# Patient Record
Sex: Female | Born: 1985 | Race: Black or African American | Hispanic: No | Marital: Single | Smoking: Current every day smoker
Health system: Southern US, Community
[De-identification: ages and names within clinical notes are randomized; demographics above are authoritative.]

## PROBLEM LIST (undated history)

## (undated) DIAGNOSIS — F909 Attention-deficit hyperactivity disorder, unspecified type: Secondary | ICD-10-CM

---
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
## 2007-02-26 ENCOUNTER — Emergency Department (HOSPITAL_COMMUNITY): Admission: EM | Admit: 2007-02-26 | Discharge: 2007-02-26 | Payer: Self-pay | Admitting: Emergency Medicine

## 2007-07-22 ENCOUNTER — Emergency Department (HOSPITAL_COMMUNITY): Admission: EM | Admit: 2007-07-22 | Discharge: 2007-07-23 | Payer: Self-pay | Admitting: Emergency Medicine

## 2007-11-04 ENCOUNTER — Emergency Department (HOSPITAL_COMMUNITY): Admission: EM | Admit: 2007-11-04 | Discharge: 2007-11-04 | Payer: Self-pay | Admitting: Emergency Medicine

## 2009-10-23 ENCOUNTER — Emergency Department (HOSPITAL_COMMUNITY): Admission: EM | Admit: 2009-10-23 | Discharge: 2009-10-23 | Payer: Self-pay | Admitting: Emergency Medicine

## 2010-05-23 ENCOUNTER — Emergency Department (HOSPITAL_COMMUNITY): Admission: EM | Admit: 2010-05-23 | Discharge: 2010-05-23 | Payer: Self-pay | Admitting: Emergency Medicine

## 2010-09-05 ENCOUNTER — Emergency Department (HOSPITAL_COMMUNITY): Admission: EM | Admit: 2010-09-05 | Discharge: 2010-09-05 | Payer: Self-pay | Admitting: Emergency Medicine

## 2011-01-07 LAB — COMPREHENSIVE METABOLIC PANEL
ALT: 15 U/L (ref 0–35)
AST: 18 U/L (ref 0–37)
Albumin: 3.7 g/dL (ref 3.5–5.2)
Calcium: 9.3 mg/dL (ref 8.4–10.5)
GFR calc Af Amer: >60 mL/min (ref >60)
Glucose, Bld: 79 mg/dL (ref 70–99)
Sodium: 140 mEq/L (ref 135–145)
Total Protein: 7.6 g/dL (ref 6.0–8.3)

## 2011-01-07 LAB — URINALYSIS, ROUTINE W REFLEX MICROSCOPIC
Glucose, UA: NEGATIVE mg/dL
Hgb urine dipstick: NEGATIVE
Ketones, ur: NEGATIVE mg/dL
pH: 5.5 (ref 5.0–8.0)

## 2011-05-28 ENCOUNTER — Emergency Department (HOSPITAL_COMMUNITY)
Admission: EM | Admit: 2011-05-28 | Discharge: 2011-05-28 | Disposition: A | Discharge status: Home / Self Care | Payer: Self-pay | Attending: Emergency Medicine | Admitting: Emergency Medicine

## 2011-05-28 DIAGNOSIS — A599 Trichomoniasis, unspecified: Secondary | ICD-10-CM | POA: Insufficient documentation

## 2011-05-28 DIAGNOSIS — D649 Anemia, unspecified: Secondary | ICD-10-CM | POA: Insufficient documentation

## 2011-05-28 DIAGNOSIS — N390 Urinary tract infection, site not specified: Secondary | ICD-10-CM | POA: Insufficient documentation

## 2011-05-28 DIAGNOSIS — F988 Other specified behavioral and emotional disorders with onset usually occurring in childhood and adolescence: Secondary | ICD-10-CM | POA: Insufficient documentation

## 2011-05-28 DIAGNOSIS — R1084 Generalized abdominal pain: Secondary | ICD-10-CM | POA: Insufficient documentation

## 2011-05-28 DIAGNOSIS — R110 Nausea: Secondary | ICD-10-CM | POA: Insufficient documentation

## 2011-05-28 DIAGNOSIS — R197 Diarrhea, unspecified: Secondary | ICD-10-CM | POA: Insufficient documentation

## 2011-05-28 LAB — COMPREHENSIVE METABOLIC PANEL
ALT: 10 U/L (ref 0–35)
AST: 12 U/L (ref 0–37)
Albumin: 3.5 g/dL (ref 3.5–5.2)
Alkaline Phosphatase: 72 U/L (ref 39–117)
BUN: 10 mg/dL (ref 6–23)
CO2: 27 mEq/L (ref 19–32)
Calcium: 9.4 mg/dL (ref 8.4–10.5)
Chloride: 104 mEq/L (ref 96–112)
Creatinine, Ser: 0.79 mg/dL (ref 0.50–1.10)
GFR calc Af Amer: >60 mL/min (ref >60)
GFR calc non Af Amer: >60 mL/min (ref >60)
Glucose, Bld: 88 mg/dL (ref 70–99)
Potassium: 3.7 mEq/L (ref 3.5–5.1)
Sodium: 138 mEq/L (ref 135–145)
Total Bilirubin: 0.4 mg/dL (ref 0.3–1.2)
Total Protein: 7.4 g/dL (ref 6.0–8.3)

## 2011-05-28 LAB — URINALYSIS, ROUTINE W REFLEX MICROSCOPIC
Glucose, UA: NEGATIVE mg/dL
Protein, ur: NEGATIVE mg/dL
Specific Gravity, Urine: 1.028 (ref 1.005–1.030)
Urobilinogen, UA: 0.2 mg/dL (ref 0.0–1.0)

## 2011-05-28 LAB — DIFFERENTIAL
Basophils Absolute: 0 10*3/uL (ref 0.0–0.1)
Basophils Relative: 0 % (ref 0–1)
Eosinophils Absolute: 0.5 10*3/uL (ref 0.0–0.7)
Eosinophils Relative: 7 % — ABNORMAL HIGH (ref 0–5)
Lymphocytes Relative: 34 % (ref 12–46)
Lymphs Abs: 2.6 10*3/uL (ref 0.7–4.0)
Monocytes Absolute: 0.5 10*3/uL (ref 0.1–1.0)
Monocytes Relative: 6 % (ref 3–12)
Neutro Abs: 4 10*3/uL (ref 1.7–7.7)
Neutrophils Relative %: 53 % (ref 43–77)

## 2011-05-28 LAB — CBC
HCT: 34.6 % — ABNORMAL LOW (ref 36.0–46.0)
Hemoglobin: 11.1 g/dL — ABNORMAL LOW (ref 12.0–15.0)
MCH: 21.6 pg — ABNORMAL LOW (ref 26.0–34.0)
MCHC: 32.1 g/dL (ref 30.0–36.0)
MCV: 67.4 fL — ABNORMAL LOW (ref 78.0–100.0)
Platelets: 202 10*3/uL (ref 150–400)
RBC: 5.13 MIL/uL — ABNORMAL HIGH (ref 3.87–5.11)
RDW: 16 % — ABNORMAL HIGH (ref 11.5–15.5)
WBC: 7.6 10*3/uL (ref 4.0–10.5)

## 2011-05-28 LAB — LIPASE, BLOOD: Lipase: 25 U/L (ref 11–59)

## 2011-05-28 LAB — URINE MICROSCOPIC-ADD ON

## 2011-07-17 LAB — URINALYSIS, ROUTINE W REFLEX MICROSCOPIC
Ketones, ur: NEGATIVE
Nitrite: NEGATIVE
Specific Gravity, Urine: 1.025
pH: 5

## 2011-07-17 LAB — URINE MICROSCOPIC-ADD ON

## 2011-11-05 ENCOUNTER — Emergency Department (HOSPITAL_COMMUNITY)
Admission: EM | Admit: 2011-11-05 | Discharge: 2011-11-05 | Disposition: A | Discharge status: Home / Self Care | Payer: Self-pay | Attending: Emergency Medicine | Admitting: Emergency Medicine

## 2011-11-05 ENCOUNTER — Encounter (HOSPITAL_COMMUNITY): Payer: Self-pay | Admitting: Neurology

## 2011-11-05 DIAGNOSIS — R109 Unspecified abdominal pain: Secondary | ICD-10-CM | POA: Insufficient documentation

## 2011-11-05 DIAGNOSIS — J111 Influenza due to unidentified influenza virus with other respiratory manifestations: Secondary | ICD-10-CM | POA: Insufficient documentation

## 2011-11-05 DIAGNOSIS — R197 Diarrhea, unspecified: Secondary | ICD-10-CM | POA: Insufficient documentation

## 2011-11-05 DIAGNOSIS — R10819 Abdominal tenderness, unspecified site: Secondary | ICD-10-CM | POA: Insufficient documentation

## 2011-11-05 DIAGNOSIS — R51 Headache: Secondary | ICD-10-CM | POA: Insufficient documentation

## 2011-11-05 DIAGNOSIS — R112 Nausea with vomiting, unspecified: Secondary | ICD-10-CM | POA: Insufficient documentation

## 2011-11-05 DIAGNOSIS — F17200 Nicotine dependence, unspecified, uncomplicated: Secondary | ICD-10-CM | POA: Insufficient documentation

## 2011-11-05 HISTORY — DX: Attention-deficit hyperactivity disorder, unspecified type: F90.9

## 2011-11-05 LAB — POCT I-STAT, CHEM 8
BUN: 12 mg/dL (ref 6–23)
Chloride: 107 mEq/L (ref 96–112)
Creatinine, Ser: 0.8 mg/dL (ref 0.50–1.10)
Glucose, Bld: 85 mg/dL (ref 70–99)
Potassium: 5.6 mEq/L — ABNORMAL HIGH (ref 3.5–5.1)
Sodium: 139 mEq/L (ref 135–145)

## 2011-11-05 LAB — URINALYSIS, ROUTINE W REFLEX MICROSCOPIC
Bilirubin Urine: NEGATIVE
Glucose, UA: NEGATIVE mg/dL
Hgb urine dipstick: NEGATIVE
Specific Gravity, Urine: 1.024 (ref 1.005–1.030)
pH: 5.5 (ref 5.0–8.0)

## 2011-11-05 LAB — DIFFERENTIAL
Basophils Absolute: 0.1 10*3/uL (ref 0.0–0.1)
Basophils Relative: 1 % (ref 0–1)
Eosinophils Relative: 11 % — ABNORMAL HIGH (ref 0–5)
Lymphs Abs: 2.2 10*3/uL (ref 0.7–4.0)
Monocytes Relative: 8 % (ref 3–12)
Neutro Abs: 2.7 10*3/uL (ref 1.7–7.7)

## 2011-11-05 LAB — CBC
HCT: 38.1 % (ref 36.0–46.0)
Hemoglobin: 12.1 g/dL (ref 12.0–15.0)
MCV: 68.2 fL — ABNORMAL LOW (ref 78.0–100.0)
RDW: 16.6 % — ABNORMAL HIGH (ref 11.5–15.5)
WBC: 6.2 10*3/uL (ref 4.0–10.5)

## 2011-11-05 LAB — URINE MICROSCOPIC-ADD ON

## 2011-11-05 MED ORDER — ONDANSETRON HCL 4 MG PO TABS: 4.0000 mg | ORAL_TABLET | Freq: Four times a day (QID) | ORAL | Status: AC

## 2011-11-05 MED ORDER — ONDANSETRON HCL 4 MG/2ML IJ SOLN
4.0000 mg | Freq: Once | INTRAMUSCULAR | Status: AC
  Administered 2011-11-05: 4 mg via INTRAVENOUS
  Filled 2011-11-05: qty 2

## 2011-11-05 MED ORDER — LOPERAMIDE HCL 2 MG PO CAPS: 2.0000 mg | ORAL_CAPSULE | Freq: Four times a day (QID) | ORAL | Status: AC | PRN

## 2011-11-05 MED ORDER — FAMOTIDINE IN NACL 20-0.9 MG/50ML-% IV SOLN
20.0000 mg | Freq: Once | INTRAVENOUS | Status: AC
  Administered 2011-11-05: 20 mg via INTRAVENOUS
  Filled 2011-11-05: qty 50

## 2011-11-05 MED ORDER — SODIUM CHLORIDE 0.9 % IV BOLUS (SEPSIS)
1000.0000 mL | Freq: Once | INTRAVENOUS | Status: AC
  Administered 2011-11-05: 1000 mL via INTRAVENOUS

## 2011-11-05 MED ORDER — IBUPROFEN 800 MG PO TABS
800.0000 mg | ORAL_TABLET | Freq: Once | ORAL | Status: AC
  Administered 2011-11-05: 800 mg via ORAL

## 2011-11-05 MED ORDER — IBUPROFEN 800 MG PO TABS
ORAL_TABLET | ORAL | Status: AC
  Filled 2011-11-05: qty 1

## 2011-11-05 NOTE — ED Notes (Signed)
Pt reports drank spoiled milk on Monday, since then c/o nausea, vomiting, headache, diarrhea. Last vomited earlier this morning, confirms diarrhea today. Today c/o headache, nausea, abdominal pain. Pt alert and oriented. NAD

## 2011-11-05 NOTE — ED Provider Notes (Signed)
History     CSN: [id number]  Arrival date & time 11/05/11  0848   First MD Initiated Contact with Patient 11/05/11 [phone]      Chief Complaint  Patient presents with  . Headache  . Abdominal Pain  . Nausea    (Consider location/radiation/quality/duration/timing/severity/associated sxs/prior treatment) Patient is a 26 y.o. female presenting with headaches and abdominal pain. The history is provided by the patient. No language interpreter was used.  Headache  This is a new problem. The current episode started 2 days ago. The problem occurs every few hours. The problem has been rapidly worsening. Associated with: drank sour milk. The pain is at a severity of 2/10. The pain is mild. The pain does not radiate. Associated symptoms include nausea and vomiting. Pertinent negatives include no fever, no near-syncope and no shortness of breath. She has tried acetaminophen for the symptoms.  Abdominal Pain The primary symptoms of the illness include abdominal pain, nausea and vomiting. The primary symptoms of the illness do not include fever or shortness of breath.   26 year old patient here today complaining of nausea vomiting and diarrhea x2 days approximately 3 times a day for each. States that she drank some sour milk 2 days ago and has not felt normal since. Having some epigastric pain 3/ 10  Especially after vomiting.  Patient has no PCP presently.denies blood in her stool or vomitus. States that she took one Tylenol yesterday that did not help her pain. States that she also has a headache with no photophobia or phonophobia, fever or neck pain. Last time she drank was yesterday denies any dysuria or vaginal discharge.    Past Medical History  Diagnosis Date  . Asthma   . ADHD (attention deficit hyperactivity disorder)     History reviewed. No pertinent past surgical history.  No family history on file.  History  Substance Use Topics  . Smoking status: Current Some Day Smoker  . Smokeless  tobacco: Not on file  . Alcohol Use: No    OB History    Grav Para Term Preterm Abortions TAB SAB Ect Mult Living                  Review of Systems  Constitutional: Negative for fever.  Respiratory: Negative for shortness of breath.   Cardiovascular: Negative for near-syncope.  Gastrointestinal: Positive for nausea, vomiting and abdominal pain.  Neurological: Positive for headaches.  All other systems reviewed and are negative.    Allergies  Review of patient's allergies indicates no known allergies.  Home Medications   Current Outpatient Rx  Name Route Sig Dispense Refill  . LOPERAMIDE HCL 2 MG PO CAPS Oral Take 1 capsule (2 mg total) by mouth 4 (four) times daily as needed for diarrhea or loose stools. 12 capsule 0  . ONDANSETRON HCL 4 MG PO TABS Oral Take 1 tablet (4 mg total) by mouth every 6 (six) hours. 12 tablet 0    BP 102/63  Pulse 78  Temp(Src) 98.3 F (36.8 C) (Oral)  Resp 16  SpO2 99%  LMP 10/13/2011  Physical Exam  Nursing note and vitals reviewed. Constitutional: She is oriented to person, place, and time. She appears well-developed and well-nourished.  HENT:  Head: Normocephalic.  Eyes: Pupils are equal, round, and reactive to light.  Neck: Normal range of motion. Neck supple.  Cardiovascular: Normal rate.  Exam reveals no gallop and no friction rub.   No murmur heard. Pulmonary/Chest: Effort normal and breath sounds normal.  Abdominal: Soft. She exhibits no distension. There is tenderness. There is no rebound.  Musculoskeletal: Normal range of motion. She exhibits no edema and no tenderness.  Neurological: She is oriented to person, place, and time.  Skin: Skin is warm and dry. No rash noted.  Psychiatric: She has a normal mood and affect.    ED Course  Procedures (including critical care time)  Labs Reviewed  CBC - Abnormal; Notable for the following:    RBC 5.59 (*)    MCV 68.2 (*)    MCH 21.6 (*)    RDW 16.6 (*)    All other  components within normal limits  DIFFERENTIAL - Abnormal; Notable for the following:    Eosinophils Relative 11 (*)    All other components within normal limits  URINALYSIS, ROUTINE W REFLEX MICROSCOPIC - Abnormal; Notable for the following:    APPearance CLOUDY (*)    Leukocytes, UA SMALL (*)    All other components within normal limits  POCT I-STAT, CHEM 8 - Abnormal; Notable for the following:    Potassium 5.6 (*)    Calcium, Ion 1.10 (*)    All other components within normal limits  URINE MICROSCOPIC-ADD ON - Abnormal; Notable for the following:    Squamous Epithelial / LPF MANY (*)    All other components within normal limits  POCT PREGNANCY, URINE  POTASSIUM  I-STAT, CHEM 8  POCT PREGNANCY, URINE  POTASSIUM      1. Influenza       MDM  12:42 PM awaiting for a redraw of her potassium which was drawn almost 2 hours ago. The lab states it was never run they're running a stat right now and we'll call the results. negative 12:42 PM Delay due to conflicting potassium lab values.  First one was high and second one not back but lab called and states that it is reading 0.  Will redraw labs.  12:42 PM   Potassium redraw results called 4.1.  Will discharge with flu-like symptoms. Labs Reviewed  CBC - Abnormal; Notable for the following:    RBC 5.59 (*)    MCV 68.2 (*)    MCH 21.6 (*)    RDW 16.6 (*)    All other components within normal limits  DIFFERENTIAL - Abnormal; Notable for the following:    Eosinophils Relative 11 (*)    All other components within normal limits  URINALYSIS, ROUTINE W REFLEX MICROSCOPIC - Abnormal; Notable for the following:    APPearance CLOUDY (*)    Leukocytes, UA SMALL (*)    All other components within normal limits  POCT I-STAT, CHEM 8 - Abnormal; Notable for the following:    Potassium 5.6 (*)    Calcium, Ion 1.10 (*)    All other components within normal limits  URINE MICROSCOPIC-ADD ON - Abnormal; Notable for the following:     Squamous Epithelial / LPF MANY (*)    All other components within normal limits  POCT PREGNANCY, URINE  POTASSIUM  I-STAT, CHEM 8  POCT PREGNANCY, URINE  POTASSIUM    Medical screening examination/treatment/procedure(s) were performed by non-physician practitioner and as supervising physician I was immediately available for consultation/collaboration. Osvaldo Human, M.D.    Jethro Bastos, NP 11/05/11 1243  Carleene Cooper III, MD 11/05/11 [phone]

## 2011-11-05 NOTE — ED Notes (Signed)
Okey Dupre, NP notified of abnormal lab test results

## 2012-03-24 ENCOUNTER — Emergency Department (HOSPITAL_COMMUNITY): Payer: Self-pay

## 2012-03-24 ENCOUNTER — Emergency Department (HOSPITAL_COMMUNITY)
Admission: EM | Admit: 2012-03-24 | Discharge: 2012-03-24 | Disposition: A | Discharge status: Home / Self Care | Payer: Self-pay | Attending: Emergency Medicine | Admitting: Emergency Medicine

## 2012-03-24 ENCOUNTER — Encounter (HOSPITAL_COMMUNITY): Payer: Self-pay | Admitting: Emergency Medicine

## 2012-03-24 DIAGNOSIS — J45909 Unspecified asthma, uncomplicated: Secondary | ICD-10-CM | POA: Insufficient documentation

## 2012-03-24 DIAGNOSIS — M62838 Other muscle spasm: Secondary | ICD-10-CM | POA: Insufficient documentation

## 2012-03-24 DIAGNOSIS — F909 Attention-deficit hyperactivity disorder, unspecified type: Secondary | ICD-10-CM | POA: Insufficient documentation

## 2012-03-24 DIAGNOSIS — M79609 Pain in unspecified limb: Secondary | ICD-10-CM | POA: Insufficient documentation

## 2012-03-24 DIAGNOSIS — M25519 Pain in unspecified shoulder: Secondary | ICD-10-CM | POA: Insufficient documentation

## 2012-03-24 MED ORDER — IBUPROFEN 800 MG PO TABS: 800.0000 mg | ORAL_TABLET | Freq: Three times a day (TID) | ORAL | Status: AC

## 2012-03-24 MED ORDER — IBUPROFEN 800 MG PO TABS
800.0000 mg | ORAL_TABLET | Freq: Once | ORAL | Status: AC
  Administered 2012-03-24: 800 mg via ORAL
  Filled 2012-03-24: qty 1

## 2012-03-24 NOTE — Progress Notes (Signed)
Orthopedic Tech Progress Note Patient Details:  Amy Sexton 02/11/1986 [id number]  Ortho Devices Type of Ortho Device: Arm foam sling Ortho Device/Splint Interventions: Application   Cammer, Mickie Bail 03/24/2012, 10:42 AM
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
## 2012-03-24 NOTE — Discharge Instructions (Signed)
Acromioclavicular Injuries  The AC (acromioclavicular) joint is the joint in the shoulder where the collarbone (clavicle) meets the shoulder blade (scapula). The part of the shoulder blade connected to the collarbone is called the acromion. Common problems with and treatments for the AC joint are detailed below.  ARTHRITIS  Arthritis occurs when the joint has been injured and the smooth padding between the joints (cartilage) is lost. This is the wear and tear seen in most joints of the body if they have been overused. This causes the joint to produce pain and swelling which is worse with activity.   AC JOINT SEPARATION  AC joint separation means that the ligaments connecting the acromion of the shoulder blade and collarbone have been damaged, and the two bones no longer line up. AC separations can be anywhere from mild to severe, and are "graded" depending upon which ligaments are torn and how badly they are torn.   Grade I Injury: the least damage is done, and the AC joint still lines up.   Grade II Injury: damage to the ligaments which reinforce the AC joint. In a Grade II injury, these ligaments are stretched but not entirely torn. When stressed, the AC joint becomes painful and unstable.   Grade III Injury: AC and secondary ligaments are completely torn, and the collarbone is no longer attached to the shoulder blade. This results in deformity; a prominence of the end of the clavicle.  AC JOINT FRACTURE  AC joint fracture means that there has been a break in the bones of the AC joint, usually the end of the clavicle.  TREATMENT  TREATMENT OF AC ARTHRITIS   There is currently no way to replace the cartilage damaged by arthritis. The best way to improve the condition is to decrease the activities which aggravate the problem. Application of ice to the joint helps decrease pain and soreness (inflammation). The use of non-steroidal anti-inflammatory medication is helpful.   If less conservative measures do not  work, then cortisone shots (injections) may be used. These are anti-inflammatories; they decrease the soreness in the joint and swelling.   If non-surgical measures fail, surgery may be recommended. The procedure is generally removal of a portion of the end of the clavicle. This is the part of the collarbone closest to your acromion which is stabilized with ligaments to the acromion of the shoulder blade. This surgery may be performed using a tube-like instrument with a light (arthroscope) for looking into a joint. It may also be performed as an open surgery through a small incision by the surgeon. Most patients will have good range of motion within 6 weeks and may return to all activity including sports by 8-12 weeks, barring complications.  TREATMENT OF AN AC SEPARATION   The initial treatment is to decrease pain. This is best accomplished by immobilizing the arm in a sling and placing an ice pack to the shoulder for 20 to 30 minutes every 2 hours as needed. As the pain starts to subside, it is important to begin moving the fingers, wrist, elbow and eventually the shoulder in order to prevent a stiff or "frozen" shoulder. Instruction on when and how much to move the shoulder will be provided by your caregiver. The length of time needed to regain full motion and function depends on the amount or grade of the injury. Recovery from a Grade I AC separation usually takes 10 to 14 days, whereas a Grade III may take 6 to 8 weeks.   Grade   I and II separations usually do not require surgery. Even Grade III injuries usually allow return to full activity with few restrictions. Treatment is also based on the activity demands of the injured shoulder. For example, a high level quarterback with an injured throwing arm will receive more aggressive treatment than someone with a desk job who rarely uses his/her arm for strenuous activities. In some cases, a painful lump may persist which could require a later surgery. Surgery  can be very successful, but the benefits must be weighed against the potential risks.  TREATMENT OF AN AC JOINT FRACTURE  Fracture treatment depends on the type of fracture. Sometimes a splint or sling may be all that is required. Other times surgery may be required for repair. This is more frequently the case when the ligaments supporting the clavicle are completely torn. Your caregiver will help you with these decisions and together you can decide what will be the best treatment.  HOME CARE INSTRUCTIONS    Apply ice to the injury for 15 to 20 minutes each hour while awake for 2 days. Put the ice in a plastic bag and place a towel between the bag of ice and skin.   If a sling has been applied, wear it constantly for as long as directed by your caregiver, even at night. The sling or splint can be removed for bathing or showering or as directed. Be sure to keep the shoulder in the same place as when the sling is on. Do not lift the arm.   If a figure-of-eight splint has been applied it should be tightened gently by another person every day. Tighten it enough to keep the shoulders held back. Allow enough room to place the index finger between the body and strap. Loosen the splint immediately if there is numbness or tingling in the hands.   Take over-the-counter or prescription medicines for pain, discomfort or fever as directed by your caregiver.   If you or your child has received a follow up appointment, it is very important to keep that appointment in order to avoid long term complications, chronic pain or disability.  SEEK MEDICAL CARE IF:    The pain is not relieved with medications.   There is increased swelling or discoloration that continues to get worse rather than better.   You or your child has been unable to follow up as instructed.   There is progressive numbness and tingling in the arm, forearm or hand.  SEEK IMMEDIATE MEDICAL CARE IF:    The arm is numb, cold or pale.   There is increasing  pain in the hand, forearm or fingers.  MAKE SURE YOU:    Understand these instructions.   Will watch your condition.   Will get help right away if you are not doing well or get worse.  Document Released: 07/23/2005 Document Revised: 10/02/2011 Document Reviewed: 01/15/2009  [employer] Patient Information 2012 [employer]
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
## 2012-03-29 NOTE — ED Provider Notes (Signed)
Medical screening examination/treatment/procedure(s) were performed by non-physician practitioner and as supervising physician I was immediately available for consultation/collaboration.  Cyndra Numbers, MD 03/29/12 [phone]

## 2013-01-30 ENCOUNTER — Encounter (HOSPITAL_COMMUNITY): Payer: Self-pay | Admitting: Physical Medicine and Rehabilitation

## 2013-01-30 ENCOUNTER — Emergency Department (HOSPITAL_COMMUNITY): Payer: Self-pay

## 2013-01-30 ENCOUNTER — Emergency Department (HOSPITAL_COMMUNITY)
Admission: EM | Admit: 2013-01-30 | Discharge: 2013-01-30 | Disposition: A | Discharge status: Home / Self Care | Payer: Self-pay | Attending: Emergency Medicine | Admitting: Emergency Medicine

## 2013-01-30 DIAGNOSIS — Y9389 Activity, other specified: Secondary | ICD-10-CM | POA: Insufficient documentation

## 2013-01-30 DIAGNOSIS — J45909 Unspecified asthma, uncomplicated: Secondary | ICD-10-CM | POA: Insufficient documentation

## 2013-01-30 DIAGNOSIS — IMO0002 Reserved for concepts with insufficient information to code with codable children: Secondary | ICD-10-CM | POA: Insufficient documentation

## 2013-01-30 DIAGNOSIS — S6990XA Unspecified injury of unspecified wrist, hand and finger(s), initial encounter: Secondary | ICD-10-CM | POA: Insufficient documentation

## 2013-01-30 DIAGNOSIS — M79642 Pain in left hand: Secondary | ICD-10-CM

## 2013-01-30 DIAGNOSIS — F17200 Nicotine dependence, unspecified, uncomplicated: Secondary | ICD-10-CM | POA: Insufficient documentation

## 2013-01-30 DIAGNOSIS — Y929 Unspecified place or not applicable: Secondary | ICD-10-CM | POA: Insufficient documentation

## 2013-01-30 DIAGNOSIS — Z8659 Personal history of other mental and behavioral disorders: Secondary | ICD-10-CM | POA: Insufficient documentation

## 2013-01-30 DIAGNOSIS — Z23 Encounter for immunization: Secondary | ICD-10-CM | POA: Insufficient documentation

## 2013-01-30 DIAGNOSIS — X58XXXA Exposure to other specified factors, initial encounter: Secondary | ICD-10-CM | POA: Insufficient documentation

## 2013-01-30 MED ORDER — NAPROXEN SODIUM 220 MG PO TABS: 220.0000 mg | ORAL_TABLET | Freq: Two times a day (BID) | ORAL | Status: DC

## 2013-01-30 MED ORDER — TETANUS-DIPHTH-ACELL PERTUSSIS 5-2.5-18.5 LF-MCG/0.5 IM SUSP
0.5000 mL | Freq: Once | INTRAMUSCULAR | Status: AC
  Administered 2013-01-30: 0.5 mL via INTRAMUSCULAR
  Filled 2013-01-30: qty 0.5

## 2013-01-30 NOTE — ED Provider Notes (Signed)
History     CSN: [id number]  Arrival date & time 01/30/13  4098   First MD Initiated Contact with Patient 01/30/13 [phone]      Chief Complaint  Patient presents with  . Hand Pain    (Consider location/radiation/quality/duration/timing/severity/associated sxs/prior treatment) HPI Comments: Patient p/w pain in her left hand and 4th finger that began two days ago.  Pain is located over dorsal and palmar surfaces of hand, throughout 4th finger and 4th metacarpal, worse with full flexion of finger.  States she cut the tip of her finger AFTER the pain began (she reports she is positive about this timeline).  Pain is described as throbbing, does not radiate, 8/10 intensity.  Denies weakness or numbness.  Denies swelling. Denies wrist pain.  Unsure of last tetanus.  States she does heavy lifting at work but does not remember specific incident that might have caused injury.   Patient is a 27 y.o. female presenting with hand pain. The history is provided by the patient.  Hand Pain Pertinent negatives include no joint swelling, numbness, rash or weakness.    Past Medical History  Diagnosis Date  . Asthma   . ADHD (attention deficit hyperactivity disorder)     No past surgical history on file.  No family history on file.  History  Substance Use Topics  . Smoking status: Current Some Day Smoker  . Smokeless tobacco: Not on file  . Alcohol Use: No    OB History   Grav Para Term Preterm Abortions TAB SAB Ect Mult Living                  Review of Systems  Musculoskeletal: Negative for joint swelling.  Skin: Positive for wound. Negative for color change, pallor and rash.  Neurological: Negative for weakness and numbness.    Allergies  Review of patient's allergies indicates no known allergies.  Home Medications  No current outpatient prescriptions on file.  BP 133/90  Pulse 82  Temp(Src) 98.7 F (37.1 C) (Oral)  Resp 18  SpO2 99%  Physical Exam  Nursing note and vitals  reviewed. Constitutional: She appears well-developed and well-nourished. No distress.  HENT:  Head: Normocephalic and atraumatic.  Neck: Neck supple.  Pulmonary/Chest: Effort normal.  Musculoskeletal:       Hands: Pt with full active flexion and extension of 4th finger.  Pain increases with passive full flexion of finger.  Sensation intact, capillary refill < 2 seconds.  No erythema, edema, or warmth.    Neurological: She is alert.  Skin: She is not diaphoretic.    ED Course  Procedures (including critical care time)  Labs Reviewed - No data to display Dg Hand Complete Left  01/30/2013  *RADIOLOGY REPORT*  Clinical Data: Left hand pain.  LEFT HAND - COMPLETE 3+ VIEW  Comparison:  None.  Findings:  There is no evidence of fracture or dislocation.  There is no evidence of arthropathy or other focal bone abnormality. Soft tissues are unremarkable.  IMPRESSION: Negative.   Original Report Authenticated By: Irish Lack, M.D.     8:59 AM Pt seen and examined.  Mildly tender diffusely through 4th finger and metacarpal.  No e/o infection. Pt declines medication currently.  Xray pending.   1. Left hand pain     MDM  Pt with mild tenderness over dorsal and palmar aspect of left hand, following 4th metacarpal into proximal digit.  No inflammation over joint.  No erythema, edema, warmth.  Doubt infection including tenosynovitis.  Doubt septic joint. Pt does have small abrasion distally on same digit but this does not appear infected either.   Pt has full AROM of digits.  Will place in ACE wrap for comfort.  Discussed all results with patient.  Pt given return precautions.  Pt verbalizes understanding and agrees with plan.           Trixie Dredge, PA-C 01/30/13 1044

## 2013-01-30 NOTE — ED Notes (Signed)
Pt presents to department for evaluation of L hand and ring finger pain. Onset Friday. States she is unsure if extremity was injured while lifting heavy equipment at work. 8/10 pain upon arrival. CMS intact. Pt is alert and oriented x4. Ambulatory to triage.

## 2013-01-31 NOTE — ED Provider Notes (Signed)
Medical screening examination/treatment/procedure(s) were performed by non-physician practitioner and as supervising physician I was immediately available for consultation/collaboration.   Carleene Cooper III, MD 01/31/13 1226

## 2013-06-27 ENCOUNTER — Emergency Department (HOSPITAL_COMMUNITY)
Admission: EM | Admit: 2013-06-27 | Discharge: 2013-06-27 | Disposition: A | Discharge status: Home / Self Care | Payer: Self-pay | Attending: Emergency Medicine | Admitting: Emergency Medicine

## 2013-06-27 ENCOUNTER — Emergency Department (HOSPITAL_COMMUNITY): Payer: Self-pay

## 2013-06-27 ENCOUNTER — Encounter (HOSPITAL_COMMUNITY): Payer: Self-pay | Admitting: *Deleted

## 2013-06-27 DIAGNOSIS — J45909 Unspecified asthma, uncomplicated: Secondary | ICD-10-CM | POA: Insufficient documentation

## 2013-06-27 DIAGNOSIS — Z8659 Personal history of other mental and behavioral disorders: Secondary | ICD-10-CM | POA: Insufficient documentation

## 2013-06-27 DIAGNOSIS — R51 Headache: Secondary | ICD-10-CM | POA: Insufficient documentation

## 2013-06-27 DIAGNOSIS — Z3202 Encounter for pregnancy test, result negative: Secondary | ICD-10-CM | POA: Insufficient documentation

## 2013-06-27 DIAGNOSIS — R079 Chest pain, unspecified: Secondary | ICD-10-CM

## 2013-06-27 DIAGNOSIS — R519 Headache, unspecified: Secondary | ICD-10-CM

## 2013-06-27 DIAGNOSIS — R072 Precordial pain: Secondary | ICD-10-CM | POA: Insufficient documentation

## 2013-06-27 DIAGNOSIS — F17200 Nicotine dependence, unspecified, uncomplicated: Secondary | ICD-10-CM | POA: Insufficient documentation

## 2013-06-27 LAB — CBC
HCT: 36.8 % (ref 36.0–46.0)
Hemoglobin: 12.3 g/dL (ref 12.0–15.0)
MCH: 22.8 pg — ABNORMAL LOW (ref 26.0–34.0)
MCHC: 33.4 g/dL (ref 30.0–36.0)
RDW: 16.4 % — ABNORMAL HIGH (ref 11.5–15.5)

## 2013-06-27 LAB — BASIC METABOLIC PANEL
BUN: 6 mg/dL (ref 6–23)
Calcium: 9.3 mg/dL (ref 8.4–10.5)
Creatinine, Ser: 0.9 mg/dL (ref 0.50–1.10)
GFR calc Af Amer: >90 mL/min (ref >90)
GFR calc non Af Amer: 86 mL/min — ABNORMAL LOW (ref >90)
Glucose, Bld: 100 mg/dL — ABNORMAL HIGH (ref 70–99)

## 2013-06-27 MED ORDER — METOCLOPRAMIDE HCL 5 MG/ML IJ SOLN
10.0000 mg | Freq: Once | INTRAMUSCULAR | Status: AC
  Administered 2013-06-27: 10 mg via INTRAVENOUS
  Filled 2013-06-27: qty 2

## 2013-06-27 MED ORDER — DIPHENHYDRAMINE HCL 50 MG/ML IJ SOLN
25.0000 mg | Freq: Once | INTRAMUSCULAR | Status: AC
  Administered 2013-06-27: 25 mg via INTRAVENOUS
  Filled 2013-06-27: qty 1

## 2013-06-27 MED ORDER — KETOROLAC TROMETHAMINE 30 MG/ML IJ SOLN
30.0000 mg | Freq: Once | INTRAMUSCULAR | Status: AC
  Administered 2013-06-27: 30 mg via INTRAVENOUS
  Filled 2013-06-27: qty 1

## 2013-06-27 NOTE — ED Provider Notes (Signed)
CSN: [id number]     Arrival date & time 06/27/13  1322 History   First MD Initiated Contact with Patient 06/27/13 1431     Chief Complaint  Patient presents with  . Chest Pain  . Headache   (Consider location/radiation/quality/duration/timing/severity/associated sxs/prior Treatment) HPI Comments: 27 yo female with intermittent chest ache for 4 days, lasting minutes to hours.  Non radiating.  Pt has had similar in the past. NO known heart or lung hx.  Patient denies blood clot history, active cancer, recent major trauma or surgery, unilateral leg swelling/ pain, recent long travel, hemoptysis or oral contraceptives.  No diaphoresis or exertional sxs.  Mild frontal HA, intermittent, similar to previous ha hx, gradual onset.  Mild worsening at times with eating.  Pt feels well otherwise. HA and cp do not occur together, no neck pain.  Patient is a 27 y.o. female presenting with chest pain and headaches. The history is provided by the patient.  Chest Pain Pain location:  Substernal area Associated symptoms: headache   Associated symptoms: no abdominal pain, no back pain, no fever, no numbness, no shortness of breath, not vomiting and no weakness   Headache Associated symptoms: no abdominal pain, no back pain, no fever, no neck pain, no neck stiffness, no numbness and no vomiting     Past Medical History  Diagnosis Date  . Asthma   . ADHD (attention deficit hyperactivity disorder)    History reviewed. No pertinent past surgical history. No family history on file. History  Substance Use Topics  . Smoking status: Current Some Day Smoker  . Smokeless tobacco: Not on file  . Alcohol Use: No   OB History   Grav Para Term Preterm Abortions TAB SAB Ect Mult Living                 Review of Systems  Constitutional: Negative for fever and chills.  HENT: Negative for neck pain and neck stiffness.   Eyes: Negative for visual disturbance.  Respiratory: Negative for shortness of breath.    Cardiovascular: Positive for chest pain.  Gastrointestinal: Negative for vomiting and abdominal pain.  Genitourinary: Negative for dysuria and flank pain.  Musculoskeletal: Negative for back pain.  Skin: Negative for rash.  Neurological: Positive for headaches. Negative for weakness, light-headedness and numbness.    Allergies  Review of patient's allergies indicates no known allergies.  Home Medications  No current outpatient prescriptions on file. BP 124/81  Pulse 86  Temp(Src) 98.3 F (36.8 C) (Oral)  Resp 18  SpO2 99%  LMP 06/20/2013 Physical Exam  Nursing note and vitals reviewed. Constitutional: She is oriented to person, place, and time. She appears well-developed and well-nourished.  HENT:  Head: Normocephalic and atraumatic.  Eyes: Conjunctivae are normal. Right eye exhibits no discharge. Left eye exhibits no discharge.  Neck: Normal range of motion. Neck supple. No tracheal deviation present.  Cardiovascular: Normal rate, regular rhythm and intact distal pulses.   Pulmonary/Chest: Effort normal and breath sounds normal.  Abdominal: Soft. She exhibits no distension. There is no tenderness. There is no guarding.  Musculoskeletal: She exhibits no edema.  Neurological: She is alert and oriented to person, place, and time. She has normal strength. No cranial nerve deficit or sensory deficit. Coordination normal. GCS eye subscore is 4. GCS verbal subscore is 5. GCS motor subscore is 6.  Skin: Skin is warm. No rash noted.  Psychiatric: She has a normal mood and affect.    ED Course  Procedures (including critical  care time) Labs Review Labs Reviewed  CBC - Abnormal; Notable for the following:    RBC 5.40 (*)    MCV 68.1 (*)    MCH 22.8 (*)    RDW 16.4 (*)    All other components within normal limits  BASIC METABOLIC PANEL - Abnormal; Notable for the following:    Glucose, Bld 100 (*)    GFR calc non Af Amer 86 (*)    All other components within normal limits   TROPONIN I  POCT I-STAT TROPONIN I   Imaging Review Dg Chest 2 View  06/27/2013   *RADIOLOGY REPORT*  Clinical Data: Chest pain and had a  CHEST - 2 VIEW  Comparison: 09/05/2010  Findings:  The heart size and vascular pattern are normal.  The lungs are clear.  No change from prior study.  IMPRESSION: Negative   Original Report Authenticated By: Esperanza Heir, M.D.    MDM  No diagnosis found. No FH of cardiac/  Well appearing.  Atypical sxs.  Pt very low risk cardiac and PE. PERC neg, no indication for CT.  No sxs in ED.   EKG and cxr not acute findings, reviewed.  Date: 06/27/2013  Rate: 77  Rhythm: normal sinus rhythm  QRS Axis: normal  Intervals: normal  ST/T Wave abnormalities: nonspecific ST changes  Conduction Disutrbances:none  Narrative Interpretation:   Old EKG Reviewed: none available  Plan for close outpt fup discussed.      Enid Skeens, MD 06/27/13 1540

## 2013-06-27 NOTE — ED Notes (Signed)
Pt is here with chest pain for 4 days intermittently.  Pt states pain to mid chest and no radiation.  Pt reports migraine pain intermittently with left eye bothering her.  Pt reports left eye is blurry.

## 2013-08-19 ENCOUNTER — Other Ambulatory Visit: Payer: Self-pay | Admitting: Internal Medicine

## 2013-08-19 ENCOUNTER — Ambulatory Visit
Admission: RE | Admit: 2013-08-19 | Discharge: 2013-08-19 | Disposition: A | Discharge status: Home / Self Care | Payer: Worker's Compensation | Source: Ambulatory Visit | Attending: Internal Medicine | Admitting: Internal Medicine

## 2013-08-19 DIAGNOSIS — S060X9A Concussion with loss of consciousness of unspecified duration, initial encounter: Secondary | ICD-10-CM

## 2013-08-19 DIAGNOSIS — M5412 Radiculopathy, cervical region: Secondary | ICD-10-CM

## 2013-10-10 ENCOUNTER — Emergency Department (HOSPITAL_COMMUNITY)
Admission: EM | Admit: 2013-10-10 | Discharge: 2013-10-10 | Disposition: A | Discharge status: Home / Self Care | Payer: Self-pay | Attending: Emergency Medicine | Admitting: Emergency Medicine

## 2013-10-10 ENCOUNTER — Emergency Department (HOSPITAL_COMMUNITY): Payer: Self-pay

## 2013-10-10 ENCOUNTER — Encounter (HOSPITAL_COMMUNITY): Payer: Self-pay | Admitting: Emergency Medicine

## 2013-10-10 DIAGNOSIS — F17200 Nicotine dependence, unspecified, uncomplicated: Secondary | ICD-10-CM | POA: Insufficient documentation

## 2013-10-10 DIAGNOSIS — M940 Chondrocostal junction syndrome [Tietze]: Secondary | ICD-10-CM | POA: Insufficient documentation

## 2013-10-10 DIAGNOSIS — Z79899 Other long term (current) drug therapy: Secondary | ICD-10-CM | POA: Insufficient documentation

## 2013-10-10 DIAGNOSIS — F909 Attention-deficit hyperactivity disorder, unspecified type: Secondary | ICD-10-CM | POA: Insufficient documentation

## 2013-10-10 DIAGNOSIS — J45909 Unspecified asthma, uncomplicated: Secondary | ICD-10-CM | POA: Insufficient documentation

## 2013-10-10 DIAGNOSIS — J069 Acute upper respiratory infection, unspecified: Secondary | ICD-10-CM | POA: Insufficient documentation

## 2013-10-10 LAB — CBC
HCT: 37.7 % (ref 36.0–46.0)
MCHC: 32.4 g/dL (ref 30.0–36.0)
MCV: 67.9 fL — ABNORMAL LOW (ref 78.0–100.0)
RDW: 16.6 % — ABNORMAL HIGH (ref 11.5–15.5)
WBC: 6 10*3/uL (ref 4.0–10.5)

## 2013-10-10 LAB — BASIC METABOLIC PANEL
BUN: 9 mg/dL (ref 6–23)
Creatinine, Ser: 0.84 mg/dL (ref 0.50–1.10)
GFR calc Af Amer: >90 mL/min (ref >90)
GFR calc non Af Amer: >90 mL/min (ref >90)

## 2013-10-10 MED ORDER — ALBUTEROL SULFATE HFA 108 (90 BASE) MCG/ACT IN AERS
2.0000 | INHALATION_SPRAY | Freq: Once | RESPIRATORY_TRACT | Status: AC
  Administered 2013-10-10: 2 via RESPIRATORY_TRACT
  Filled 2013-10-10: qty 6.7

## 2013-10-10 MED ORDER — KETOROLAC TROMETHAMINE 30 MG/ML IJ SOLN: 30.0000 mg | Freq: Once | INTRAMUSCULAR | Status: DC

## 2013-10-10 MED ORDER — KETOROLAC TROMETHAMINE 30 MG/ML IJ SOLN
60.0000 mg | Freq: Once | INTRAMUSCULAR | Status: AC
  Administered 2013-10-10: 60 mg via INTRAMUSCULAR
  Filled 2013-10-10: qty 2

## 2013-10-10 MED ORDER — IBUPROFEN 600 MG PO TABS: 600.0000 mg | ORAL_TABLET | Freq: Four times a day (QID) | ORAL | Status: DC | PRN

## 2013-10-10 NOTE — ED Notes (Signed)
Pt arrived from triage to room 33

## 2013-10-10 NOTE — ED Notes (Signed)
Pt c/o intermittent SSCP x 2 months but pain has been constant past 3 days. Denies SOB, n/v, diaphoresis, fever, chills. Does report pain worse with deep breaths, coughing & is tender to palpation

## 2013-10-10 NOTE — ED Provider Notes (Signed)
CSN: [id number]     Arrival date & time 10/10/13  1347 History   First MD Initiated Contact with Patient 10/10/13 1818     Chief Complaint  Patient presents with  . Chest Pain   (Consider location/radiation/quality/duration/timing/severity/associated sxs/prior Treatment) HPI Amy Sexton is a 27 y.o. female who presents to the emergency department for concern of chest pain.  Patient reports that this has been going on and off for the last month.  Tends to be worse with deep breathing or coughing.  Better with nothing.  Nonexertional.  Sharp.  8/10.  No SOB.  No leg swelling.  No hemoptysis.  No history of DVT/PE.  No other symptoms.  Past Medical History  Diagnosis Date  . Asthma   . ADHD (attention deficit hyperactivity disorder)    History reviewed. No pertinent past surgical history. History reviewed. No pertinent family history. History  Substance Use Topics  . Smoking status: Current Some Day Smoker  . Smokeless tobacco: Not on file  . Alcohol Use: No   OB History   Grav Para Term Preterm Abortions TAB SAB Ect Mult Living                 Review of Systems  Constitutional: Negative for chills.  HENT: Negative for congestion and rhinorrhea.   Gastrointestinal: Negative for diarrhea and abdominal distention.  Endocrine: Negative for polyuria.  Genitourinary: Negative for dysuria.  Musculoskeletal: Negative for neck pain and neck stiffness.  Skin: Negative for rash.  Psychiatric/Behavioral: Negative.     Allergies  Review of patient's allergies indicates no known allergies.  Home Medications   Current Outpatient Rx  Name  Route  Sig  Dispense  Refill  . Aspirin-Acetaminophen-Caffeine (GOODYS EXTRA STRENGTH PO)   Oral   Take 2-3 packets by mouth every 6 (six) hours as needed (headache).          BP 108/75  Pulse 99  Temp(Src) 98.4 F (36.9 C) (Oral)  Resp 16  SpO2 97%  LMP 09/27/2013 Physical Exam  Nursing note and vitals reviewed. Constitutional:  She is oriented to person, place, and time. She appears well-developed and well-nourished. No distress.  HENT:  Head: Normocephalic and atraumatic.  Right Ear: External ear normal.  Left Ear: External ear normal.  Nose: Nose normal.  Mouth/Throat: Oropharynx is clear and moist. No oropharyngeal exudate.  Eyes: EOM are normal. Pupils are equal, round, and reactive to light.  Neck: Normal range of motion. Neck supple. No tracheal deviation present.  Cardiovascular: Normal rate.   Pulmonary/Chest: Effort normal and breath sounds normal. No stridor. No respiratory distress. She has no wheezes. She has no rales.  Abdominal: Soft. She exhibits no distension. There is no tenderness. There is no rebound.  Musculoskeletal: Normal range of motion.  Neurological: She is alert and oriented to person, place, and time.  Skin: Skin is warm and dry. She is not diaphoretic.    ED Course  Procedures (including critical care time) Labs Review Labs Reviewed  CBC - Abnormal; Notable for the following:    RBC 5.55 (*)    MCV 67.9 (*)    MCH 22.0 (*)    RDW 16.6 (*)    All other components within normal limits  BASIC METABOLIC PANEL  POCT I-STAT TROPONIN I   Imaging Review Dg Chest 2 View  10/10/2013   CLINICAL DATA:  Pain; history of asthma  EXAM: CHEST  2 VIEW  COMPARISON:  June 27, 2013  FINDINGS: The lungs are clear.  The heart size and pulmonary vascularity are normal. No adenopathy. No pneumothorax. No bone lesions.  IMPRESSION: No abnormality noted.   Electronically Signed   By: Bretta Bang M.D.   On: 10/10/2013 14:36    EKG Interpretation   None       MDM   1. Costochondritis     Amy Sexton is a 27 y.o. female who presents to the emergency department for concern of pleuritic chest pain x 1 month.  Exam with very reproducible pain on sternal palpation.  Doubt ACS given age and lack of risk factors.  Patient is PERC negative.  CXR negative for ptx, pneumonia, widened  mediastinum or other acute abnormality.  Most c/w URI with costochondritis.  Patient safe for discharge home.  Will treat with high dose NSAIDs.  Patient discharged.    Arloa Koh, MD 10/10/13 2029

## 2013-10-10 NOTE — ED Notes (Signed)
Pt updated on wait time.  

## 2013-10-10 NOTE — ED Notes (Signed)
Pt c/o CP , headache, and feeling dizzy since yesterday. States she thought if she rested all night the symptoms would get better but they did not. A&ox4, resp e/u

## 2013-10-11 NOTE — ED Provider Notes (Addendum)
Medical screening examination/treatment/procedure(s) were conducted as a shared visit with non-physician practitioner(s) or resident  and myself.  I personally evaluated the patient during the encounter and agree with the findings and plan unless otherwise indicated.    I have personally reviewed any xrays and/ or EKG's with the provider and I agree with interpretation.   Anterior chest pain with coughing and palpation, recent URI, Patient denies blood clot history, active cancer, recent major trauma or surgery, unilateral leg swelling/ pain, recent long travel, hemoptysis or oral contraceptives.  Lungs clear, abd soft/ NT, tender anterior ribs moderate/ reproduceable on exam, RRR no murmurs, no leg pain or swellingwell appearing.  Costochondritis, URI   Enid Skeens, MD 10/11/13 0158  Pt had an EKG in triage done, clinically not cardiac however EKG showed mild t wave inversions inferior and lateral which appear new compared to previous.  Troponin neg.  Plan to call patient at home to ensure close fup outpatient and reasons to return since ekg was abnormal.    Enid Skeens, MD 10/11/13 0205  Spoke with patient, she feels fine.  Discussed abnormal EKG and to fup closely outpt for possible echo vs stress, strict reasons to return given.  Pt comfortable with plan.  Pt is low risk, only risk factor is smoking.   Enid Skeens, MD 10/11/13 [phone]

## 2013-12-24 ENCOUNTER — Encounter (HOSPITAL_COMMUNITY): Payer: Self-pay | Admitting: Emergency Medicine

## 2013-12-24 ENCOUNTER — Emergency Department (HOSPITAL_COMMUNITY)
Admission: EM | Admit: 2013-12-24 | Discharge: 2013-12-24 | Disposition: A | Discharge status: Home / Self Care | Payer: Self-pay | Attending: Emergency Medicine | Admitting: Emergency Medicine

## 2013-12-24 DIAGNOSIS — E860 Dehydration: Secondary | ICD-10-CM | POA: Insufficient documentation

## 2013-12-24 DIAGNOSIS — J020 Streptococcal pharyngitis: Secondary | ICD-10-CM | POA: Insufficient documentation

## 2013-12-24 DIAGNOSIS — Z8659 Personal history of other mental and behavioral disorders: Secondary | ICD-10-CM | POA: Insufficient documentation

## 2013-12-24 DIAGNOSIS — J45909 Unspecified asthma, uncomplicated: Secondary | ICD-10-CM | POA: Insufficient documentation

## 2013-12-24 DIAGNOSIS — F17200 Nicotine dependence, unspecified, uncomplicated: Secondary | ICD-10-CM | POA: Insufficient documentation

## 2013-12-24 DIAGNOSIS — R52 Pain, unspecified: Secondary | ICD-10-CM | POA: Insufficient documentation

## 2013-12-24 LAB — RAPID STREP SCREEN (MED CTR MEBANE ONLY): STREPTOCOCCUS, GROUP A SCREEN (DIRECT): POSITIVE — AB

## 2013-12-24 MED ORDER — LIDOCAINE VISCOUS 2 % MT SOLN: 20.0000 mL | OROMUCOSAL | Status: DC | PRN

## 2013-12-24 MED ORDER — AMOXICILLIN 500 MG PO CAPS: 500.0000 mg | ORAL_CAPSULE | Freq: Three times a day (TID) | ORAL | Status: DC

## 2013-12-24 MED ORDER — LIDOCAINE VISCOUS 2 % MT SOLN
15.0000 mL | Freq: Once | OROMUCOSAL | Status: AC
  Administered 2013-12-24: 15 mL via OROMUCOSAL
  Filled 2013-12-24: qty 15

## 2013-12-24 MED ORDER — DEXAMETHASONE SODIUM PHOSPHATE 10 MG/ML IJ SOLN
10.0000 mg | Freq: Once | INTRAMUSCULAR | Status: AC
  Administered 2013-12-24: 10 mg via INTRAMUSCULAR
  Filled 2013-12-24: qty 1

## 2013-12-24 NOTE — ED Notes (Signed)
Pt reports sore throat x 1 week. C/o cough and body aches  X 1 week

## 2013-12-24 NOTE — Discharge Instructions (Signed)
Strep Throat  Strep throat is an infection of the throat caused by a bacteria named Streptococcus pyogenes. Your caregiver may call the infection streptococcal "tonsillitis" or "pharyngitis" depending on whether there are signs of inflammation in the tonsils or back of the throat. Strep throat is most common in children aged 29 15 years during the cold months of the year, but it can occur in people of any age during any season. This infection is spread from person to person (contagious) through coughing, sneezing, or other close contact.  SYMPTOMS   · Fever or chills.  · Painful, swollen, red tonsils or throat.  · Pain or difficulty when swallowing.  · White or yellow spots on the tonsils or throat.  · Swollen, tender lymph nodes or "glands" of the neck or under the jaw.  · Red rash all over the body (rare).  DIAGNOSIS   Many different infections can cause the same symptoms. A test must be done to confirm the diagnosis so the right treatment can be given. A "rapid strep test" can help your caregiver make the diagnosis in a few minutes. If this test is not available, a light swab of the infected area can be used for a throat culture test. If a throat culture test is done, results are usually available in a day or two.  TREATMENT   Strep throat is treated with antibiotic medicine.  HOME CARE INSTRUCTIONS   · Gargle with 1 tsp of salt in 1 cup of warm water, 3 4 times per day or as needed for comfort.  · Family members who also have a sore throat or fever should be tested for strep throat and treated with antibiotics if they have the strep infection.  · Make sure everyone in your household washes their hands well.  · Do not share food, drinking cups, or personal items that could cause the infection to spread to others.  · You may need to eat a soft food diet until your sore throat gets better.  · Drink enough water and fluids to keep your urine clear or pale yellow. This will help prevent dehydration.  · Get plenty of  rest.  · Stay home from school, daycare, or work until you have been on antibiotics for 24 hours.  · Only take over-the-counter or prescription medicines for pain, discomfort, or fever as directed by your caregiver.  · If antibiotics are prescribed, take them as directed. Finish them even if you start to feel better.  SEEK MEDICAL CARE IF:   · The glands in your neck continue to enlarge.  · You develop a rash, cough, or earache.  · You cough up green, yellow-brown, or bloody sputum.  · You have pain or discomfort not controlled by medicines.  · Your problems seem to be getting worse rather than better.  SEEK IMMEDIATE MEDICAL CARE IF:   · You develop any new symptoms such as vomiting, severe headache, stiff or painful neck, chest pain, shortness of breath, or trouble swallowing.  · You develop severe throat pain, drooling, or changes in your voice.  · You develop swelling of the neck, or the skin on the neck becomes red and tender.  · You have a fever.  · You develop signs of dehydration, such as fatigue, dry mouth, and decreased urination.  · You become increasingly sleepy, or you cannot wake up completely.  Document Released: 10/10/2000 Document Revised: 09/29/2012 Document Reviewed: 12/12/2010  [employer]® Patient Information ©2014 [employer].

## 2013-12-24 NOTE — ED Provider Notes (Signed)
CSN: [id number]     Arrival date & time 12/24/13  1310 History  This chart was scribed for non-physician practitioner, Arthor Captain, PA-C,working with Audree Camel, MD, by Karle Plumber, ED Scribe.  This patient was seen in room WTR5/WTR5 and the patient's care was started at 2:17 PM.  Chief Complaint  Patient presents with  . Sore Throat    pain in throat x 1 week   The history is provided by the patient. No language interpreter was used.   HPI Comments:  Amy Sexton is a 28 y.o. female with h/o asthma who presents to the Emergency Department complaining of a sore throat that started three days ago. She reports associated cough and generalized body aches. Pt states upon waking on Thursday morning she felt as if her throat was swollen. She states she has not been able to eat anything due to the pain. She denies fever. Pt states she is a smoker.   Past Medical History  Diagnosis Date  . Asthma   . ADHD (attention deficit hyperactivity disorder)    No past surgical history on file. No family history on file. History  Substance Use Topics  . Smoking status: Current Some Day Smoker  . Smokeless tobacco: Not on file  . Alcohol Use: No   OB History   Grav Para Term Preterm Abortions TAB SAB Ect Mult Living                 Review of Systems  Constitutional: Negative for fever.  HENT: Positive for sore throat.   All other systems reviewed and are negative.   Allergies  Review of patient's allergies indicates no known allergies.  Home Medications   Current Outpatient Rx  Name  Route  Sig  Dispense  Refill  . Aspirin-Acetaminophen-Caffeine (GOODYS EXTRA STRENGTH PO)   Oral   Take 2-3 packets by mouth every 6 (six) hours as needed (headache).         Marland Kitchen ibuprofen (ADVIL,MOTRIN) 600 MG tablet   Oral   Take 1 tablet (600 mg total) by mouth every 6 (six) hours as needed.   30 tablet   0    Triage Vitals: BP 114/94  Pulse 102  Temp(Src) 98.2 F (36.8 C) (Oral)   Resp 20  Wt 210 lb (95.255 kg)  LMP 11/27/2013 Physical Exam  Nursing note and vitals reviewed. Constitutional: She is oriented to person, place, and time. She appears well-developed and well-nourished. No distress.  HENT:  Head: Normocephalic and atraumatic.  Mouth/Throat: Uvula is midline. No oropharyngeal exudate, posterior oropharyngeal edema or tonsillar abscesses.  Large, inflamed, erythematous uvula. Tonsil are not grossly swollen. Tender to palpation.  Eyes: EOM are normal.  Neck: Normal range of motion.  Cardiovascular: Normal rate.   Pulmonary/Chest: Effort normal. She has rhonchi.  Musculoskeletal: Normal range of motion.  Lymphadenopathy:    She has cervical adenopathy (bilateral).  Neurological: She is alert and oriented to person, place, and time.  Skin: Skin is warm and dry.  Psychiatric: She has a normal mood and affect. Her behavior is normal.    ED Course  Procedures (including critical care time) DIAGNOSTIC STUDIES:   COORDINATION OF CARE: 2:22 PM- Will give an injection of dexamethasone. Will prescribe antibiotic. Pt verbalizes understanding and agrees to plan.  Medications  dexamethasone (DECADRON) injection 10 mg (not administered)  lidocaine (XYLOCAINE) 2 % viscous mouth solution 15 mL (not administered)    Labs Review Labs Reviewed  RAPID STREP SCREEN - Abnormal;  Notable for the following:    Streptococcus, Group A Screen (Direct) POSITIVE (*)    All other components within normal limits   Imaging Review No results found.   EKG Interpretation None      MDM   Final diagnoses:  Strep pharyngitis    Pt afebrile with tonsillar exudate, cervical lymphadenopathy, & dysphagia; diagnosis of strep.   Pt appears mildly dehydrated, discussed importance of water rehydration. Presentation non concerning for PTA or infxn spread to soft tissue. No trismus or uvula deviation.  Amoxil and lidocaine at d.c.Specific return precautions discussed. Pt able  to drink water in ED without difficulty with intact air way. Recommended PCP follow up.    I personally performed the services described in this documentation, which was scribed in my presence. The recorded information has been reviewed and is accurate.    Arthor CaptainAbigail Ladawna Walgren, PA-C 12/28/13 1355

## 2013-12-30 NOTE — ED Provider Notes (Signed)
Medical screening examination/treatment/procedure(s) were performed by non-physician practitioner and as supervising physician I was immediately available for consultation/collaboration.   EKG Interpretation None        Casson Catena T Everli Rother, MD 12/30/13 1354 

## 2015-01-02 ENCOUNTER — Emergency Department (HOSPITAL_COMMUNITY): Payer: No Typology Code available for payment source

## 2015-01-02 ENCOUNTER — Emergency Department (HOSPITAL_COMMUNITY)
Admission: EM | Admit: 2015-01-02 | Discharge: 2015-01-02 | Disposition: A | Discharge status: Home / Self Care | Payer: No Typology Code available for payment source | Attending: Emergency Medicine | Admitting: Emergency Medicine

## 2015-01-02 ENCOUNTER — Encounter (HOSPITAL_COMMUNITY): Payer: Self-pay | Admitting: *Deleted

## 2015-01-02 DIAGNOSIS — Z8659 Personal history of other mental and behavioral disorders: Secondary | ICD-10-CM | POA: Diagnosis not present

## 2015-01-02 DIAGNOSIS — Z792 Long term (current) use of antibiotics: Secondary | ICD-10-CM | POA: Insufficient documentation

## 2015-01-02 DIAGNOSIS — R509 Fever, unspecified: Secondary | ICD-10-CM | POA: Insufficient documentation

## 2015-01-02 DIAGNOSIS — Z3202 Encounter for pregnancy test, result negative: Secondary | ICD-10-CM | POA: Diagnosis not present

## 2015-01-02 DIAGNOSIS — Z720 Tobacco use: Secondary | ICD-10-CM | POA: Diagnosis not present

## 2015-01-02 DIAGNOSIS — R079 Chest pain, unspecified: Secondary | ICD-10-CM | POA: Insufficient documentation

## 2015-01-02 DIAGNOSIS — J45901 Unspecified asthma with (acute) exacerbation: Secondary | ICD-10-CM | POA: Diagnosis not present

## 2015-01-02 DIAGNOSIS — R05 Cough: Secondary | ICD-10-CM | POA: Insufficient documentation

## 2015-01-02 LAB — BASIC METABOLIC PANEL
ANION GAP: 8 (ref 5–15)
BUN: 8 mg/dL (ref 6–23)
CHLORIDE: 108 mmol/L (ref 96–112)
CO2: 23 mmol/L (ref 19–32)
Calcium: 9.2 mg/dL (ref 8.4–10.5)
Creatinine, Ser: 0.88 mg/dL (ref 0.50–1.10)
GFR calc non Af Amer: 88 mL/min — ABNORMAL LOW (ref >90)
Glucose, Bld: 86 mg/dL (ref 70–99)
POTASSIUM: 3.8 mmol/L (ref 3.5–5.1)
SODIUM: 139 mmol/L (ref 135–145)

## 2015-01-02 LAB — I-STAT TROPONIN, ED: Troponin i, poc: 0 ng/mL (ref 0.00–0.08)

## 2015-01-02 LAB — CBC
HEMATOCRIT: 38.9 % (ref 36.0–46.0)
HEMOGLOBIN: 12.5 g/dL (ref 12.0–15.0)
MCH: 21.9 pg — ABNORMAL LOW (ref 26.0–34.0)
MCHC: 32.1 g/dL (ref 30.0–36.0)
MCV: 68 fL — ABNORMAL LOW (ref 78.0–100.0)
Platelets: 212 10*3/uL (ref 150–400)
RBC: 5.72 MIL/uL — ABNORMAL HIGH (ref 3.87–5.11)
RDW: 16.2 % — ABNORMAL HIGH (ref 11.5–15.5)
WBC: 5.8 10*3/uL (ref 4.0–10.5)

## 2015-01-02 LAB — D-DIMER, QUANTITATIVE: D-Dimer, Quant: 1.37 ug/mL-FEU — ABNORMAL HIGH (ref 0.00–0.48)

## 2015-01-02 LAB — POC URINE PREG, ED: Preg Test, Ur: NEGATIVE

## 2015-01-02 MED ORDER — BENZONATATE 100 MG PO CAPS: 100.0000 mg | ORAL_CAPSULE | Freq: Three times a day (TID) | ORAL | Status: DC

## 2015-01-02 MED ORDER — PREDNISONE 20 MG PO TABS
60.0000 mg | ORAL_TABLET | Freq: Once | ORAL | Status: AC
  Administered 2015-01-02: 60 mg via ORAL
  Filled 2015-01-02: qty 3

## 2015-01-02 MED ORDER — IBUPROFEN 800 MG PO TABS: 800.0000 mg | ORAL_TABLET | Freq: Three times a day (TID) | ORAL | Status: DC

## 2015-01-02 MED ORDER — ALBUTEROL SULFATE (2.5 MG/3ML) 0.083% IN NEBU
5.0000 mg | INHALATION_SOLUTION | Freq: Once | RESPIRATORY_TRACT | Status: AC
  Administered 2015-01-02: 5 mg via RESPIRATORY_TRACT
  Filled 2015-01-02: qty 6

## 2015-01-02 MED ORDER — ALBUTEROL SULFATE HFA 108 (90 BASE) MCG/ACT IN AERS
2.0000 | INHALATION_SPRAY | RESPIRATORY_TRACT | Status: DC | PRN
  Administered 2015-01-02: 2 via RESPIRATORY_TRACT
  Filled 2015-01-02: qty 6.7

## 2015-01-02 MED ORDER — IPRATROPIUM BROMIDE 0.02 % IN SOLN
0.5000 mg | Freq: Once | RESPIRATORY_TRACT | Status: AC
  Administered 2015-01-02: 0.5 mg via RESPIRATORY_TRACT
  Filled 2015-01-02: qty 2.5

## 2015-01-02 MED ORDER — PREDNISONE 20 MG PO TABS: 60.0000 mg | ORAL_TABLET | Freq: Every day | ORAL | Status: DC

## 2015-01-02 MED ORDER — IBUPROFEN 800 MG PO TABS
800.0000 mg | ORAL_TABLET | Freq: Once | ORAL | Status: AC
  Administered 2015-01-02: 800 mg via ORAL
  Filled 2015-01-02: qty 1

## 2015-01-02 MED ORDER — IOHEXOL 350 MG/ML SOLN
100.0000 mL | Freq: Once | INTRAVENOUS | Status: AC | PRN
  Administered 2015-01-02: 100 mL via INTRAVENOUS

## 2015-01-02 NOTE — Discharge Instructions (Signed)
Read the instructions below on reasons to return to the emergency department and to learn more about your diagnosis.  Use over the counter medications for symptomatic relief as we discussed (musinex as a decongestant, Tylenol for fever/pain, Motrin/Ibuprofen for muscle aches).  Start taking Prednisone.  Take your first dose tomorrow.  Followup with your primary care doctor in 4 days if your symptoms persist.  Your more than welcome to return to the emergency department if symptoms worsen or become concerning.  Upper Respiratory Infection, Adult  An upper respiratory infection (URI) is also sometimes known as the common cold. Most people improve within 1 week, but symptoms can last up to 2 weeks. A residual cough may last even longer.   URI is most commonly caused by a virus. Viruses are NOT treated with antibiotics. You can easily spread the virus to others by oral contact. This includes kissing, sharing a glass, coughing, or sneezing. Touching your mouth or nose and then touching a surface, which is then touched by another person, can also spread the virus.   TREATMENT  Treatment is directed at relieving symptoms. There is no cure. Antibiotics are not effective, because the infection is caused by a virus, not by bacteria. Treatment may include:  Increased fluid intake. Sports drinks offer valuable electrolytes, sugars, and fluids.  Breathing heated mist or steam (vaporizer or shower).  Eating chicken soup or other clear broths, and maintaining good nutrition.  Getting plenty of rest.  Using gargles or lozenges for comfort.  Controlling fevers with ibuprofen or acetaminophen as directed by your caregiver.  Increasing usage of your inhaler if you have asthma.  Return to work when your temperature has returned to normal.   SEEK MEDICAL CARE IF:  After the first few days, you feel you are getting worse rather than better.  You develop worsening shortness of breath, or brown or red sputum. These may  be signs of pneumonia.  You develop yellow or brown nasal discharge or pain in the face, especially when you bend forward. These may be signs of sinusitis.  You develop a fever, swollen neck glands, pain with swallowing, or white areas in the back of your throat. These may be signs of strep throat.

## 2015-01-02 NOTE — ED Notes (Signed)
Pt is here with chest pain for three days, cough, and fever

## 2015-01-02 NOTE — ED Provider Notes (Signed)
CSN: [id number]     Arrival date & time 01/02/15  1315 History   First MD Initiated Contact with Patient 01/02/15 1524     Chief Complaint  Patient presents with  . Chest Pain  . Cough  . Fever     (Consider location/radiation/quality/duration/timing/severity/associated sxs/prior Treatment) HPI Comments: Patient with a history of Asthma presents today with a chief complaint of chest pain.  Pain has been present intermittently for the past three days.  She has not noticed an association with exertion or eating.  She has also had an associated productive cough and an associated fever.  She reports that the chest pain is worse with coughing and also deep breaths.  Pain located across her chest and does not radiate.  She states that the fever was 103 F orally this morning.  However, she was not taken an antipyretic today and temp is 98.5 F orally upon arrival in the ED.  She reports that yesterday she took Mucinex and Tylenol Cold and Flu, which helped somewhat.  However, she has not taken anything for her symptoms today.  She also reports mild associated SOB.  Denies hemoptysis, syncope, nasal congestion, sore throat, headache, nausea, or vomiting.  She denies history of PE or DVT.  Denies prolonged travel or surgeries in the past 4 weeks.  Denies use of exogenous estrogen.  No LE edema or pain.  She currently smokes 7-8 cigarettes daily.  She denies prior cardiac history.    The history is provided by the patient.    Past Medical History  Diagnosis Date  . Asthma   . ADHD (attention deficit hyperactivity disorder)    History reviewed. No pertinent past surgical history. No family history on file. History  Substance Use Topics  . Smoking status: Current Some Day Smoker  . Smokeless tobacco: Not on file  . Alcohol Use: No   OB History    No data available     Review of Systems  All other systems reviewed and are negative.     Allergies  Review of patient's allergies indicates no  known allergies.  Home Medications   Prior to Admission medications   Medication Sig Start Date End Date Taking? Authorizing Provider  amoxicillin (AMOXIL) 500 MG capsule Take 1 capsule (500 mg total) by mouth 3 (three) times daily. 12/24/13   Arthor CaptainAbigail Harris, PA-C  DM-Doxylamine-Acetaminophen (TYLENOL COUGH/SORE THROAT) 15-6.25-500 MG/15ML MISC Take 15 mLs by mouth every 4 (four) hours as needed (sore throat).    Historical Provider, MD  lidocaine (XYLOCAINE) 2 % solution Use as directed 20 mLs in the mouth or throat as needed for mouth pain. 12/24/13   Arthor CaptainAbigail Harris, PA-C   BP 124/78 mmHg  Pulse 99  Temp(Src) 98.5 F (36.9 C) (Oral)  Resp 20  SpO2 100%  LMP 12/24/2014 Physical Exam  Constitutional: She appears well-developed and well-nourished. No distress.  HENT:  Head: Normocephalic and atraumatic.  Mouth/Throat: Oropharynx is clear and moist.  Neck: Normal range of motion. Neck supple.  Cardiovascular: Normal rate, regular rhythm and normal heart sounds.   Pulmonary/Chest: Effort normal. No accessory muscle usage. No tachypnea. No respiratory distress. She has decreased breath sounds. She exhibits tenderness.  Mild inspiratory and expiratory wheezing at the bases of both lungs.  Patient speaking in complete sentences without difficulty  Abdominal: Soft. Bowel sounds are normal. She exhibits no distension and no mass. There is no tenderness. There is no rebound and no guarding.  Musculoskeletal: Normal range of motion.  No LE edema or erythema  Neurological: She is alert.  Skin: Skin is warm and dry. She is not diaphoretic.  Psychiatric: She has a normal mood and affect.  Nursing note and vitals reviewed.   ED Course  Procedures (including critical care time) Labs Review Labs Reviewed  CBC - Abnormal; Notable for the following:    RBC 5.72 (*)    MCV 68.0 (*)    MCH 21.9 (*)    RDW 16.2 (*)    All other components within normal limits  BASIC METABOLIC PANEL - Abnormal;  Notable for the following:    GFR calc non Af Amer 88 (*)    All other components within normal limits  I-STAT TROPOININ, ED  POC URINE PREG, ED    Imaging Review Dg Chest 2 View  01/02/2015   CLINICAL DATA:  Chest pain for 3 days. Productive cough, shortness of Breath 2 days. Fever.  EXAM: CHEST  2 VIEW  COMPARISON:  10/10/2013  FINDINGS: The heart size and mediastinal contours are within normal limits. Both lungs are clear. The visualized skeletal structures are unremarkable.  IMPRESSION: No active cardiopulmonary disease.   Electronically Signed   By: Charlett Nose M.D.   On: 01/02/2015 14:52   Ct Angio Chest Pe W/cm &/or Wo Cm  01/02/2015   CLINICAL DATA:  Midsternal chest pain started Sunday. Increase shortness of breath.  EXAM: CT ANGIOGRAPHY CHEST WITH CONTRAST  TECHNIQUE: Multidetector CT imaging of the chest was performed using the standard protocol during bolus administration of intravenous contrast. Multiplanar CT image reconstructions and MIPs were obtained to evaluate the vascular anatomy.  CONTRAST:  OMNIPAQUE IOHEXOL 350 MG/ML SOLN  COMPARISON:  None.  FINDINGS: There is adequate opacification of the pulmonary arteries. There is no pulmonary embolus. The main pulmonary artery, right main pulmonary artery and left main pulmonary arteries are normal in size. The heart size is normal. There is no pericardial effusion.  The lungs are clear. There is no focal consolidation, pleural effusion or pneumothorax.  There is no axillary, hilar, or mediastinal adenopathy.  There is no lytic or blastic osseous lesion.  The visualized portions of the upper abdomen are unremarkable.  Review of the MIP images confirms the above findings.  IMPRESSION: 1. No evidence of pulmonary embolus.   Electronically Signed   By: Elige Ko   On: 01/02/2015 18:10     EKG Interpretation None       Date: 01/03/2015  Rate: 100  Rhythm: sinus tachycardia  QRS Axis: normal  Intervals: normal  ST/T Wave  abnormalities: nonspecific T wave changes  Conduction Disutrbances:none  Narrative Interpretation:   Old EKG Reviewed: unchanged     5:00 PM Discussed results of the d-dimer with the patient and the need for CT angio.   Patient reports that SOB has improved.  Lungs CTAB. MDM   Final diagnoses:  None   Patient presents today with chest pain, fever, and productive cough x 3 days.  Chest wall tender to palpation on exam.  Patient initially mildly tachycardic with a pulse of 100.  Therefore, d-dimer ordered, which was positive. Therefore, CT angio chest ordered, which was negative.  No ischemic changes on EKG.  Troponin negative.  Suspect Costochondritis.  Pain improved after Ibuprofen.  Feel that the patient is stable for discharge.  Return precautions given.      Santiago Glad, PA-C 01/03/15 [id number]  Derwood Kaplan, MD 01/04/15 0730

## 2015-01-02 NOTE — ED Notes (Signed)
Pt back in triage stating her chest is still hurting and now she is having pain down her left arm.

## 2015-01-02 NOTE — ED Notes (Signed)
Repeated EKG.

## 2015-03-09 ENCOUNTER — Encounter (HOSPITAL_COMMUNITY): Payer: Self-pay | Admitting: *Deleted

## 2015-03-09 ENCOUNTER — Emergency Department (HOSPITAL_COMMUNITY)
Admission: EM | Admit: 2015-03-09 | Discharge: 2015-03-09 | Disposition: A | Discharge status: Home / Self Care | Payer: No Typology Code available for payment source | Attending: Emergency Medicine | Admitting: Emergency Medicine

## 2015-03-09 DIAGNOSIS — J45909 Unspecified asthma, uncomplicated: Secondary | ICD-10-CM | POA: Diagnosis not present

## 2015-03-09 DIAGNOSIS — M25512 Pain in left shoulder: Secondary | ICD-10-CM | POA: Insufficient documentation

## 2015-03-09 DIAGNOSIS — Z791 Long term (current) use of non-steroidal anti-inflammatories (NSAID): Secondary | ICD-10-CM | POA: Insufficient documentation

## 2015-03-09 DIAGNOSIS — M79602 Pain in left arm: Secondary | ICD-10-CM | POA: Diagnosis not present

## 2015-03-09 DIAGNOSIS — M79642 Pain in left hand: Secondary | ICD-10-CM | POA: Diagnosis not present

## 2015-03-09 DIAGNOSIS — Z720 Tobacco use: Secondary | ICD-10-CM | POA: Diagnosis not present

## 2015-03-09 DIAGNOSIS — Z8659 Personal history of other mental and behavioral disorders: Secondary | ICD-10-CM | POA: Insufficient documentation

## 2015-03-09 LAB — CBG MONITORING, ED: Glucose-Capillary: 75 mg/dL (ref 65–99)

## 2015-03-09 MED ORDER — MELOXICAM 7.5 MG PO TABS
7.5000 mg | ORAL_TABLET | Freq: Every day | ORAL | Status: DC
Start: 2015-03-09 — End: 2015-05-17

## 2015-03-09 NOTE — Discharge Instructions (Signed)
Read the information below.  Use the prescribed medication as directed.  Please discuss all new medications with your pharmacist.  You may return to the Emergency Department at any time for worsening condition or any new symptoms that concern you.   If you develop uncontrolled pain, weakness or numbness of the extremity, severe discoloration of the skin, or you are unable to move your arm, return to the ER for a recheck.      Pain of Unknown Etiology (Pain Without a Known Cause) You have come to your caregiver because of pain. Pain can occur in any part of the body. Often there is not a definite cause. If your laboratory (blood or urine) work was normal and X-rays or other studies were normal, your caregiver may treat you without knowing the cause of the pain. An example of this is the headache. Most headaches are diagnosed by taking a history. This means your caregiver asks you questions about your headaches. Your caregiver determines a treatment based on your answers. Usually testing done for headaches is normal. Often testing is not done unless there is no response to medications. Regardless of where your pain is located today, you can be given medications to make you comfortable. If no physical cause of pain can be found, most cases of pain will gradually leave as suddenly as they came.  If you have a painful condition and no reason can be found for the pain, it is important that you follow up with your caregiver. If the pain becomes worse or does not go away, it may be necessary to repeat tests and look further for a possible cause.  Only take over-the-counter or prescription medicines for pain, discomfort, or fever as directed by your caregiver.  For the protection of your privacy, test results cannot be given over the phone. Make sure you receive the results of your test. Ask how these results are to be obtained if you have not been informed. It is your responsibility to obtain your test  results.  You may continue all activities unless the activities cause more pain. When the pain lessens, it is important to gradually resume normal activities. Resume activities by beginning slowly and gradually increasing the intensity and duration of the activities or exercise. During periods of severe pain, bed rest may be helpful. Lie or sit in any position that is comfortable.  Ice used for acute (sudden) conditions may be effective. Use a large plastic bag filled with ice and wrapped in a towel. This may provide pain relief.  See your caregiver for continued problems. Your caregiver can help or refer you for exercises or physical therapy if necessary. If you were given medications for your condition, do not drive, operate machinery or power tools, or sign legal documents for 24 hours. Do not drink alcohol, take sleeping pills, or take other medications that may interfere with treatment. See your caregiver immediately if you have pain that is becoming worse and not relieved by medications. Document Released: 07/08/2001 Document Revised: 08/03/2013 Document Reviewed: 10/13/2005 [facility] Patient Information 2015 [place], [place]. This information is not intended to replace advice given to you by your health care provider. Make sure you discuss any questions you have with your health care provider.  Musculoskeletal Pain Musculoskeletal pain is muscle and boney aches and pains. These pains can occur in any part of the body. Your caregiver may treat you without knowing the cause of the pain. They may treat you if blood or urine tests, X-rays,  and other tests were normal.  CAUSES There is often not a definite cause or reason for these pains. These pains may be caused by a type of germ (virus). The discomfort may also come from overuse. Overuse includes working out too hard when your body is not fit. Boney aches also come from weather changes. Bone is sensitive to atmospheric pressure changes. HOME CARE  INSTRUCTIONS   Ask when your test results will be ready. Make sure you get your test results.  Only take over-the-counter or prescription medicines for pain, discomfort, or fever as directed by your caregiver. If you were given medications for your condition, do not drive, operate machinery or power tools, or sign legal documents for 24 hours. Do not drink alcohol. Do not take sleeping pills or other medications that may interfere with treatment.  Continue all activities unless the activities cause more pain. When the pain lessens, slowly resume normal activities. Gradually increase the intensity and duration of the activities or exercise.  During periods of severe pain, bed rest may be helpful. Lay or sit in any position that is comfortable.  Putting ice on the injured area.  Put ice in a bag.  Place a towel between your skin and the bag.  Leave the ice on for 15 to 20 minutes, 3 to 4 times a day.  Follow up with your caregiver for continued problems and no reason can be found for the pain. If the pain becomes worse or does not go away, it may be necessary to repeat tests or do additional testing. Your caregiver may need to look further for a possible cause. SEEK IMMEDIATE MEDICAL CARE IF:  You have pain that is getting worse and is not relieved by medications.  You develop chest pain that is associated with shortness or breath, sweating, feeling sick to your stomach (nauseous), or throw up (vomit).  Your pain becomes localized to the abdomen.  You develop any new symptoms that seem different or that concern you. MAKE SURE YOU:   Understand these instructions.  Will watch your condition.  Will get help right away if you are not doing well or get worse. Document Released: 10/13/2005 Document Revised: 01/05/2012 Document Reviewed: 06/17/2013 [facility] Patient Information 2015 [place], [place]. This information is not intended to replace advice given to you by your health care  provider. Make sure you discuss any questions you have with your health care provider.   Emergency Department Resource Guide 1) Find a Doctor and Pay Out of Pocket Although you won't have to find out who is covered by your insurance plan, it is a good idea to ask around and get recommendations. You will then need to call the office and see if the doctor you have chosen will accept you as a new patient and what types of options they offer for patients who are self-pay. Some doctors offer discounts or will set up payment plans for their patients who do not have insurance, but you will need to ask so you aren't surprised when you get to your appointment.  2) Contact Your Local Health Department Not all health departments have doctors that can see patients for sick visits, but many do, so it is worth a call to see if yours does. If you don't know where your local health department is, you can check in your phone book. The CDC also has a tool to help you locate your state's health department, and many state websites also have listings of all of their  local health departments.  3) Find a Walk-in Clinic If your illness is not likely to be very severe or complicated, you may want to try a walk in clinic. These are popping up all over the country in pharmacies, drugstores, and shopping centers. They're usually staffed by nurse practitioners or physician assistants that have been trained to treat common illnesses and complaints. They're usually fairly quick and inexpensive. However, if you have serious medical issues or chronic medical problems, these are probably not your best option.  No Primary Care Doctor: - Call Health Connect at  [phone] - they can help you locate a primary care doctor that  accepts your insurance, provides certain services, etc. - Physician Referral Service- [phone]  Chronic Pain Problems: Organization         Address  Phone   Notes  [facility]  [phone] Patients need to be referred by their primary care doctor.   Medication Assistance: Organization         Address  Phone   Notes  [facility] Medication [facility] [address] [place] [place], [place] [address] [phone] --Must be a resident of [facility] -- Must have NO insurance coverage whatsoever (no Medicaid/ Medicare, etc.) -- The pt. MUST have a primary care doctor that directs their care regularly and follows them in the community   [facility]ist  [phone]   [employer]  [phone]    Agencies that provide inexpensive medical care: Organization         Address  Phone   Notes  [facility]  [phone]   [facility]    [phone]   [facility] [address] [place], [place] [address] [phone]   [facility] [address], [place] [phone]   Planned Parenthood    [phone]   [facility]    [phone]   [facility]  [address], [place] Phone:  [phone], Fax:  [phone] Hours of Operation:  9 am - 6 pm, M-F.  Also accepts Medicaid/Medicare and self-pay.  [facility]  [address], [place] Phone: [phone], Fax: [phone]. Hours of Operation:  8:30 am - 5:30 pm, M-F.  Also accepts Medicaid and self-pay.  [facility] [place] [address] [place] Phone: [phone]   [facility] [address] Natasha Bence [place], [place] [phone], Ext. [phone] Mondays & Thursdays: 7-9 AM.  First 15 patients are seen on a first come, first serve basis.    Medicaid-accepting [facility] Providers:  Organization         Address  Phone   Notes  [facility] [address], [place] [phone] Also accepts self-pay patients.  [facility] [address] Laurell Josephs [place], [place]   [phone]   [facility] [address], [place] [phone]   [facility] [address], [place] [phone]   Renaye Rakers [address], [place]   [phone] Only accepts [place] patients after they have their name applied to their card.   Self-Pay (no insurance) in [facility]:  Organization         Address  Phone   Notes  Sickle Cell Patients,  [facility] [address], [place] [phone]   [facility] [address], [place] [phone]   [facility]  [address], [place] [phone]   [facility]/Dr. Osei-Bonsu  [address] [place] [address], [place] or [phone], [place] [phone] Phone number for both [place] and [place] locations is the same.  [facility] [address], [place] [phone]) [phone]   [facility] [address] [place] [address], [place] or [address] [phone]) [phone]   [facility] [address], [place] [phone]) [phone], phone; [phone], fax Sees patients 1st and 3rd Saturday of every month.  Must not qualify for public or private insurance (i.e. Medicaid, Medicare, [place] Health Choice, Veterans' Benefits)  Household income should be no more than 200% of the poverty level The clinic cannot treat you if you are pregnant or think you are pregnant  Sexually transmitted diseases are not treated at the clinic.    Dental Care: Organization         Address  Phone  Notes  [facility] [address], [place] [phone]) [phone] Accepts children up to age 30 who are enrolled in [place]icaid or [place] Health Choice; pregnant women with a Medicaid card; and children who have applied for Medicaid or [place] Health Choice, but  were declined, whose parents can pay a reduced fee at time of service.  [facility] [place]  [address], [place] [phone]) [phone] Accepts children up to age 30 who are enrolled in [place] or [place] Health Choice; pregnant women with a Medicaid card; and children who have applied for Medicaid or [place] Health Choice, but were declined, whose parents can pay a reduced fee at time of service.  [facility]  [address], [place] [phone]) [phone] Patients are seen by appointment only. Walk-ins are not accepted. [facility] will see patients 30 years of age and older. Monday - Tuesday (8am-5pm) Most Wednesdays (8:30-5pm) $30 per visit, cash only  [facility]  [address], [place] [phone]) [phone] Patients are seen by appointment only. Walk-ins are not accepted. [facility] will see patients 30 years of age and older. One Wednesday Evening (Monthly: Volunteer Based).  $30 per visit, cash only  [employer] [facility]  [phone] for adults; Children under age 384, call Graduate Pediatric Dentistry at [phone]. Children aged 434-14, please call [phone] to request a pediatric application.  Dental services are provided in all areas of dental care including fillings, crowns and bridges, complete and partial dentures, implants, gum treatment, root canals, and extractions. Preventive care is also provided. Treatment is provided to both adults and children. Patients are selected via a lottery and there is often a waiting list.   [facility] [address], [place]  [phone]) [phone] [email or web address]   [facility] [address], [place], [place] [phone], Ext. [phone] Second and Fourth Thursday of each month, opens at 6:30 AM; Clinic ends at 9 AM.  Patients are seen on a first-come first-served basis, and a limited number are seen during each clinic.    [facility]  [address], [place], [place] [phone]) [phone]   Eligibility Requirements You must  have lived in [place], [place], or [place] counties for at least the last three months.   You cannot be eligible for state or federal sponsored National Cityhealthcare insurance, including CIGNAVeterans Administration, [place]icaid, or [employer]Medicare.   You generally cannot be eligible for healthcare insurance through your employer.    How to apply: Eligibility screenings are held every Tuesday and Wednesday afternoon from 1:00 pm until 4:00 pm. You do not need an appointment for the interview!  [facility] [address], [place], [place] [phone]   [facility]  [phone]   [facility]  [phone]   [facility]  [phone]    Behavioral Health Resources in the Community: Intensive Outpatient Programs Organization         Address  Phone  Notes  [facility] [address], [place], [place] [phone]   [facility] Outpatient [address], [place], [place] [phone]   [facility] [address], [place], [place]  [phone]   [facility] [address],  [place], [place] [phone] or [phone]   Substance Abuse Resources Organization         Address  Phone  Notes  [facility]  [phone]   [facility]  [phone]   [facility]  [phone]   [facility]  [phone]   Residential & Outpatient Substance Abuse Program  [phone]   Psychological Services Organization         Address  Phone  Notes  [facility]  [phone]- [phone]   [facility]  [phone]- [phone]   [facility] [address], [place] [phone] or [phone]    Mobile Crisis Teams Organization         Address  Phone  Notes  [facility], Mobile Crisis Care  Unit  [phone]   [facility]  [address] [place], [place] [phone]   Doristine LocksSharon DeEsch [address] [place] [phone]    Self-Help/Support Groups Organization         Address  Phone             Notes  [facility] [place] - variety of support groups  336- [phone] Call for more information  Narcotics Anonymous (NA), [facility] [address], [place]  2 meetings at this location   Statisticianesidential Treatment Programs Organization         Address  Phone  Notes  [facility] [address],    [place]  [phone]   [facility]  [address], [place] [phone], [place], [place] [phone]   [facility] [address], [place] [phone] Admissions: 8am-3pm M-F  [facility] [address]    [place], [place] [phone]   [facility] [address], [place], [place] [phone]   [facility] [address]  [place], [place] [phone]   [facility] - Intensive Outpatient [address], [place], [place] [phone]   [facility] ([facility] [address]  [place], [place] [phone] or [phone]   [facility] ([facility]) [address] [place], [place] [phone] Accepts Medicaid  [facility] [address]  [place] [phone] Substance Abuse/Addiction Treatment   [facility]al Health Resources Organization         Address  Phone  Notes  [facility]  [phone]   Angie Fava, PhD [address] Ervin Knack [place], [place]   [phone] or [phone]   [facility] Behavioral   [address] [place], [place] [phone]   [facility] [address], [place], [place] [phone] Insurance/Medicaid/sponsorship through [facility] [address]                                     [place], [place] [phone] Therapy/tele-psych/case  [facility] [address] [place], [place] [phone]    Dr. Lolly Mustache  [phone]   [facility]  [employer] [facility] Dept. 1) [address], [place] 2) [address] [place] 3)  [address], [place] [phone]  [phone]   [facility] Child Abuse Hotline 262-707-0661 or [phone] (After Hours)

## 2015-03-09 NOTE — ED Notes (Signed)
Pt reports pain started in left hand and had swelling, radiates up into her left forearm and  Now upper arm since wed. No acute distress noted. No swelling noted.

## 2015-03-09 NOTE — ED Provider Notes (Signed)
CSN: [id number]     Arrival date & time 03/09/15  1814 History  This chart was scribed for Trixie DredgeEmily Luan Maberry, PA-C, working with Cathren LaineKevin Steinl, MD by Chestine SporeSoijett Blue, ED Scribe. The patient was seen in room TR07C/TR07C at 6:56 PM.    Chief Complaint  Patient presents with  . Hand Pain  . Arm Pain      The history is provided by the patient. No language interpreter was used.    HPI Comments: Amy Sexton is a 29 y.o. female who presents to the Emergency Department complaining of left hand pain and left arm pain onset 1 week ago. Pt woke up last saturday with her left hand swollen and mild pain. Pt notes that she works in [employer] for [employer]. Pt notes that her wrist will intermittently swell and be painful has been doing so for 6 days. When the pt tries to lift anything with that wrist there is throbbing and the pain shoots up and radiates to her left shoulder. Pt is having trouble grabbing things because of the pain when she grips then. Pt is right hand dominant. Pt notes that the pain is radiated to the left shoulder. Pt denies recent injury or fall to her affected wrist. Started in the hand and the pain is now primarily in her shoulder. There is pain with movement of her left shoulder.  The pain in her left has is the worst in her thumb and middle finger but exists throughout.  Currently the pain in her left shoulder is worse the rest of her arm.    She states that she has tried Ibuprofen, Icy Hot, and wrapping with mild relief for her symptoms. She denies fever, tingling, numbness, weakness, neck pain, CP, SOB, rash, vomiting, diarrhea, bowel/bladder incontinence, and any other symptoms. Pt denies being sick recently. Denies long trips recently. Denies recent immobilization, exogenous estrogen, hx blood clots. Pt grandfather has had blood clots before. Denies any recent overuse or repetitive stress using the left arm  Grandfather was DM.      Past Medical History  Diagnosis Date  . Asthma    . ADHD (attention deficit hyperactivity disorder)    History reviewed. No pertinent past surgical history. History reviewed. No pertinent family history. History  Substance Use Topics  . Smoking status: Current Some Day Smoker  . Smokeless tobacco: Not on file  . Alcohol Use: No   OB History    No data available     Review of Systems  Musculoskeletal: Positive for myalgias, joint swelling and arthralgias.  Skin: Negative for color change and wound.  All other systems reviewed and are negative.     Allergies  Review of patient's allergies indicates no known allergies.  Home Medications   Prior to Admission medications   Medication Sig Start Date End Date Taking? Authorizing Provider  ibuprofen (ADVIL,MOTRIN) 800 MG tablet Take 1 tablet (800 mg total) by mouth 3 (three) times daily. 01/02/15  Yes Heather Laisure, PA-C  amoxicillin (AMOXIL) 500 MG capsule Take 1 capsule (500 mg total) by mouth 3 (three) times daily. Patient not taking: Reported on 01/02/2015 12/24/13   Arthor CaptainAbigail Harris, PA-C  benzonatate (TESSALON) 100 MG capsule Take 1 capsule (100 mg total) by mouth every 8 (eight) hours. Patient not taking: Reported on 03/09/2015 01/02/15   Santiago GladHeather Laisure, PA-C  lidocaine (XYLOCAINE) 2 % solution Use as directed 20 mLs in the mouth or throat as needed for mouth pain. Patient not taking: Reported on 01/02/2015 12/24/13  Abigail Harris, PA-C  predniSONE (DELTASONE) 20 MG tabArthor Captainlet Take 3 tablets (60 mg total) by mouth daily. Patient not taking: Reported on 03/09/2015 01/02/15   Santiago GladHeather Laisure, PA-C   BP 114/66 mmHg  Pulse 92  Temp(Src) 98.2 F (36.8 C) (Oral)  Resp 18  Ht 5\' 2"  (1.575 m)  Wt 212 lb (96.163 kg)  BMI 38.77 kg/m2  SpO2 100%  LMP 02/14/2015  Physical Exam  Constitutional: She appears well-developed and well-nourished. No distress.  HENT:  Head: Normocephalic and atraumatic.  Neck: Neck supple.  Cardiovascular: Normal rate, regular rhythm and normal heart sounds.   Exam reveals no gallop and no friction rub.   No murmur heard. Pulmonary/Chest: Effort normal and breath sounds normal. No respiratory distress. She has no wheezes. She has no rales. She exhibits no tenderness.  Musculoskeletal:       Left shoulder: She exhibits tenderness. She exhibits no bony tenderness.       Left elbow: Normal.       Left hand: She exhibits tenderness. She exhibits no bony tenderness.  Diffuse tenderness of the left hand, left forearm, and left shoulder. Diffuse anterior and posterior tenderness of the left shoulder. No tenderness of the left elbow or left upper arm. No focal bony tender throughout exam. No erythema edema or warmth associated with any joints. Small slight area of erythema over the left ventral forearm.  Mild edema of the left hand.  Finkelstein test negative.  Tinel test negative.    Right arm exam unremarkable.  Spine nontender, no crepitus, or stepoffs.       Neurological: She is alert. She exhibits normal muscle tone.  Skin: She is not diaphoretic.  Psychiatric: She has a normal mood and affect. Her behavior is normal.  Nursing note and vitals reviewed.   ED Course  Procedures (including critical care time) DIAGNOSTIC STUDIES: Oxygen Saturation is 100% on RA, nl by my interpretation.    COORDINATION OF CARE: 7:06 PM-Discussed treatment plan which includes US venous duplex with pt at bedside and pt agreed to plan.   Labs Review Labs Reviewed  CBG MONITORING, ED    Imaging Review No results found.   EKG Interpretation None      MDM   Final diagnoses:  Left arm pain    Afebrile, nontoxic patient with left arm pain and mild edema without injury.  No weakness or numbness.  No neck pain.  Unclear etiology for patient's symptoms.  Venous duplex ordered to r/o clot unfortunately after vascular lab closed.  Pt is very low risk for blood clot and I have therefore elected to order the test for the morning without treating her tonight  with lovenox.  PERC negative.  D/C home with mobic, PCP, orthopedic follow up.   Discussed result, findings, treatment, and follow up  with patient.  Pt given return precautions.  Pt verbalizes understanding and agrees with plan.        I personally performed the services described in this documentation, which was scribed in my presence. The recorded information has been reviewed and is accurate.    Trixie Dredgemily Doreena Maulden, PA-C 03/09/15 2202  Cathren LaineKevin Steinl, MD 03/09/15 2350

## 2015-03-10 ENCOUNTER — Ambulatory Visit (HOSPITAL_COMMUNITY)
Admission: RE | Admit: 2015-03-10 | Discharge: 2015-03-10 | Disposition: A | Discharge status: Home / Self Care | Payer: No Typology Code available for payment source | Source: Ambulatory Visit | Attending: Emergency Medicine | Admitting: Emergency Medicine

## 2015-03-10 DIAGNOSIS — M79609 Pain in unspecified limb: Secondary | ICD-10-CM

## 2015-03-10 DIAGNOSIS — M79602 Pain in left arm: Secondary | ICD-10-CM | POA: Insufficient documentation

## 2015-03-10 DIAGNOSIS — M7989 Other specified soft tissue disorders: Secondary | ICD-10-CM

## 2015-03-10 NOTE — Progress Notes (Addendum)
VASCULAR LAB PRELIMINARY  PRELIMINARY  PRELIMINARY  PRELIMINARY  Left upper extremity venous Doppler completed.    Preliminary report:  There is no DVT or SVT noted in the left upper extremity.   Tirsa Gail, RVT 03/10/2015, 9:25 AM

## 2015-05-17 ENCOUNTER — Ambulatory Visit (INDEPENDENT_AMBULATORY_CARE_PROVIDER_SITE_OTHER): Payer: Worker's Compensation | Admitting: Family Medicine

## 2015-05-17 ENCOUNTER — Ambulatory Visit: Payer: Worker's Compensation

## 2015-05-17 VITALS — BP 122/72 | HR 85 | Temp 98.2°F | Resp 17 | Ht 63.0 in | Wt 207.0 lb

## 2015-05-17 DIAGNOSIS — M79675 Pain in left toe(s): Secondary | ICD-10-CM | POA: Diagnosis not present

## 2015-05-17 NOTE — Patient Instructions (Signed)
Use the pain medicine you have at home as needed. If you need something stronger or different, please call me   It helps to keep the leg elevated so that it doesn't swell.   The x-ray does not show any fracture.
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
## 2015-08-17 ENCOUNTER — Emergency Department (HOSPITAL_COMMUNITY)
Admission: EM | Admit: 2015-08-17 | Discharge: 2015-08-17 | Disposition: A | Discharge status: Home / Self Care | Payer: No Typology Code available for payment source | Attending: Emergency Medicine | Admitting: Emergency Medicine

## 2015-08-17 ENCOUNTER — Emergency Department (HOSPITAL_COMMUNITY): Payer: No Typology Code available for payment source

## 2015-08-17 ENCOUNTER — Encounter (HOSPITAL_COMMUNITY): Payer: Self-pay | Admitting: Emergency Medicine

## 2015-08-17 DIAGNOSIS — R0789 Other chest pain: Secondary | ICD-10-CM | POA: Diagnosis not present

## 2015-08-17 DIAGNOSIS — R6883 Chills (without fever): Secondary | ICD-10-CM | POA: Diagnosis not present

## 2015-08-17 DIAGNOSIS — Z720 Tobacco use: Secondary | ICD-10-CM | POA: Diagnosis not present

## 2015-08-17 DIAGNOSIS — R079 Chest pain, unspecified: Secondary | ICD-10-CM | POA: Diagnosis present

## 2015-08-17 DIAGNOSIS — R110 Nausea: Secondary | ICD-10-CM | POA: Insufficient documentation

## 2015-08-17 DIAGNOSIS — R61 Generalized hyperhidrosis: Secondary | ICD-10-CM | POA: Diagnosis not present

## 2015-08-17 DIAGNOSIS — R200 Anesthesia of skin: Secondary | ICD-10-CM | POA: Diagnosis not present

## 2015-08-17 DIAGNOSIS — J45909 Unspecified asthma, uncomplicated: Secondary | ICD-10-CM | POA: Insufficient documentation

## 2015-08-17 LAB — BASIC METABOLIC PANEL
Anion gap: 8 (ref 5–15)
BUN: 9 mg/dL (ref 6–20)
CO2: 25 mmol/L (ref 22–32)
Calcium: 8.8 mg/dL — ABNORMAL LOW (ref 8.9–10.3)
Chloride: 107 mmol/L (ref 101–111)
Creatinine, Ser: 0.79 mg/dL (ref 0.44–1.00)
GFR calc Af Amer: >60 mL/min (ref >60)
GLUCOSE: 78 mg/dL (ref 65–99)
Potassium: 3.7 mmol/L (ref 3.5–5.1)
SODIUM: 140 mmol/L (ref 135–145)

## 2015-08-17 LAB — CBC
HCT: 34.9 % — ABNORMAL LOW (ref 36.0–46.0)
Hemoglobin: 11.2 g/dL — ABNORMAL LOW (ref 12.0–15.0)
MCH: 22.3 pg — ABNORMAL LOW (ref 26.0–34.0)
MCHC: 32.1 g/dL (ref 30.0–36.0)
MCV: 69.5 fL — ABNORMAL LOW (ref 78.0–100.0)
Platelets: 192 10*3/uL (ref 150–400)
RBC: 5.02 MIL/uL (ref 3.87–5.11)
RDW: 16.5 % — AB (ref 11.5–15.5)
WBC: 6.7 10*3/uL (ref 4.0–10.5)

## 2015-08-17 LAB — TROPONIN I

## 2015-08-17 NOTE — Discharge Instructions (Signed)
Chest Wall Pain Chest wall pain is pain in or around the bones and muscles of your chest. Sometimes, an injury causes this pain. Sometimes, the cause may not be known. This pain may take several weeks or longer to get better. HOME CARE INSTRUCTIONS  Pay attention to any changes in your symptoms. Take these actions to help with your pain:   Rest as told by your health care provider.   Avoid activities that cause pain. These include any activities that use your chest muscles or your abdominal and side muscles to lift heavy items.   If directed, apply ice to the painful area:  Put ice in a plastic bag.  Place a towel between your skin and the bag.  Leave the ice on for 20 minutes, 2-3 times per day.  Take over-the-counter and prescription medicines only as told by your health care provider.  Do not use tobacco products, including cigarettes, chewing tobacco, and e-cigarettes. If you need help quitting, ask your health care provider.  Keep all follow-up visits as told by your health care provider. This is important. SEEK MEDICAL CARE IF:  You have a fever.  Your chest pain becomes worse.  You have new symptoms. SEEK IMMEDIATE MEDICAL CARE IF:  You have nausea or vomiting.  You feel sweaty or light-headed.  You have a cough with phlegm (sputum) or you cough up blood.  You develop shortness of breath.   This information is not intended to replace advice given to you by your health care provider. Make sure you discuss any questions you have with your health care provider.   Document Released: 10/13/2005 Document Revised: 07/04/2015 Document Reviewed: 01/08/2015 Elsevier Interactive Patient Education 2016 Elsevier Inc. Viral Infections A virus is a type of germ. Viruses can cause:  Minor sore throats.  Aches and pains.  Headaches.  Runny nose.  Rashes.  Watery eyes.  Tiredness.  Coughs.  Loss of appetite.  Feeling sick to your stomach (nausea).  Throwing  up (vomiting).  Watery poop (diarrhea). HOME CARE   Only take medicines as told by your doctor.  Drink enough water and fluids to keep your pee (urine) clear or pale yellow. Sports drinks are a good choice.  Get plenty of rest and eat healthy. Soups and broths with crackers or rice are fine. GET HELP RIGHT AWAY IF:   You have a very bad headache.  You have shortness of breath.  You have chest pain or neck pain.  You have an unusual rash.  You cannot stop throwing up.  You have watery poop that does not stop.  You cannot keep fluids down.  You or your child has a temperature by mouth above 102 F (38.9 C), not controlled by medicine.  Your baby is older than 3 months with a rectal temperature of 102 F (38.9 C) or higher.  Your baby is 383 months old or younger with a rectal temperature of 100.4 F (38 C) or higher. MAKE SURE YOU:   Understand these instructions.  Will watch this condition.  Will get help right away if you are not doing well or get worse.   This information is not intended to replace advice given to you by your health care provider. Make sure you discuss any questions you have with your health care provider.   Document Released: 09/25/2008 Document Revised: 01/05/2012 Document Reviewed: 03/21/2015 Elsevier Interactive Patient Education [employer]2016 Elsevier Inc.

## 2015-08-17 NOTE — ED Notes (Signed)
AVS explained in detail. [facility] and if needed, cardiology follow up. No SOB noted. A&Ox4. Ambulatory with steady gait. No other c/c.

## 2015-08-17 NOTE — ED Notes (Signed)
Pt c/o central cp, chills, and lt arm numbness. Pt states that she has been having chills x 1 wk, lt arm numbness x 2 days, and central cp x 1 wk.  Denies NV.  Complaints of diarrhea.

## 2015-08-17 NOTE — ED Provider Notes (Signed)
CSN: [id number]     Arrival date & time 08/17/15  1256 History   First MD Initiated Contact with Patient 08/17/15 1312     Chief Complaint  Patient presents with  . Chills  . Chest Pain  . Numbness     (Consider location/radiation/quality/duration/timing/severity/associated sxs/prior Treatment) Patient is a 29 y.o. female presenting with chest pain.  Chest Pain Pain location:  Substernal area Pain quality comment:  Tingly, sharp Pain radiates to:  L arm Onset quality:  Gradual Duration:  1 week Timing:  Intermittent Progression:  Waxing and waning Chronicity:  New Context comment:  Ho similar, unremarkable wu Relieved by:  Nothing Worsened by:  Nothing tried Associated symptoms: diaphoresis and nausea   Associated symptoms: no cough and not vomiting     Past Medical History  Diagnosis Date  . Asthma   . ADHD (attention deficit hyperactivity disorder)    No past surgical history on file. No family history on file. Social History  Substance Use Topics  . Smoking status: Current Some Day Smoker -- 1.00 packs/day for 9 years    Types: Cigarettes  . Smokeless tobacco: None  . Alcohol Use: No   OB History    No data available     Review of Systems  Constitutional: Positive for diaphoresis.  Respiratory: Negative for cough.   Cardiovascular: Positive for chest pain.  Gastrointestinal: Positive for nausea. Negative for vomiting.  All other systems reviewed and are negative.     Allergies  Review of patient's allergies indicates no known allergies.  Home Medications   Prior to Admission medications   Medication Sig Start Date End Date Taking? Authorizing Provider  ibuprofen (ADVIL,MOTRIN) 200 MG tablet Take 400-600 mg by mouth every 6 (six) hours as needed for headache, mild pain or moderate pain.   Yes Historical Provider, MD   BP 115/72 mmHg  Pulse 78  Temp(Src) 98.6 F (37 C) (Oral)  Resp 24  SpO2 97%  LMP 08/10/2015 (Approximate) Physical Exam   Constitutional: She is oriented to person, place, and time. She appears well-developed and well-nourished.  HENT:  Head: Normocephalic and atraumatic.  Right Ear: External ear normal.  Left Ear: External ear normal.  Eyes: Conjunctivae and EOM are normal. Pupils are equal, round, and reactive to light.  Neck: Normal range of motion. Neck supple.  Cardiovascular: Normal rate, regular rhythm, normal heart sounds and intact distal pulses.   Pulmonary/Chest: Effort normal and breath sounds normal.  Abdominal: Soft. Bowel sounds are normal. There is no tenderness.  Musculoskeletal: Normal range of motion.  Neurological: She is alert and oriented to person, place, and time. She has normal strength and normal reflexes. No cranial nerve deficit or sensory deficit.  Skin: Skin is warm and dry.  Vitals reviewed.   ED Course  Procedures (including critical care time) Labs Review Labs Reviewed  BASIC METABOLIC PANEL - Abnormal; Notable for the following:    Calcium 8.8 (*)    All other components within normal limits  CBC - Abnormal; Notable for the following:    Hemoglobin 11.2 (*)    HCT 34.9 (*)    MCV 69.5 (*)    MCH 22.3 (*)    RDW 16.5 (*)    All other components within normal limits  TROPONIN I    Imaging Review No results found. I have personally reviewed and evaluated these images and lab results as part of my medical decision-making.   EKG Interpretation   Date/Time:  Friday August 17 2015 13:09:31 EDT Ventricular Rate:  81 PR Interval:  160 QRS Duration: 82 QT Interval:  365 QTC Calculation: 424 R Axis:   31 Text Interpretation:  Sinus rhythm Low voltage, precordial leads No  significant change since last tracing Confirmed by Mirian MoGentry, Matthew [phone]([id number])  on 08/17/2015 1:25:28 PM      MDM   Final diagnoses:  Chest wall pain    29 y.o. female with pertinent PMH of Asthma presents with chest pain as above.  On arrival vitals and physical exam as above.  No new  symptoms in 6 hours.  Pt well appearing.  History and wu not consistent with acs.  Not dyspneic or demonstrating other signs of PE.  DC home in stable condition    I have reviewed all laboratory and imaging studies if ordered as above  1. Chest wall pain         Mirian MoMatthew Gentry, MD 08/23/15 [phone]

## 2015-11-21 ENCOUNTER — Encounter (HOSPITAL_COMMUNITY): Payer: Self-pay | Admitting: Emergency Medicine

## 2015-11-21 ENCOUNTER — Emergency Department (HOSPITAL_COMMUNITY)
Admission: EM | Admit: 2015-11-21 | Discharge: 2015-11-21 | Disposition: A | Discharge status: Home / Self Care | Payer: No Typology Code available for payment source | Attending: Emergency Medicine | Admitting: Emergency Medicine

## 2015-11-21 DIAGNOSIS — M62830 Muscle spasm of back: Secondary | ICD-10-CM | POA: Insufficient documentation

## 2015-11-21 DIAGNOSIS — J45909 Unspecified asthma, uncomplicated: Secondary | ICD-10-CM | POA: Insufficient documentation

## 2015-11-21 DIAGNOSIS — Z8659 Personal history of other mental and behavioral disorders: Secondary | ICD-10-CM | POA: Insufficient documentation

## 2015-11-21 DIAGNOSIS — F17210 Nicotine dependence, cigarettes, uncomplicated: Secondary | ICD-10-CM | POA: Insufficient documentation

## 2015-11-21 DIAGNOSIS — N390 Urinary tract infection, site not specified: Secondary | ICD-10-CM | POA: Insufficient documentation

## 2015-11-21 LAB — URINE MICROSCOPIC-ADD ON

## 2015-11-21 LAB — URINALYSIS, ROUTINE W REFLEX MICROSCOPIC
Bilirubin Urine: NEGATIVE
GLUCOSE, UA: NEGATIVE mg/dL
HGB URINE DIPSTICK: NEGATIVE
Ketones, ur: NEGATIVE mg/dL
Nitrite: NEGATIVE
PH: 7 (ref 5.0–8.0)
Protein, ur: NEGATIVE mg/dL
SPECIFIC GRAVITY, URINE: 1.021 (ref 1.005–1.030)

## 2015-11-21 LAB — I-STAT CHEM 8, ED
BUN: 8 mg/dL (ref 6–20)
Calcium, Ion: 1.16 mmol/L (ref 1.12–1.23)
Chloride: 104 mmol/L (ref 101–111)
Creatinine, Ser: 0.8 mg/dL (ref 0.44–1.00)
Glucose, Bld: 74 mg/dL (ref 65–99)
HEMATOCRIT: 44 % (ref 36.0–46.0)
HEMOGLOBIN: 15 g/dL (ref 12.0–15.0)
Potassium: 4.1 mmol/L (ref 3.5–5.1)
Sodium: 139 mmol/L (ref 135–145)
TCO2: 24 mmol/L (ref 0–100)

## 2015-11-21 MED ORDER — CYCLOBENZAPRINE HCL 10 MG PO TABS: 10.0000 mg | ORAL_TABLET | Freq: Two times a day (BID) | ORAL | Status: DC | PRN

## 2015-11-21 MED ORDER — IBUPROFEN 800 MG PO TABS: 800.0000 mg | ORAL_TABLET | Freq: Three times a day (TID) | ORAL | Status: DC

## 2015-11-21 MED ORDER — CEPHALEXIN 250 MG PO CAPS: 250.0000 mg | ORAL_CAPSULE | Freq: Four times a day (QID) | ORAL | Status: DC

## 2015-11-21 NOTE — ED Notes (Signed)
Patient presents with pain in left shoulder blade radiating down to left lower back, denies injury to same, reports pain with deep inhalation, and urinary frequency. Rates pain 10/10.

## 2015-11-21 NOTE — Discharge Instructions (Signed)
Muscle Cramps and Spasms Muscle cramps and spasms occur when a muscle or muscles tighten and you have no control over this tightening (involuntary muscle contraction). They are a common problem and can develop in any muscle. The most common place is in the calf muscles of the leg. Both muscle cramps and muscle spasms are involuntary muscle contractions, but they also have differences:   Muscle cramps are sporadic and painful. They may last a few seconds to a quarter of an hour. Muscle cramps are often more forceful and last longer than muscle spasms.  Muscle spasms may or may not be painful. They may also last just a few seconds or much longer. CAUSES  It is uncommon for cramps or spasms to be due to a serious underlying problem. In many cases, the cause of cramps or spasms is unknown. Some common causes are:   Overexertion.   Overuse from repetitive motions (doing the same thing over and over).   Remaining in a certain position for a long period of time.   Improper preparation, form, or technique while performing a sport or activity.   Dehydration.   Injury.   Side effects of some medicines.   Abnormally low levels of the salts and ions in your blood (electrolytes), especially potassium and calcium. This could happen if you are taking water pills (diuretics) or you are pregnant.  Some underlying medical problems can make it more likely to develop cramps or spasms. These include, but are not limited to:   Diabetes.   Parkinson disease.   Hormone disorders, such as thyroid problems.   Alcohol abuse.   Diseases specific to muscles, joints, and bones.   Blood vessel disease where not enough blood is getting to the muscles.  HOME CARE INSTRUCTIONS   Stay well hydrated. Drink enough water and fluids to keep your urine clear or pale yellow.  It may be helpful to massage, stretch, and relax the affected muscle.  For tight or tense muscles, use a warm towel, heating  pad, or hot shower water directed to the affected area.  If you are sore or have pain after a cramp or spasm, applying ice to the affected area may relieve discomfort.  Put ice in a plastic bag.  Place a towel between your skin and the bag.  Leave the ice on for 15-20 minutes, 03-04 times a day.  Medicines used to treat a known cause of cramps or spasms may help reduce their frequency or severity. Only take over-the-counter or prescription medicines as directed by your caregiver. SEEK MEDICAL CARE IF:  Your cramps or spasms get more severe, more frequent, or do not improve over time.  MAKE SURE YOU:   Understand these instructions.  Will watch your condition.  Will get help right away if you are not doing well or get worse.   This information is not intended to replace advice given to you by your health care provider. Make sure you discuss any questions you have with your health care provider.   Document Released: 04/04/2002 Document Revised: 02/07/2013 Document Reviewed: 09/29/2012 Elsevier Interactive Patient Education 2016 Elsevier Inc. Urinary Tract Infection Urinary tract infections (UTIs) can develop anywhere along your urinary tract. Your urinary tract is your body's drainage system for removing wastes and extra water. Your urinary tract includes two kidneys, two ureters, a bladder, and a urethra. Your kidneys are a pair of bean-shaped organs. Each kidney is about the size of your fist. They are located below your ribs, one  on each side of your spine. CAUSES Infections are caused by microbes, which are microscopic organisms, including fungi, viruses, and bacteria. These organisms are so small that they can only be seen through a microscope. Bacteria are the microbes that most commonly cause UTIs. SYMPTOMS  Symptoms of UTIs may vary by age and gender of the patient and by the location of the infection. Symptoms in young women typically include a frequent and intense urge to  urinate and a painful, burning feeling in the bladder or urethra during urination. Older women and men are more likely to be tired, shaky, and weak and have muscle aches and abdominal pain. A fever may mean the infection is in your kidneys. Other symptoms of a kidney infection include pain in your back or sides below the ribs, nausea, and vomiting. DIAGNOSIS To diagnose a UTI, your caregiver will ask you about your symptoms. Your caregiver will also ask you to provide a urine sample. The urine sample will be tested for bacteria and white blood cells. White blood cells are made by your body to help fight infection. TREATMENT  Typically, UTIs can be treated with medication. Because most UTIs are caused by a bacterial infection, they usually can be treated with the use of antibiotics. The choice of antibiotic and length of treatment depend on your symptoms and the type of bacteria causing your infection. HOME CARE INSTRUCTIONS  If you were prescribed antibiotics, take them exactly as your caregiver instructs you. Finish the medication even if you feel better after you have only taken some of the medication.  Drink enough water and fluids to keep your urine clear or pale yellow.  Avoid caffeine, tea, and carbonated beverages. They tend to irritate your bladder.  Empty your bladder often. Avoid holding urine for long periods of time.  Empty your bladder before and after sexual intercourse.  After a bowel movement, women should cleanse from front to back. Use each tissue only once. SEEK MEDICAL CARE IF:   You have back pain.  You develop a fever.  Your symptoms do not begin to resolve within 3 days. SEEK IMMEDIATE MEDICAL CARE IF:   You have severe back pain or lower abdominal pain.  You develop chills.  You have nausea or vomiting.  You have continued burning or discomfort with urination. MAKE SURE YOU:   Understand these instructions.  Will watch your condition.  Will get help  right away if you are not doing well or get worse.   This information is not intended to replace advice given to you by your health care provider. Make sure you discuss any questions you have with your health care provider.   Document Released: 07/23/2005 Document Revised: 07/04/2015 Document Reviewed: 11/21/2011 Elsevier Interactive Patient Education [employer]

## 2015-11-21 NOTE — ED Provider Notes (Signed)
CSN: [id number]     Arrival date & time 11/21/15  1537 History   First MD Initiated Contact with Patient 11/21/15 1816     Chief Complaint  Patient presents with  . Back Pain  . Urinary Frequency     (Consider location/radiation/quality/duration/timing/severity/associated sxs/prior Treatment) HPI Comments: Patient presents to the emergency department with chief complaint of left flank pain. She states that the symptoms started yesterday. She reports some associated dysuria. She also reports urinary frequency. Additionally, patient complains of left upper back pain. She states that her muscles are tight. She denies any injuries. She has not tried taking anything for her symptoms. She denies history of the same.  The history is provided by the patient. No language interpreter was used.    Past Medical History  Diagnosis Date  . Asthma   . ADHD (attention deficit hyperactivity disorder)    History reviewed. No pertinent past surgical history. No family history on file. Social History  Substance Use Topics  . Smoking status: Current Some Day Smoker -- 1.00 packs/day for 9 years    Types: Cigarettes  . Smokeless tobacco: None  . Alcohol Use: No   OB History    No data available     Review of Systems  Constitutional: Negative for fever and chills.  Respiratory: Negative for shortness of breath.   Cardiovascular: Negative for chest pain.  Gastrointestinal: Negative for nausea, vomiting, diarrhea and constipation.  Genitourinary: Positive for dysuria.  Musculoskeletal: Positive for myalgias.  All other systems reviewed and are negative.     Allergies  Review of patient's allergies indicates no known allergies.  Home Medications   Prior to Admission medications   Medication Sig Start Date End Date Taking? Authorizing Provider  ibuprofen (ADVIL,MOTRIN) 200 MG tablet Take 400-600 mg by mouth every 6 (six) hours as needed for headache, mild pain or moderate pain.     Historical Provider, MD   LMP 11/19/2015 Physical Exam  Constitutional: She is oriented to person, place, and time. She appears well-developed and well-nourished.  HENT:  Head: Normocephalic and atraumatic.  Eyes: Conjunctivae and EOM are normal. Pupils are equal, round, and reactive to light.  Neck: Normal range of motion. Neck supple.  Cardiovascular: Normal rate and regular rhythm.  Exam reveals no gallop and no friction rub.   No murmur heard. Pulmonary/Chest: Effort normal and breath sounds normal. No respiratory distress. She has no wheezes. She has no rales. She exhibits no tenderness.  Abdominal: Soft. Bowel sounds are normal. She exhibits no distension and no mass. There is no tenderness. There is no rebound and no guarding.  No focal abdominal tenderness, no RLQ tenderness or pain at McBurney's point, no RUQ tenderness or Murphy's sign, no left-sided abdominal tenderness, no fluid wave, or signs of peritonitis   Musculoskeletal: Normal range of motion. She exhibits no edema or tenderness.  Left upper rhomboid and trapezius tender to palpation  Neurological: She is alert and oriented to person, place, and time.  Skin: Skin is warm and dry.  Psychiatric: She has a normal mood and affect. Her behavior is normal. Judgment and thought content normal.  Nursing note and vitals reviewed.   ED Course  Procedures (including critical care time) Results for orders placed or performed during the hospital encounter of 11/21/15  Urinalysis, Routine w reflex microscopic-may I&O cath if menses (not at [facility])  Result Value Ref Range   Color, Urine YELLOW YELLOW   APPearance CLOUDY (A) CLEAR   Specific Gravity, Urine  1.021 1.005 - 1.030   pH 7.0 5.0 - 8.0   Glucose, UA NEGATIVE NEGATIVE mg/dL   Hgb urine dipstick NEGATIVE NEGATIVE   Bilirubin Urine NEGATIVE NEGATIVE   Ketones, ur NEGATIVE NEGATIVE mg/dL   Protein, ur NEGATIVE NEGATIVE mg/dL   Nitrite NEGATIVE NEGATIVE   Leukocytes, UA  SMALL (A) NEGATIVE  Urine microscopic-add on  Result Value Ref Range   Squamous Epithelial / LPF 6-30 (A) NONE SEEN   WBC, UA 6-30 0 - 5 WBC/hpf   RBC / HPF 0-5 0 - 5 RBC/hpf   Bacteria, UA FEW (A) NONE SEEN   Trichomonas, UA PRESENT   I-stat chem 8, ed  Result Value Ref Range   Sodium 139 135 - 145 mmol/L   Potassium 4.1 3.5 - 5.1 mmol/L   Chloride 104 101 - 111 mmol/L   BUN 8 6 - 20 mg/dL   Creatinine, Ser 1.19 0.44 - 1.00 mg/dL   Glucose, Bld 74 65 - 99 mg/dL   Calcium, Ion 1.47 8.29 - 1.23 mmol/L   TCO2 24 0 - 100 mmol/L   Hemoglobin 15.0 12.0 - 15.0 g/dL   HCT 56.2 13.0 - 86.5 %   No results found.  I have personally reviewed and evaluated these images and lab results as part of my medical decision-making.   MDM   Final diagnoses:  Muscle spasm of back  UTI (lower urinary tract infection)    Patient with suspected UTI. Symptoms are consistent, patient has many white cells in her urine. Will treat with Keflex. Additionally, patient does have some left flank pain and left upper back pain. I suspect that her back pain is muscle spasm/trigger point pain. Will recommend NSAIDs for this, and consider muscle relaxer. We'll check kidney function. If normal plan for discharge to home.    Roxy Horseman, PA-C 11/22/15 [id number]  Donnetta Hutching, MD 11/22/15 [phone]

## 2016-01-25 ENCOUNTER — Ambulatory Visit (INDEPENDENT_AMBULATORY_CARE_PROVIDER_SITE_OTHER): Payer: PRIVATE HEALTH INSURANCE | Admitting: Urgent Care

## 2016-01-25 VITALS — BP 108/75 | HR 69 | Temp 98.2°F | Resp 16 | Ht 63.5 in | Wt 200.0 lb

## 2016-01-25 DIAGNOSIS — R631 Polydipsia: Secondary | ICD-10-CM

## 2016-01-25 DIAGNOSIS — R202 Paresthesia of skin: Secondary | ICD-10-CM | POA: Diagnosis not present

## 2016-01-25 DIAGNOSIS — R824 Acetonuria: Secondary | ICD-10-CM

## 2016-01-25 DIAGNOSIS — E669 Obesity, unspecified: Secondary | ICD-10-CM

## 2016-01-25 DIAGNOSIS — R42 Dizziness and giddiness: Secondary | ICD-10-CM

## 2016-01-25 DIAGNOSIS — R5383 Other fatigue: Secondary | ICD-10-CM

## 2016-01-25 DIAGNOSIS — R200 Anesthesia of skin: Secondary | ICD-10-CM

## 2016-01-25 LAB — POCT CBC
GRANULOCYTE PERCENT: 66.3 % (ref 37–80)
HCT, POC: 39 % (ref 37.7–47.9)
HEMOGLOBIN: 12.8 g/dL (ref 12.2–16.2)
Lymph, poc: 2.8 (ref 0.6–3.4)
MCH: 22.1 pg — AB (ref 27–31.2)
MCHC: 32.7 g/dL (ref 31.8–35.4)
MCV: 67.4 fL — AB (ref 80–97)
MID (cbc): 0.5 (ref 0–0.9)
MPV: 8.3 fL (ref 0–99.8)
PLATELET COUNT, POC: 197 10*3/uL (ref 142–424)
POC Granulocyte: 6.4 (ref 2–6.9)
POC LYMPH PERCENT: 28.7 %L (ref 10–50)
POC MID %: 5 %M (ref 0–12)
RBC: 5.79 M/uL — AB (ref 4.04–5.48)
RDW, POC: 15.1 %
WBC: 9.7 10*3/uL (ref 4.6–10.2)

## 2016-01-25 LAB — POCT URINALYSIS DIP (MANUAL ENTRY)
BILIRUBIN UA: NEGATIVE
Glucose, UA: NEGATIVE
Nitrite, UA: NEGATIVE
PH UA: 5
PROTEIN UA: NEGATIVE
SPEC GRAV UA: 1.02
Urobilinogen, UA: 0.2

## 2016-01-25 LAB — POC MICROSCOPIC URINALYSIS (UMFC)

## 2016-01-25 LAB — POCT GLYCOSYLATED HEMOGLOBIN (HGB A1C): Hemoglobin A1C: 5.5

## 2016-01-25 LAB — GLUCOSE, POCT (MANUAL RESULT ENTRY): POC Glucose: 68 mg/dl — AB (ref 70–99)

## 2016-01-25 NOTE — Progress Notes (Signed)
MRN: [id number] DOB: August 18, 1986  Subjective:   Amy GibbsChiquita Hulick is a 30 y.o. female presenting for chief complaint of Nausea; Dizziness; Fatigue; and Tingling  Reports 4 day history of fatigue, intermittent dizziness, nausea. Now having tingling in her left hand. Also admits polydipsia, occasional blurred vision. Has not tried any medications for her symptoms. Denies chest pain, shob, abdominal pain, polyuria. Denies history of HTN, Diabetes. Does not know how much water she drinks. Just stopped drinking soda this past week. She is also undergoing a new strict diet but is not consuming a lot of meat. Smokes 1.5ppd, has 10 year pack history. Drinks alcohol occasionally.   Michael BostonChiquita currently has no medications in their medication list. Also has No Known Allergies.  Michael BostonChiquita  has a past medical history of Asthma and ADHD (attention deficit hyperactivity disorder). Also  has no past surgical history on file.  Denies family history of diabetes, heart disease, hypertension.  Objective:   Vitals: BP 108/76 mmHg  Pulse 90  Temp(Src) 98.2 F (36.8 C)  Resp 16  Ht 5' 3.5" (1.613 m)  Wt 200 lb (90.719 kg)  BMI 34.87 kg/m2  Physical Exam  Constitutional: She is oriented to person, place, and time. She appears well-developed and well-nourished.  HENT:  Mouth/Throat: Oropharynx is clear and moist.  Eyes: EOM are normal. Pupils are equal, round, and reactive to light. Right eye exhibits no discharge. Left eye exhibits no discharge. No scleral icterus.  Neck: Normal range of motion. Neck supple. No thyromegaly present.  Cardiovascular: Normal rate, regular rhythm and intact distal pulses.  Exam reveals no gallop and no friction rub.   No murmur heard. Pulmonary/Chest: No respiratory distress. She has no wheezes. She has no rales.  Abdominal: Soft. Bowel sounds are normal. She exhibits no distension and no mass. There is tenderness (generalized, upper abdominal).  Musculoskeletal: She exhibits  no edema or tenderness.  Neurological: She is alert and oriented to person, place, and time.  Skin: Skin is warm and dry.   Results for orders placed or performed in visit on 01/25/16 (from the past 24 hour(s))  POCT CBC     Status: Abnormal   Collection Time: 01/25/16  6:07 PM  Result Value Ref Range   WBC 9.7 4.6 - 10.2 K/uL   Lymph, poc 2.8 0.6 - 3.4   POC LYMPH PERCENT 28.7 10 - 50 %L   MID (cbc) 0.5 0 - 0.9   POC MID % 5.0 0 - 12 %M   POC Granulocyte 6.4 2 - 6.9   Granulocyte percent 66.3 37 - 80 %G   RBC 5.79 (A) 4.04 - 5.48 M/uL   Hemoglobin 12.8 12.2 - 16.2 g/dL   HCT, POC 40.939.0 81.137.7 - 47.9 %   MCV 67.4 (A) 80 - 97 fL   MCH, POC 22.1 (A) 27 - 31.2 pg   MCHC 32.7 31.8 - 35.4 g/dL   RDW, POC 91.415.1 %   Platelet Count, POC 197 142 - 424 K/uL   MPV 8.3 0 - 99.8 fL  POCT urinalysis dipstick     Status: Abnormal   Collection Time: 01/25/16  6:08 PM  Result Value Ref Range   Color, UA yellow yellow   Clarity, UA cloudy (A) clear   Glucose, UA negative negative   Bilirubin, UA negative negative   Ketones, POC UA >= (160) (A) negative   Spec Grav, UA 1.020    Blood, UA small (A) negative   pH, UA 5.0  Protein Ur, POC negative negative   Urobilinogen, UA 0.2    Nitrite, UA Negative Negative   Leukocytes, UA small (1+) (A) Negative  POCT glycosylated hemoglobin (Hb A1C)     Status: Normal   Collection Time: 01/25/16  6:14 PM  Result Value Ref Range   Hemoglobin A1C 5.5   POCT Microscopic Urinalysis ([facility])     Status: Abnormal   Collection Time: 01/25/16  6:14 PM  Result Value Ref Range   WBC,UR,HPF,POC Moderate (A) None WBC/hpf   RBC,UR,HPF,POC Few (A) None RBC/hpf   Bacteria Few (A) None, Too numerous to count   Mucus Present (A) Absent   Epithelial Cells, UR Per Microscopy Moderate (A) None, Too numerous to count cells/hpf  POCT glucose (manual entry)     Status: Abnormal   Collection Time: 01/25/16  6:15 PM  Result Value Ref Range   POC Glucose 68 (A) 70 - 99  mg/dl   Assessment and Plan :   This case was precepted with Dr. Milus Glazier.   1. Other fatigue 2. Dizziness - Unclear etiology. Orthostatics are good. Her dizziness and fatigue may be due to her dieting which the patient admits that she does not eat breakfast, hydrate well. Labs are pending, consider referral to Neurology if dizziness persists despite dietary modifications.  3. Polydipsia 4. Obesity 5. Numbness and tingling in left hand - Negative for diabetes, monitor.  6. Ketonuria - May be related to her dieting, lack of hydration. Monitor.  [name], PA-C [facility] [phone] 01/25/2016 5:36 PM

## 2016-01-25 NOTE — Patient Instructions (Addendum)

## 2016-01-26 LAB — COMPREHENSIVE METABOLIC PANEL
ALT: 16 U/L (ref 6–29)
AST: 20 U/L (ref 10–30)
Albumin: 4.4 g/dL (ref 3.6–5.1)
Alkaline Phosphatase: 62 U/L (ref 33–115)
BUN: 23 mg/dL (ref 7–25)
CHLORIDE: 101 mmol/L (ref 98–110)
CO2: 23 mmol/L (ref 20–31)
Calcium: 9.5 mg/dL (ref 8.6–10.2)
Creat: 0.84 mg/dL (ref 0.50–1.10)
GLUCOSE: 51 mg/dL — AB (ref 65–99)
POTASSIUM: 4.3 mmol/L (ref 3.5–5.3)
Sodium: 137 mmol/L (ref 135–146)
TOTAL PROTEIN: 7.3 g/dL (ref 6.1–8.1)
Total Bilirubin: 0.8 mg/dL (ref 0.2–1.2)

## 2016-01-26 LAB — TSH: TSH: 0.81 m[IU]/L

## 2016-01-28 ENCOUNTER — Telehealth: Payer: Self-pay

## 2016-01-28 ENCOUNTER — Ambulatory Visit (INDEPENDENT_AMBULATORY_CARE_PROVIDER_SITE_OTHER): Payer: PRIVATE HEALTH INSURANCE

## 2016-01-28 ENCOUNTER — Ambulatory Visit (INDEPENDENT_AMBULATORY_CARE_PROVIDER_SITE_OTHER): Payer: PRIVATE HEALTH INSURANCE | Admitting: Urgent Care

## 2016-01-28 VITALS — BP 130/80 | HR 79 | Temp 97.4°F | Resp 17 | Ht 64.5 in | Wt 205.0 lb

## 2016-01-28 DIAGNOSIS — R109 Unspecified abdominal pain: Secondary | ICD-10-CM

## 2016-01-28 DIAGNOSIS — N200 Calculus of kidney: Secondary | ICD-10-CM

## 2016-01-28 DIAGNOSIS — N23 Unspecified renal colic: Secondary | ICD-10-CM | POA: Diagnosis not present

## 2016-01-28 LAB — POC MICROSCOPIC URINALYSIS (UMFC): MUCUS RE: ABSENT

## 2016-01-28 LAB — POCT URINALYSIS DIP (MANUAL ENTRY)
Bilirubin, UA: NEGATIVE
GLUCOSE UA: NEGATIVE
NITRITE UA: NEGATIVE
Spec Grav, UA: 1.025
UROBILINOGEN UA: 0.2
pH, UA: 5.5

## 2016-01-28 MED ORDER — HYDROCODONE-ACETAMINOPHEN 5-325 MG PO TABS: 1.0000 | ORAL_TABLET | Freq: Four times a day (QID) | ORAL | Status: DC | PRN

## 2016-01-28 MED ORDER — TAMSULOSIN HCL 0.4 MG PO CAPS: 0.4000 mg | ORAL_CAPSULE | Freq: Every day | ORAL | Status: DC

## 2016-01-28 NOTE — Telephone Encounter (Signed)
[facility] and have questions regarding [facility] lab results. Please call [phone]

## 2016-01-28 NOTE — Patient Instructions (Addendum)
Renal Colic Renal colic is pain that is caused by passing a kidney stone. The pain can be sharp and severe. It may be felt in the back, abdomen, side (flank), or groin. It can cause nausea. Renal colic can come and go. HOME CARE INSTRUCTIONS Watch your condition for any changes. The following actions may help to lessen any discomfort that you are feeling:  Take medicines only as directed by your health care provider.  Ask your health care provider if it is okay to take over-the-counter pain medicine.  Drink enough fluid to keep your urine clear or pale yellow. Drink 6-8 glasses of water each day.  Limit the amount of salt that you eat to less than 2 grams per day.  Reduce the amount of protein in your diet. Eat less meat, fish, nuts, and dairy.  Avoid foods such as spinach, rhubarb, nuts, or bran. These may make kidney stones more likely to form. SEEK MEDICAL CARE IF:  You have a fever or chills.  Your urine smells bad or looks cloudy.  You have pain or burning when you pass urine. SEEK IMMEDIATE MEDICAL CARE IF:  Your flank pain or groin pain suddenly worsens.  You become confused or disoriented or you lose consciousness.   This information is not intended to replace advice given to you by your health care provider. Make sure you discuss any questions you have with your health care provider.   Document Released: 07/23/2005 Document Revised: 11/03/2014 Document Reviewed: 08/23/2014 Elsevier Interactive Patient Education 2016 Elsevier Inc.     IF you received an x-ray today, you will receive an invoice from [facility]. Please contact [facility] at [phone] with questions or concerns regarding your invoice.   IF you received labwork today, you will receive an invoice from [facility]. Please contact [facility] at [phone] with questions or concerns regarding your invoice.   Our billing staff will not be able to assist you  with questions regarding bills from these companies.  You will be contacted with the lab results as soon as they are available. The fastest way to get your results is to activate your My Chart account. Instructions are located on the last page of this paperwork. If you have not heard from us regarding the results in 2 weeks, please contact this office.      

## 2016-01-28 NOTE — Progress Notes (Signed)
MRN: [id number] DOB: 04/01/1986  Subjective:   Amy Sexton is a 30 y.o. female presenting for chief complaint of Abdominal Pain  Reports 3 day history of left sided abdominal pain, worse with laying on her back or standing. Also has chills. She is currently on her menstrual cycle, is regular. Denies fever, n/v, diarrhea, constipation, straining, dysuria, cloudy urine. She is trying to hydrate better and eat more healthily.   Michael BostonChiquita currently has no medications in their medication list. Also has No Known Allergies.  Michael BostonChiquita  has a past medical history of Asthma and ADHD (attention deficit hyperactivity disorder). Also  has no past surgical history on file.  Objective:   Vitals: BP 130/80 mmHg  Pulse 79  Temp(Src) 97.4 F (36.3 C) (Oral)  Resp 17  Ht 5' 4.5" (1.638 m)  Wt 205 lb (92.987 kg)  BMI 34.66 kg/m2  SpO2 97%  LMP 01/28/2016  Physical Exam  Constitutional: She is oriented to person, place, and time. She appears well-developed and well-nourished.  Cardiovascular: Normal rate, regular rhythm and intact distal pulses.  Exam reveals no gallop and no friction rub.   No murmur heard. Pulmonary/Chest: No respiratory distress. She has no wheezes. She has no rales.  Abdominal: Soft. Bowel sounds are normal. She exhibits no distension and no mass. There is tenderness.  Left CVA tenderness.  Neurological: She is alert and oriented to person, place, and time.  Skin: Skin is warm and dry.   Results for orders placed or performed in visit on 01/28/16 (from the past 24 hour(s))  POCT urinalysis dipstick     Status: Abnormal   Collection Time: 01/28/16  5:39 PM  Result Value Ref Range   Color, UA yellow yellow   Clarity, UA cloudy (A) clear   Glucose, UA negative negative   Bilirubin, UA negative negative   Ketones, POC UA small (15) (A) negative   Spec Grav, UA 1.025    Blood, UA large (A) negative   pH, UA 5.5    Protein Ur, POC =30 (A) negative   Urobilinogen, UA  0.2    Nitrite, UA Negative Negative   Leukocytes, UA Trace (A) Negative  POCT Microscopic Urinalysis ([facility])     Status: Abnormal   Collection Time: 01/28/16  5:39 PM  Result Value Ref Range   WBC,UR,HPF,POC Few (A) None WBC/hpf   RBC,UR,HPF,POC Too numerous to count  (A) None RBC/hpf   Bacteria Few (A) None, Too numerous to count   Mucus Absent Absent   Epithelial Cells, UR Per Microscopy Few (A) None, Too numerous to count cells/hpf    Dg Abd 1 View  01/28/2016  CLINICAL DATA:  LEFT side abdominal pain EXAM: ABDOMEN - 1 VIEW COMPARISON:  None FINDINGS: Normal bowel gas pattern. No bowel dilatation or bowel wall thickening. Questionable 13 x 5 mm nonobstructing LEFT renal calculus versus bowel artifact. No other potential urinary tract calcifications. Single tiny phlebolith in RIGHT pelvis. Osseous structures normal. IMPRESSION: Normal bowel gas pattern. Questionable 13 x 5 mm nonobstructing LEFT renal calculus versus bowel artifact. Electronically Signed   By: Ulyses SouthwardMark  Boles M.D.   On: 01/28/2016 17:48   Assessment and Plan :   1. Renal stone 2. Left sided abdominal pain 3. Renal colic on left side - Counseled patient on diagnosis. She is to start hydrating very aggressively. Hydrocodone for pain. Strain urine. Cr was very reassuring from most recent labs. Rtc if signs of obstruction develop, patient verbalized understanding.  Wallis BambergMario Akaya Proffit, PA-C  [facility] [phone] 01/28/2016 5:13 PM

## 2016-01-31 NOTE — Telephone Encounter (Signed)
SHe was here yesterday

## 2016-02-01 ENCOUNTER — Encounter (HOSPITAL_COMMUNITY): Payer: Self-pay | Admitting: Emergency Medicine

## 2016-02-01 ENCOUNTER — Emergency Department (HOSPITAL_COMMUNITY)
Admission: EM | Admit: 2016-02-01 | Discharge: 2016-02-01 | Disposition: A | Discharge status: Home / Self Care | Payer: Self-pay | Attending: Emergency Medicine | Admitting: Emergency Medicine

## 2016-02-01 ENCOUNTER — Emergency Department (HOSPITAL_COMMUNITY): Payer: Self-pay

## 2016-02-01 DIAGNOSIS — N23 Unspecified renal colic: Secondary | ICD-10-CM | POA: Insufficient documentation

## 2016-02-01 DIAGNOSIS — J45909 Unspecified asthma, uncomplicated: Secondary | ICD-10-CM | POA: Insufficient documentation

## 2016-02-01 DIAGNOSIS — F17210 Nicotine dependence, cigarettes, uncomplicated: Secondary | ICD-10-CM | POA: Insufficient documentation

## 2016-02-01 DIAGNOSIS — Z8659 Personal history of other mental and behavioral disorders: Secondary | ICD-10-CM | POA: Insufficient documentation

## 2016-02-01 DIAGNOSIS — Z79899 Other long term (current) drug therapy: Secondary | ICD-10-CM | POA: Insufficient documentation

## 2016-02-01 LAB — CBC WITH DIFFERENTIAL/PLATELET
BASOS ABS: 0 10*3/uL (ref 0.0–0.1)
BASOS PCT: 0 %
EOS ABS: 0.6 10*3/uL (ref 0.0–0.7)
Eosinophils Relative: 7 %
HCT: 37.8 % (ref 36.0–46.0)
Hemoglobin: 12.3 g/dL (ref 12.0–15.0)
Lymphocytes Relative: 40 %
Lymphs Abs: 3.2 10*3/uL (ref 0.7–4.0)
MCH: 22.3 pg — AB (ref 26.0–34.0)
MCHC: 32.5 g/dL (ref 30.0–36.0)
MCV: 68.5 fL — ABNORMAL LOW (ref 78.0–100.0)
MONO ABS: 0.5 10*3/uL (ref 0.1–1.0)
Monocytes Relative: 6 %
NEUTROS PCT: 47 %
Neutro Abs: 3.6 10*3/uL (ref 1.7–7.7)
PLATELETS: 219 10*3/uL (ref 150–400)
RBC: 5.52 MIL/uL — ABNORMAL HIGH (ref 3.87–5.11)
RDW: 16.1 % — ABNORMAL HIGH (ref 11.5–15.5)
WBC: 7.9 10*3/uL (ref 4.0–10.5)

## 2016-02-01 LAB — URINALYSIS, ROUTINE W REFLEX MICROSCOPIC
BILIRUBIN URINE: NEGATIVE
Glucose, UA: NEGATIVE mg/dL
HGB URINE DIPSTICK: NEGATIVE
Ketones, ur: 15 mg/dL — AB
NITRITE: NEGATIVE
PROTEIN: NEGATIVE mg/dL
Specific Gravity, Urine: 1.028 (ref 1.005–1.030)
pH: 5.5 (ref 5.0–8.0)

## 2016-02-01 LAB — BASIC METABOLIC PANEL
ANION GAP: 9 (ref 5–15)
BUN: 13 mg/dL (ref 6–20)
CALCIUM: 9.5 mg/dL (ref 8.9–10.3)
CO2: 25 mmol/L (ref 22–32)
CREATININE: 0.8 mg/dL (ref 0.44–1.00)
Chloride: 107 mmol/L (ref 101–111)
Glucose, Bld: 73 mg/dL (ref 65–99)
Potassium: 3.7 mmol/L (ref 3.5–5.1)
SODIUM: 141 mmol/L (ref 135–145)

## 2016-02-01 LAB — URINE MICROSCOPIC-ADD ON

## 2016-02-01 MED ORDER — KETOROLAC TROMETHAMINE 30 MG/ML IJ SOLN
30.0000 mg | Freq: Once | INTRAMUSCULAR | Status: AC
  Administered 2016-02-01: 30 mg via INTRAVENOUS
  Filled 2016-02-01: qty 1

## 2016-02-01 MED ORDER — MORPHINE SULFATE (PF) 4 MG/ML IV SOLN
4.0000 mg | Freq: Once | INTRAVENOUS | Status: AC
  Administered 2016-02-01: 4 mg via INTRAVENOUS
  Filled 2016-02-01: qty 1

## 2016-02-01 MED ORDER — ONDANSETRON HCL 4 MG/2ML IJ SOLN
4.0000 mg | Freq: Once | INTRAMUSCULAR | Status: AC
  Administered 2016-02-01: 4 mg via INTRAVENOUS
  Filled 2016-02-01: qty 2

## 2016-02-01 NOTE — Discharge Instructions (Signed)
Continue your pain medications as previously prescribed.  Follow up with your primary Dr. next week if you're not improving.   Kidney Stones Kidney stones (urolithiasis) are deposits that form inside your kidneys. The intense pain is caused by the stone moving through the urinary tract. When the stone moves, the ureter goes into spasm around the stone. The stone is usually passed in the urine.  CAUSES   A disorder that makes certain neck glands produce too much parathyroid hormone (primary hyperparathyroidism).  A buildup of uric acid crystals, similar to gout in your joints.  Narrowing (stricture) of the ureter.  A kidney obstruction present at birth (congenital obstruction).  Previous surgery on the kidney or ureters.  Numerous kidney infections. SYMPTOMS   Feeling sick to your stomach (nauseous).  Throwing up (vomiting).  Blood in the urine (hematuria).  Pain that usually spreads (radiates) to the groin.  Frequency or urgency of urination. DIAGNOSIS   Taking a history and physical exam.  Blood or urine tests.  CT scan.  Occasionally, an examination of the inside of the urinary bladder (cystoscopy) is performed. TREATMENT   Observation.  Increasing your fluid intake.  Extracorporeal shock wave lithotripsy--This is a noninvasive procedure that uses shock waves to break up kidney stones.  Surgery may be needed if you have severe pain or persistent obstruction. There are various surgical procedures. Most of the procedures are performed with the use of small instruments. Only small incisions are needed to accommodate these instruments, so recovery time is minimized. The size, location, and chemical composition are all important variables that will determine the proper choice of action for you. Talk to your health care provider to better understand your situation so that you will minimize the risk of injury to yourself and your kidney.  HOME CARE INSTRUCTIONS   Drink  enough water and fluids to keep your urine clear or pale yellow. This will help you to pass the stone or stone fragments.  Strain all urine through the provided strainer. Keep all particulate matter and stones for your health care provider to see. The stone causing the pain may be as small as a grain of salt. It is very important to use the strainer each and every time you pass your urine. The collection of your stone will allow your health care provider to analyze it and verify that a stone has actually passed. The stone analysis will often identify what you can do to reduce the incidence of recurrences.  Only take over-the-counter or prescription medicines for pain, discomfort, or fever as directed by your health care provider.  Keep all follow-up visits as told by your health care provider. This is important.  Get follow-up X-rays if required. The absence of pain does not always mean that the stone has passed. It may have only stopped moving. If the urine remains completely obstructed, it can cause loss of kidney function or even complete destruction of the kidney. It is your responsibility to make sure X-rays and follow-ups are completed. Ultrasounds of the kidney can show blockages and the status of the kidney. Ultrasounds are not associated with any radiation and can be performed easily in a matter of minutes.  Make changes to your daily diet as told by your health care provider. You may be told to:  Limit the amount of salt that you eat.  Eat 5 or more servings of fruits and vegetables each day.  Limit the amount of meat, poultry, fish, and eggs that you eat.  Collect a 24-hour urine sample as told by your health care provider.You may need to collect another urine sample every 6-12 months. SEEK MEDICAL CARE IF:  You experience pain that is progressive and unresponsive to any pain medicine you have been prescribed. SEEK IMMEDIATE MEDICAL CARE IF:   Pain cannot be controlled with the  prescribed medicine.  You have a fever or shaking chills.  The severity or intensity of pain increases over 18 hours and is not relieved by pain medicine.  You develop a new onset of abdominal pain.  You feel faint or pass out.  You are unable to urinate.   This information is not intended to replace advice given to you by your health care provider. Make sure you discuss any questions you have with your health care provider.   Document Released: 10/13/2005 Document Revised: 07/04/2015 Document Reviewed: 03/16/2013 Elsevier Interactive Patient Education [employer].

## 2016-02-01 NOTE — ED Notes (Signed)
Pt c/o left flank pain, nausea, emesis onset Saturday. Pt has documented 13 mm renal calculus, was given pain medications and a sieve to catch calculus, pt reports she has not passed the calculus. Pt reports episodes of blood in urine but states she was menstruating and this may have been the cause.

## 2016-02-01 NOTE — ED Provider Notes (Signed)
CSN: [id number]     Arrival date & time 02/01/16  1840 History   First MD Initiated Contact with Patient 02/01/16 1908     Chief Complaint  Patient presents with  . Flank Pain     (Consider location/radiation/quality/duration/timing/severity/associated sxs/prior Treatment) HPI Comments: Patient is an otherwise healthy 30 year old female who presents with complaints of left flank pain. This started 4-5 days ago in the absence of any injury or trauma. She was seen in urgent care and had a urinalysis and x-ray performed. She was told that she had a large renal calculus and treated with pain medication. She presents today stating that her pain is worse rigid she denies any fevers or chills. She denies any nausea or vomiting.  Patient is a 30 y.o. female presenting with flank pain. The history is provided by the patient.  Flank Pain This is a new problem. Episode onset: 4 days ago. The problem occurs constantly. The problem has not changed since onset.Nothing aggravates the symptoms. Nothing relieves the symptoms. She has tried nothing for the symptoms. The treatment provided no relief.    Past Medical History  Diagnosis Date  . Asthma   . ADHD (attention deficit hyperactivity disorder)    History reviewed. No pertinent past surgical history. History reviewed. No pertinent family history. Social History  Substance Use Topics  . Smoking status: Current Some Day Smoker -- 1.50 packs/day for 10 years    Types: Cigarettes  . Smokeless tobacco: None  . Alcohol Use: No   OB History    No data available     Review of Systems  Genitourinary: Positive for flank pain.  All other systems reviewed and are negative.     Allergies  Review of patient's allergies indicates no known allergies.  Home Medications   Prior to Admission medications   Medication Sig Start Date End Date Taking? Authorizing Provider  HYDROcodone-acetaminophen (NORCO) 5-325 MG tablet Take 1 tablet by mouth every 6  (six) hours as needed. Patient taking differently: Take 1 tablet by mouth every 6 (six) hours as needed for moderate pain or severe pain.  01/28/16  Yes [name], PA-C  tamsulosin (FLOMAX) 0.4 MG CAPS capsule Take 1 capsule (0.4 mg total) by mouth daily. 01/28/16  Yes [name], PA-C   BP 117/88 mmHg  Pulse 72  Temp(Src) 98.6 F (37 C) (Oral)  Resp 20  SpO2 99%  LMP 01/28/2016 Physical Exam  Constitutional: She is oriented to person, place, and time. She appears well-developed and well-nourished. No distress.  HENT:  Head: Normocephalic and atraumatic.  Neck: Normal range of motion. Neck supple.  Cardiovascular: Normal rate and regular rhythm.  Exam reveals no gallop and no friction rub.   No murmur heard. Pulmonary/Chest: Effort normal and breath sounds normal. No respiratory distress. She has no wheezes.  Abdominal: Soft. Bowel sounds are normal. She exhibits no distension. There is tenderness. There is no rebound and no guarding.  There is tenderness to palpation in the left flank and left upper quadrant.  Musculoskeletal: Normal range of motion.  Neurological: She is alert and oriented to person, place, and time.  Skin: Skin is warm and dry. She is not diaphoretic.  Nursing note and vitals reviewed.   ED Course  Procedures (including critical care time) Labs Review Labs Reviewed  BASIC METABOLIC PANEL  CBC WITH DIFFERENTIAL/PLATELET  URINALYSIS, ROUTINE W REFLEX MICROSCOPIC (NOT AT [facility])    Imaging Review No results found. I have personally reviewed and evaluated these images  and lab results as part of my medical decision-making.    MDM   Final diagnoses:  None    CT scan reveals no urolithiasis, however does show mild prominence of the left renal pelvis, consistent with possible recently passed stone. Laboratory studies are otherwise unremarkable and no other pathology is identified on the CT scan which would explain her pain. She will be discharged with continued  pain medication and the diagnosis of renal colic.    Geoffery Lyonsouglas Smantha Boakye, MD 02/01/16 2049

## 2016-03-03 ENCOUNTER — Emergency Department (HOSPITAL_COMMUNITY): Payer: PRIVATE HEALTH INSURANCE

## 2016-03-03 ENCOUNTER — Emergency Department (HOSPITAL_COMMUNITY)
Admission: EM | Admit: 2016-03-03 | Discharge: 2016-03-04 | Disposition: A | Discharge status: Home / Self Care | Payer: PRIVATE HEALTH INSURANCE | Attending: Emergency Medicine | Admitting: Emergency Medicine

## 2016-03-03 DIAGNOSIS — R5383 Other fatigue: Secondary | ICD-10-CM | POA: Diagnosis not present

## 2016-03-03 DIAGNOSIS — R079 Chest pain, unspecified: Secondary | ICD-10-CM | POA: Diagnosis not present

## 2016-03-03 DIAGNOSIS — Z8659 Personal history of other mental and behavioral disorders: Secondary | ICD-10-CM | POA: Diagnosis not present

## 2016-03-03 DIAGNOSIS — R51 Headache: Secondary | ICD-10-CM | POA: Diagnosis not present

## 2016-03-03 DIAGNOSIS — F17210 Nicotine dependence, cigarettes, uncomplicated: Secondary | ICD-10-CM | POA: Diagnosis not present

## 2016-03-03 DIAGNOSIS — Z79899 Other long term (current) drug therapy: Secondary | ICD-10-CM | POA: Insufficient documentation

## 2016-03-03 DIAGNOSIS — J45909 Unspecified asthma, uncomplicated: Secondary | ICD-10-CM | POA: Insufficient documentation

## 2016-03-03 DIAGNOSIS — R110 Nausea: Secondary | ICD-10-CM | POA: Insufficient documentation

## 2016-03-03 DIAGNOSIS — G44209 Tension-type headache, unspecified, not intractable: Secondary | ICD-10-CM

## 2016-03-03 LAB — BASIC METABOLIC PANEL
ANION GAP: 9 (ref 5–15)
BUN: 9 mg/dL (ref 6–20)
CHLORIDE: 105 mmol/L (ref 101–111)
CO2: 23 mmol/L (ref 22–32)
Calcium: 9.3 mg/dL (ref 8.9–10.3)
Creatinine, Ser: 0.79 mg/dL (ref 0.44–1.00)
GFR calc non Af Amer: >60 mL/min (ref >60)
Glucose, Bld: 82 mg/dL (ref 65–99)
Potassium: 4 mmol/L (ref 3.5–5.1)
Sodium: 137 mmol/L (ref 135–145)

## 2016-03-03 LAB — I-STAT TROPONIN, ED: TROPONIN I, POC: 0 ng/mL (ref 0.00–0.08)

## 2016-03-03 LAB — CBC
HCT: 36 % (ref 36.0–46.0)
HEMOGLOBIN: 11.8 g/dL — AB (ref 12.0–15.0)
MCH: 22.5 pg — AB (ref 26.0–34.0)
MCHC: 32.8 g/dL (ref 30.0–36.0)
MCV: 68.6 fL — AB (ref 78.0–100.0)
Platelets: 209 10*3/uL (ref 150–400)
RBC: 5.25 MIL/uL — AB (ref 3.87–5.11)
RDW: 16.8 % — ABNORMAL HIGH (ref 11.5–15.5)
WBC: 9 10*3/uL (ref 4.0–10.5)

## 2016-03-03 MED ORDER — OXYCODONE-ACETAMINOPHEN 5-325 MG PO TABS
1.0000 | ORAL_TABLET | ORAL | Status: DC | PRN
  Administered 2016-03-03: 1 via ORAL
  Filled 2016-03-03: qty 1

## 2016-03-03 NOTE — ED Notes (Signed)
Pt states she has had chest pain and headache x 3 days. Neuro intact. Alert and oriented.

## 2016-03-03 NOTE — ED Notes (Signed)
Pain medication given in Triage. Patient advised about side effects of medications and  to avoid driving for a minimum of 4 hours.  

## 2016-03-04 ENCOUNTER — Other Ambulatory Visit: Payer: Self-pay | Admitting: Urgent Care

## 2016-03-04 MED ORDER — OMEPRAZOLE 20 MG PO CPDR: 20.0000 mg | DELAYED_RELEASE_CAPSULE | Freq: Every day | ORAL | Status: DC

## 2016-03-04 MED ORDER — NAPROXEN 500 MG PO TABS: 500.0000 mg | ORAL_TABLET | Freq: Two times a day (BID) | ORAL | Status: DC

## 2016-03-04 NOTE — Discharge Instructions (Signed)
Nonspecific Chest Pain  Chest pain can be caused by many different conditions. There is always a chance that your pain could be related to something serious, such as a heart attack or a blood clot in your lungs. Chest pain can also be caused by conditions that are not life-threatening. If you have chest pain, it is very important to follow up with your health care provider. CAUSES  Chest pain can be caused by:  Heartburn.  Pneumonia or bronchitis.  Anxiety or stress.  Inflammation around your heart (pericarditis) or lung (pleuritis or pleurisy).  A blood clot in your lung.  A collapsed lung (pneumothorax). It can develop suddenly on its own (spontaneous pneumothorax) or from trauma to the chest.  Shingles infection (varicella-zoster virus).  Heart attack.  Damage to the bones, muscles, and cartilage that make up your chest wall. This can include:  Bruised bones due to injury.  Strained muscles or cartilage due to frequent or repeated coughing or overwork.  Fracture to one or more ribs.  Sore cartilage due to inflammation (costochondritis). RISK FACTORS  Risk factors for chest pain may include:  Activities that increase your risk for trauma or injury to your chest.  Respiratory infections or conditions that cause frequent coughing.  Medical conditions or overeating that can cause heartburn.  Heart disease or family history of heart disease.  Conditions or health behaviors that increase your risk of developing a blood clot.  Having had chicken pox (varicella zoster). SIGNS AND SYMPTOMS Chest pain can feel like:  Burning or tingling on the surface of your chest or deep in your chest.  Crushing, pressure, aching, or squeezing pain.  Dull or sharp pain that is worse when you move, cough, or take a deep breath.  Pain that is also felt in your back, neck, shoulder, or arm, or pain that spreads to any of these areas. Your chest pain may come and go, or it may stay  constant. DIAGNOSIS Lab tests or other studies may be needed to find the cause of your pain. Your health care provider may have you take a test called an ambulatory ECG (electrocardiogram). An ECG records your heartbeat patterns at the time the test is performed. You may also have other tests, such as:  Transthoracic echocardiogram (TTE). During echocardiography, sound waves are used to create a picture of all of the heart structures and to look at how blood flows through your heart.  Transesophageal echocardiogram (TEE).This is a more advanced imaging test that obtains images from inside your body. It allows your health care provider to see your heart in finer detail.  Cardiac monitoring. This allows your health care provider to monitor your heart rate and rhythm in real time.  Holter monitor. This is a portable device that records your heartbeat and can help to diagnose abnormal heartbeats. It allows your health care provider to track your heart activity for several days, if needed.  Stress tests. These can be done through exercise or by taking medicine that makes your heart beat more quickly.  Blood tests.  Imaging tests. TREATMENT  Your treatment depends on what is causing your chest pain. Treatment may include:  Medicines. These may include:  Acid blockers for heartburn.  Anti-inflammatory medicine.  Pain medicine for inflammatory conditions.  Antibiotic medicine, if an infection is present.  Medicines to dissolve blood clots.  Medicines to treat coronary artery disease.  Supportive care for conditions that do not require medicines. This may include:  Resting.  Applying heat  or cold packs to injured areas. °¨ Limiting activities until pain decreases. °HOME CARE INSTRUCTIONS °· If you were prescribed an antibiotic medicine, finish it all even if you start to feel better. °· Avoid any activities that bring on chest pain. °· Do not use any tobacco products, including  cigarettes, chewing tobacco, or electronic cigarettes. If you need help quitting, ask your health care provider. °· Do not drink alcohol. °· Take medicines only as directed by your health care provider. °· Keep all follow-up visits as directed by your health care provider. This is important. This includes any further testing if your chest pain does not go away. °· If heartburn is the cause for your chest pain, you may be told to keep your head raised (elevated) while sleeping. This reduces the chance that acid will go from your stomach into your esophagus. °· Make lifestyle changes as directed by your health care provider. These may include: °¨ Getting regular exercise. Ask your health care provider to suggest some activities that are safe for you. °¨ Eating a heart-healthy diet. A registered dietitian can help you to learn healthy eating options. °¨ Maintaining a healthy weight. °¨ Managing diabetes, if necessary. °¨ Reducing stress. °SEEK MEDICAL CARE IF: °· Your chest pain does not go away after treatment. °· You have a rash with blisters on your chest. °· You have a fever. °SEEK IMMEDIATE MEDICAL CARE IF:  °· Your chest pain is worse. °· You have an increasing cough, or you cough up blood. °· You have severe abdominal pain. °· You have severe weakness. °· You faint. °· You have chills. °· You have sudden, unexplained chest discomfort. °· You have sudden, unexplained discomfort in your arms, back, neck, or jaw. °· You have shortness of breath at any time. °· You suddenly start to sweat, or your skin gets clammy. °· You feel nauseous or you vomit. °· You suddenly feel light-headed or dizzy. °· Your heart begins to beat quickly, or it feels like it is skipping beats. °These symptoms may represent a serious problem that is an emergency. Do not wait to see if the symptoms will go away. Get medical help right away. Call your local emergency services (911 in the [place] Do not drive yourself to the hospital. °  °This  information is not intended to replace advice given to you by your health care provider. Make sure you discuss any questions you have with your health care provider. °  °Document Released: 07/23/2005 Document Revised: 11/03/2014 Document Reviewed: 05/19/2014 °Elsevier Interactive Patient Education ©2016 Elsevier Inc. °Tension Headache  °A tension headache is a feeling of pain, pressure, or aching that is often felt over the front and sides of the head. The pain can be dull, or it can feel tight (constricting). Tension headaches are not normally associated with nausea or vomiting, and they do not get worse with physical activity. Tension headaches can last from 30 minutes to several days. This is the most common type of headache.  °CAUSES  °The exact cause of this condition is not known. Tension headaches often begin after stress, anxiety, or depression. Other triggers may include:  °Alcohol.  °Too much caffeine, or caffeine withdrawal.  °Respiratory infections, such as colds, flu, or sinus infections.  °Dental problems or teeth clenching.  °Fatigue.  °Holding your head and neck in the same position for a long period of time, such as while using a computer.  °Smoking. °SYMPTOMS  °Symptoms of this condition include:  °A feeling   the head.  Dull, aching head pain.  Pain felt over the front and sides of the head.  Tenderness in the muscles of the head, neck, and shoulders. DIAGNOSIS This condition may be diagnosed based on your symptoms and a physical exam. Tests may be done, such as a CT scan or an MRI of your head. These tests may be done if your symptoms are severe or unusual. TREATMENT This condition may be treated with lifestyle changes and medicines to help relieve symptoms. HOME CARE INSTRUCTIONS Managing Pain  Take over-the-counter and prescription medicines only as told by your health care provider.  Lie down in a dark, quiet room when you have a headache.  If directed,  apply ice to the head and neck area:  Put ice in a plastic bag.  Place a towel between your skin and the bag.  Leave the ice on for 20 minutes, 2-3 times per day.  Use a heating pad or a hot shower to apply heat to the head and neck area as told by your health care provider. Eating and Drinking  Eat meals on a regular schedule.  Limit alcohol use.  Decrease your caffeine intake, or stop using caffeine. General Instructions  Keep all follow-up visits as told by your health care provider. This is important.  Keep a headache journal to help find out what may trigger your headaches. For example, write down:  What you eat and drink.  How much sleep you get.  Any change to your diet or medicines.  Try massage or other relaxation techniques.  Limit stress.  Sit up straight, and avoid tensing your muscles.  Do not use tobacco products, including cigarettes, chewing tobacco, or e-cigarettes. If you need help quitting, ask your health care provider.  Exercise regularly as told by your health care provider.  Get 7-9 hours of sleep, or the amount recommended by your health care provider. SEEK MEDICAL CARE IF:  Your symptoms are not helped by medicine.  You have a headache that is different from what you normally experience.  You have nausea or you vomit.  You have a fever. SEEK IMMEDIATE MEDICAL CARE IF:  Your headache becomes severe.  You have repeated vomiting.  You have a stiff neck.  You have a loss of vision.  You have problems with speech.  You have pain in your eye or ear.  You have muscular weakness or loss of muscle control.  You lose your balance or you have trouble walking.  You feel faint or you pass out.  You have confusion.   This information is not intended to replace advice given to you by your health care provider. Make sure you discuss any questions you have with your health care provider.   Document Released: 10/13/2005 Document Revised:  07/04/2015 Document Reviewed: 02/05/2015 Elsevier Interactive Patient Education [employer]

## 2016-03-04 NOTE — ED Provider Notes (Signed)
CSN: [id number]     Arrival date & time 03/03/16  1830 History   First MD Initiated Contact with Patient 03/04/16 0217     Chief Complaint  Patient presents with  . Chest Pain  . Headache     (Consider location/radiation/quality/duration/timing/severity/associated sxs/prior Treatment)  HPI Comments: 30 year old female presents to the emergency department for multiple complaints. She states that she has had a frontal and temporal headache for the past 3 days. Patient states that the headache is fairly constant and pressure-like/throbbing in nature. She has had no associated fevers, vision loss, hearing loss, vomiting, or head trauma. She denies taking any medications for her symptoms. Patient notes no aggravating or alleviating factors of her headache.  Patient also complains of central and left-sided chest pain. She states that it feels as though "someone is sitting on my chest". She currently describes the pain as burning. Chest pain has been intermittent over the past 3 days, though it was fairly constant yesterday. Patient has had no diaphoresis or syncope. She has felt fatigued with worsening pain. No extremity numbness or weakness. No history of sudden cardiac death at a young age in her family. No prior history of ACS, diabetes, hypertension, or dyslipidemia in patient. She denies the use of birth control as well as surgeries or hospitalizations recently. No history of DVT/PE. She denies leg swelling.  Patient is a 30 y.o. female presenting with chest pain and headaches. The history is provided by the patient. No language interpreter was used.  Chest Pain Associated symptoms: fatigue, headache and nausea   Associated symptoms: no fever and not vomiting   Headache Associated symptoms: fatigue and nausea   Associated symptoms: no fever and no vomiting     Past Medical History  Diagnosis Date  . Asthma   . ADHD (attention deficit hyperactivity disorder)    No past surgical history on  file. No family history on file. Social History  Substance Use Topics  . Smoking status: Current Some Day Smoker -- 1.50 packs/day for 10 years    Types: Cigarettes  . Smokeless tobacco: Not on file  . Alcohol Use: No   OB History    No data available      Review of Systems  Constitutional: Positive for fatigue. Negative for fever.  Cardiovascular: Positive for chest pain.  Gastrointestinal: Positive for nausea. Negative for vomiting.  Neurological: Positive for headaches. Negative for syncope.  All other systems reviewed and are negative. Ten systems reviewed and are negative for acute change, except as noted in the HPI.    Allergies  Review of patient's allergies indicates no known allergies.  Home Medications   Prior to Admission medications   Medication Sig Start Date End Date Taking? Authorizing Provider  HYDROcodone-acetaminophen (NORCO) 5-325 MG tablet Take 1 tablet by mouth every 6 (six) hours as needed. Patient taking differently: Take 1 tablet by mouth every 6 (six) hours as needed for moderate pain or severe pain.  01/28/16   Wallis BambergMario Mani, PA-C  naproxen (NAPROSYN) 500 MG tablet Take 1 tablet (500 mg total) by mouth 2 (two) times daily. 03/04/16   Antony MaduraKelly Azavion Bouillon, PA-C  omeprazole (PRILOSEC) 20 MG capsule Take 1 capsule (20 mg total) by mouth daily. 03/04/16   Antony MaduraKelly Cross Jorge, PA-C  tamsulosin (FLOMAX) 0.4 MG CAPS capsule Take 1 capsule (0.4 mg total) by mouth daily. 01/28/16   Wallis BambergMario Mani, PA-C   BP 123/88 mmHg  Pulse 57  Temp(Src) 98.1 F (36.7 C) (Oral)  Resp 20  SpO2 98%  LMP 02/20/2016   Physical Exam  Constitutional: She is oriented to person, place, and time. She appears well-developed and well-nourished. No distress.  Nontoxic appearing and in no distress. No visible or audible signs of discomfort.  HENT:  Head: Normocephalic and atraumatic.  Mouth/Throat: Oropharynx is clear and moist. No oropharyngeal exudate.  Eyes: Conjunctivae and EOM are normal. Pupils are  equal, round, and reactive to light. No scleral icterus.  Neck: Normal range of motion.  No nuchal rigidity or meningismus  Cardiovascular: Normal rate, regular rhythm and intact distal pulses.   Pulmonary/Chest: Effort normal and breath sounds normal. No respiratory distress. She has no wheezes. She has no rales. She exhibits tenderness.  Respirations even and unlabored. Lungs clear to auscultation bilaterally. Mild TTP to left anterior chest wall. No crepitus or deformity.  Musculoskeletal: Normal range of motion.  Neurological: She is alert and oriented to person, place, and time. No cranial nerve deficit. She exhibits normal muscle tone. Coordination normal.  GCS 15. Speech is goal oriented. No focal neurologic deficits appreciated. Patient moves extremities without ataxia. She has been ambulatory in the ED with steady gait.  Skin: Skin is warm and dry. No rash noted. She is not diaphoretic. No erythema. No pallor.  Psychiatric: She has a normal mood and affect. Her behavior is normal.  Nursing note and vitals reviewed.   ED Course  Procedures (including critical care time) Labs Review Labs Reviewed  CBC - Abnormal; Notable for the following:    RBC 5.25 (*)    Hemoglobin 11.8 (*)    MCV 68.6 (*)    MCH 22.5 (*)    RDW 16.8 (*)    All other components within normal limits  BASIC METABOLIC PANEL  I-STAT TROPOININ, ED    Imaging Review Dg Chest 2 View  03/03/2016  CLINICAL DATA:  Chest pain and dyspnea for 4 days.  Smoker.  Asthma. EXAM: CHEST  2 VIEW COMPARISON:  08/17/2015 FINDINGS: The heart size and mediastinal contours are within normal limits. Both lungs are clear. The visualized skeletal structures are unremarkable. IMPRESSION: Negative.  No active cardiopulmonary disease. Electronically Signed   By: Myles Rosenthal M.D.   On: 03/03/2016 19:29   I have personally reviewed and evaluated these images and lab results as part of my medical decision-making.   EKG  Interpretation   Date/Time:  Monday Mar 03 2016 19:04:47 EDT Ventricular Rate:  80 PR Interval:  167 QRS Duration: 84 QT Interval:  351 QTC Calculation: 405 R Axis:   38 Text Interpretation:  Sinus rhythm Low voltage, precordial leads RSR' in  V1 or V2, probably normal variant since last tracing no significant change  Confirmed by Effie Shy  MD, ELLIOTT [phone]) on 03/03/2016 7:14:12 PM      MDM   Final diagnoses:  Chest pain, unspecified chest pain type  Tension headache    30 year old female presents to the ED for evaluation of chest pain and headache. Symptoms began 3 days ago. Patient is well and nontoxic appearing. She is in no visible or audible signs of discomfort. With regard to the patient's chest pain, she has a reassuring cardiac workup today. No significant change noted on EKG. No evidence of STEMI. Troponin is negative. Patient is overall at low risk for ACS; heart score is 1 for smoking history. Also doubt pulmonary embolus. Patient is PERC negative. Chest x-ray without evidence of acute cardiopulmonary abnormalities. No mediastinal widening to suggest dissection. Pain is slightly reproducible on palpation;  suspect musculoskeletal etiology or other nonemergent cause of pain.  Patient also complains of a headache. Suspect tension type headache. Patient has no fever, nuchal rigidity, or meningismus to suggest meningitis. She also has no leukocytosis today. Neurologic exam is nonfocal. She reports improvement in her pain with Percocet given in triage. Doubt emergent intracranial process.  I do not believe the patient warrants further emergent workup or imaging at this time. Will continue with supportive care on an outpatient basis. Primary care follow-up advised for persistent symptoms. Return precautions given. Patient discharged in satisfactory condition with no unaddressed concerns.   Filed Vitals:   03/03/16 2322 03/04/16 0230 03/04/16 0300 03/04/16 0303  BP: 123/89 134/84  123/88   Pulse: 74 67 57   Temp: 98.4 F (36.9 C)   98.1 F (36.7 C)  TempSrc: Oral   Oral  Resp: 18 18 20    SpO2: 98% 99% 98%      Antony Madura, PA-C 03/04/16 1610  Laurence Spates, MD 03/06/16 0001

## 2016-06-17 ENCOUNTER — Emergency Department (HOSPITAL_COMMUNITY)
Admission: EM | Admit: 2016-06-17 | Discharge: 2016-06-17 | Disposition: A | Discharge status: Home / Self Care | Payer: PRIVATE HEALTH INSURANCE | Attending: Emergency Medicine | Admitting: Emergency Medicine

## 2016-06-17 ENCOUNTER — Encounter (HOSPITAL_COMMUNITY): Payer: Self-pay

## 2016-06-17 DIAGNOSIS — F17210 Nicotine dependence, cigarettes, uncomplicated: Secondary | ICD-10-CM | POA: Insufficient documentation

## 2016-06-17 DIAGNOSIS — M79601 Pain in right arm: Secondary | ICD-10-CM | POA: Insufficient documentation

## 2016-06-17 DIAGNOSIS — F909 Attention-deficit hyperactivity disorder, unspecified type: Secondary | ICD-10-CM | POA: Insufficient documentation

## 2016-06-17 DIAGNOSIS — J45909 Unspecified asthma, uncomplicated: Secondary | ICD-10-CM | POA: Insufficient documentation

## 2016-06-17 MED ORDER — TRAMADOL HCL 50 MG PO TABS: 50.0000 mg | ORAL_TABLET | Freq: Four times a day (QID) | ORAL | 0 refills | Status: DC | PRN

## 2016-06-17 MED ORDER — TRAMADOL HCL 50 MG PO TABS
50.0000 mg | ORAL_TABLET | Freq: Once | ORAL | Status: AC
  Administered 2016-06-17: 50 mg via ORAL
  Filled 2016-06-17: qty 1

## 2016-06-17 NOTE — ED Provider Notes (Signed)
[facility]-EMERGENCY DEPT Provider Note   CSN: [id number] Arrival date & time: 06/17/16  0745     History   Chief Complaint Chief Complaint  Patient presents with  . Arm Pain    HPI Amy Sexton is a 30 y.o. female.  Patient presents today with a chief complaint of right arm pain.  She reports that the pain woke her up from sleep around 3 AM this morning and has been constant since that time.  She denies acute injury or trauma to the arm.  She states that her whole arm hurts, but the pain is worse in the elbow.  She describes the pain as a sharp, burning pain.  Pain is worse with extension of the elbow.  She has not taken anything for pain prior to arrival.  She is unsure if she fell asleep on her elbow.  She denies drinking any alcohol, using recreational drugs, or using any sleep medications last evening.  She does report associated numbness of all of her fingers, which is worse in the 4th and 5th fingers.  She denies any neck pain.        Past Medical History:  Diagnosis Date  . ADHD (attention deficit hyperactivity disorder)   . Asthma     There are no active problems to display for this patient.   History reviewed. No pertinent surgical history.  OB History    No data available       Home Medications    Prior to Admission medications   Medication Sig Start Date End Date Taking? Authorizing Provider  HYDROcodone-acetaminophen (NORCO) 5-325 MG tablet Take 1 tablet by mouth every 6 (six) hours as needed. Patient not taking: Reported on 06/17/2016 01/28/16   Wallis BambergMario Mani, PA-C  naproxen (NAPROSYN) 500 MG tablet Take 1 tablet (500 mg total) by mouth 2 (two) times daily. Patient not taking: Reported on 06/17/2016 03/04/16   Antony MaduraKelly Humes, PA-C  omeprazole (PRILOSEC) 20 MG capsule Take 1 capsule (20 mg total) by mouth daily. Patient not taking: Reported on 06/17/2016 03/04/16   Antony MaduraKelly Humes, PA-C  tamsulosin (FLOMAX) 0.4 MG CAPS capsule Take 1 capsule (0.4 mg total) by mouth  daily. Patient not taking: Reported on 06/17/2016 01/28/16   Wallis BambergMario Mani, PA-C    Family History History reviewed. No pertinent family history.  Social History Social History  Substance Use Topics  . Smoking status: Current Some Day Smoker    Packs/day: 1.00    Years: 10.00    Types: Cigarettes  . Smokeless tobacco: Never Used  . Alcohol use No     Allergies   Review of patient's allergies indicates no known allergies.   Review of Systems Review of Systems  All other systems reviewed and are negative.    Physical Exam Updated Vital Signs BP (!) 137/104 (BP Location: Left Arm)   Pulse 87   Temp 98.6 F (37 C) (Oral)   Resp 20   Ht 5\' 2"  (1.575 m)   Wt 95.3 kg   LMP 05/20/2016   SpO2 96%   BMI 38.41 kg/m   Physical Exam  Constitutional: She appears well-developed and well-nourished.  HENT:  Head: Normocephalic and atraumatic.  Neck: Normal range of motion. Neck supple.  Cardiovascular: Normal rate, regular rhythm and normal heart sounds.   Pulses:      Radial pulses are 2+ on the right side, and 2+ on the left side.  Pulmonary/Chest: Effort normal and breath sounds normal.  Musculoskeletal: Normal range of motion.  Full  ROM of the right wrist, no wrist drop Full ROM of the right elbow, but increased pain with extension of the elbow No tenderness to palpation of the cervical spine  Neurological: She is alert.  Reflex Scores:      Brachioradialis reflexes are 2+ on the right side. Grip strength 5/5 bilaterally Distal sensation of all fingers intact  Skin: Skin is warm and dry. No erythema.  No erythema, edema, or warmth of the right arm  Nursing note and vitals reviewed.    ED Treatments / Results  Labs (all labs ordered are listed, but only abnormal results are displayed) Labs Reviewed - No data to display  EKG  EKG Interpretation None       Radiology No results found.  Procedures Procedures (including critical care time)  Medications  Ordered in ED Medications  traMADol (ULTRAM) tablet 50 mg (not administered)     Initial Impression / Assessment and Plan / ED Course  I have reviewed the triage vital signs and the nursing notes.  Pertinent labs & imaging results that were available during my care of the patient were reviewed by me and considered in my medical decision making (see chart for details).  Clinical Course    Final Clinical Impressions(s) / ED Diagnoses   Final diagnoses:  None   Patient presents today with right arm pain.  No injury or trauma.  No signs of infection.  No signs of DVT.  She noticed the pain when she woke up in the morning and describes it as a burning pain.  She denies any pain in her neck.  Symptoms seem similar to Ulnar Neuropathy, however, she states that she feels the numbness of all fingers.  She does say that the numbness of the 4th and 5th finger are worse.  She is neurovascularly intact.  Patient given sling.  Stable for discharge.  Return precautions given.  New Prescriptions New Prescriptions   No medications on file     Santiago GladHeather Zeda Gangwer, PA-C 06/20/16 1901    Benjiman CoreNathan Pickering, MD 06/21/16 [phone]

## 2016-06-17 NOTE — ED Triage Notes (Addendum)
Pt c/o R arm and hand pain starting around 0300.  Pain score 10/10.  Denies injury.  Sts she woke up w/ pain .  Pt has not taken anything for pain.  Decreases ROM noted.  Pt able to flex elbow, but unable to extend.  Pt able to wiggle fingers.  Pt reports numbness and tingling in hand.

## 2016-10-10 ENCOUNTER — Encounter (HOSPITAL_COMMUNITY): Payer: Self-pay | Admitting: Emergency Medicine

## 2016-10-10 ENCOUNTER — Emergency Department (HOSPITAL_COMMUNITY)
Admission: EM | Admit: 2016-10-10 | Discharge: 2016-10-10 | Disposition: A | Discharge status: Home / Self Care | Payer: PRIVATE HEALTH INSURANCE | Attending: Emergency Medicine | Admitting: Emergency Medicine

## 2016-10-10 DIAGNOSIS — J45909 Unspecified asthma, uncomplicated: Secondary | ICD-10-CM | POA: Insufficient documentation

## 2016-10-10 DIAGNOSIS — F909 Attention-deficit hyperactivity disorder, unspecified type: Secondary | ICD-10-CM | POA: Insufficient documentation

## 2016-10-10 DIAGNOSIS — M5412 Radiculopathy, cervical region: Secondary | ICD-10-CM | POA: Insufficient documentation

## 2016-10-10 DIAGNOSIS — F17210 Nicotine dependence, cigarettes, uncomplicated: Secondary | ICD-10-CM | POA: Insufficient documentation

## 2016-10-10 MED ORDER — KETOROLAC TROMETHAMINE 30 MG/ML IJ SOLN
30.0000 mg | Freq: Once | INTRAMUSCULAR | Status: AC
  Administered 2016-10-10: 30 mg via INTRAMUSCULAR
  Filled 2016-10-10: qty 1

## 2016-10-10 MED ORDER — DEXAMETHASONE SODIUM PHOSPHATE 10 MG/ML IJ SOLN
10.0000 mg | Freq: Once | INTRAMUSCULAR | Status: AC
  Administered 2016-10-10: 10 mg via INTRAMUSCULAR
  Filled 2016-10-10: qty 1

## 2016-10-10 MED ORDER — PREDNISONE 10 MG (21) PO TBPK: ORAL_TABLET | ORAL | 0 refills | Status: DC

## 2016-10-10 MED ORDER — HYDROCODONE-ACETAMINOPHEN 5-325 MG PO TABS: 1.0000 | ORAL_TABLET | ORAL | 0 refills | Status: DC | PRN

## 2016-10-10 NOTE — ED Notes (Signed)
MD At bedside

## 2016-10-10 NOTE — ED Triage Notes (Signed)
Pt from home with complaints of left shoulder pain and neck pain. Pt states shoulder pain is chronic and secondary to a pinched neve. Pt states neck pain began yesterday. There was no trauma. Pt has full range of motion in her neck with no nuchal rigidity. Pt states she had a temperature of 104 at 10am. Pt states she tool aleve around that time. Pt is not febrile at time of assessment. Pt also reports on and off nausea. Pt states she had one episode of emesis this morning, but not since then. Pt denies diarrhea.

## 2016-10-10 NOTE — ED Provider Notes (Signed)
[facility]-EMERGENCY DEPT Provider Note   CSN: [id number] Arrival date & time: 10/10/16  2112     History   Chief Complaint Chief Complaint  Patient presents with  . Neck Pain    HPI Amy Sexton is a 30 y.o. female.  Pt presents to the ED today with left shoulder and neck pain.  Pt said that she gets this pain periodically from a pinched nerve, but this is worse than normal.  The pt said she had a hard time sleeping last night due to the pain.  Pt said she had a temp of 104 today at 1000.  She took an aleve then, but nothing since then.       Past Medical History:  Diagnosis Date  . ADHD (attention deficit hyperactivity disorder)   . Asthma     There are no active problems to display for this patient.   History reviewed. No pertinent surgical history.  OB History    No data available       Home Medications    Prior to Admission medications   Medication Sig Start Date End Date Taking? Authorizing Provider  ibuprofen (ADVIL,MOTRIN) 200 MG tablet Take 400 mg by mouth every 6 (six) hours as needed for headache, mild pain or moderate pain.   Yes Historical Provider, MD  HYDROcodone-acetaminophen (NORCO/VICODIN) 5-325 MG tablet Take 1 tablet by mouth every 4 (four) hours as needed. 10/10/16   Jacalyn LefevreJulie Tamari Busic, MD  naproxen (NAPROSYN) 500 MG tablet Take 1 tablet (500 mg total) by mouth 2 (two) times daily. Patient not taking: Reported on 10/10/2016 03/04/16   Antony MaduraKelly Humes, PA-C  omeprazole (PRILOSEC) 20 MG capsule Take 1 capsule (20 mg total) by mouth daily. Patient not taking: Reported on 10/10/2016 03/04/16   Antony MaduraKelly Humes, PA-C  predniSONE (STERAPRED UNI-PAK 21 TAB) 10 MG (21) TBPK tablet Take 6 tabs by mouth daily  for 2 days, then 5 tabs for 2 days, then 4 tabs for 2 days, then 3 tabs for 2 days, 2 tabs for 2 days, then 1 tab by mouth daily for 2 days 10/10/16   Jacalyn LefevreJulie Danicka Hourihan, MD  traMADol (ULTRAM) 50 MG tablet Take 1 tablet (50 mg total) by mouth every 6 (six) hours as  needed. Patient not taking: Reported on 10/10/2016 06/17/16   Santiago GladHeather Laisure, PA-C    Family History No family history on file.  Social History Social History  Substance Use Topics  . Smoking status: Current Every Day Smoker    Packs/day: 0.50    Years: 10.00    Types: Cigarettes  . Smokeless tobacco: Never Used  . Alcohol use No     Allergies   Patient has no known allergies.   Review of Systems Review of Systems  Musculoskeletal:       Left neck tenderness  All other systems reviewed and are negative.    Physical Exam Updated Vital Signs BP 142/87 (BP Location: Left Arm)   Pulse 80   Temp 98.5 F (36.9 C) (Oral)   Resp 16   Ht 5\' 2"  (1.575 m)   Wt 210 lb (95.3 kg)   LMP 09/28/2016   SpO2 100%   BMI 38.41 kg/m   Physical Exam  Constitutional: She appears well-developed and well-nourished.  HENT:  Head: Normocephalic and atraumatic.  Right Ear: External ear normal.  Left Ear: External ear normal.  Nose: Nose normal.  Mouth/Throat: Oropharynx is clear and moist.  Eyes: Conjunctivae and EOM are normal. Pupils are equal, round, and reactive  to light.  Neck: Normal range of motion. Neck supple.    Cardiovascular: Normal rate, regular rhythm, normal heart sounds and intact distal pulses.   Pulmonary/Chest: Effort normal and breath sounds normal.  Abdominal: Soft. Bowel sounds are normal.  Musculoskeletal: Normal range of motion.  Neurological: She is alert.  Skin: Skin is warm.  Psychiatric: She has a normal mood and affect. Her behavior is normal.  Nursing note and vitals reviewed.    ED Treatments / Results  Labs (all labs ordered are listed, but only abnormal results are displayed) Labs Reviewed - No data to display  EKG  EKG Interpretation None       Radiology No results found.  Procedures Procedures (including critical care time)  Medications Ordered in ED Medications  dexamethasone (DECADRON) injection 10 mg (10 mg Intramuscular  Given 10/10/16 2227)  ketorolac (TORADOL) 30 MG/ML injection 30 mg (30 mg Intramuscular Given 10/10/16 2230)     Initial Impression / Assessment and Plan / ED Course  I have reviewed the triage vital signs and the nursing notes.  Pertinent labs & imaging results that were available during my care of the patient were reviewed by me and considered in my medical decision making (see chart for details).  Clinical Course    Pt is feeling much better now.  No fever or signs of meningitis.  Pt knows to return if worse.  Final Clinical Impressions(s) / ED Diagnoses   Final diagnoses:  Cervical radiculopathy    New Prescriptions New Prescriptions   HYDROCODONE-ACETAMINOPHEN (NORCO/VICODIN) 5-325 MG TABLET    Take 1 tablet by mouth every 4 (four) hours as needed.   PREDNISONE (STERAPRED UNI-PAK 21 TAB) 10 MG (21) TBPK TABLET    Take 6 tabs by mouth daily  for 2 days, then 5 tabs for 2 days, then 4 tabs for 2 days, then 3 tabs for 2 days, 2 tabs for 2 days, then 1 tab by mouth daily for 2 days     Jacalyn LefevreJulie Jacquita Mulhearn, MD 10/10/16 2306

## 2017-02-03 DIAGNOSIS — Z113 Encounter for screening for infections with a predominantly sexual mode of transmission: Secondary | ICD-10-CM | POA: Diagnosis not present

## 2017-02-03 DIAGNOSIS — Z01419 Encounter for gynecological examination (general) (routine) without abnormal findings: Secondary | ICD-10-CM | POA: Diagnosis not present

## 2017-02-03 DIAGNOSIS — R875 Abnormal microbiological findings in specimens from female genital organs: Secondary | ICD-10-CM | POA: Diagnosis not present

## 2017-02-03 DIAGNOSIS — Z7251 High risk heterosexual behavior: Secondary | ICD-10-CM | POA: Diagnosis not present

## 2017-02-03 DIAGNOSIS — Z1151 Encounter for screening for human papillomavirus (HPV): Secondary | ICD-10-CM | POA: Diagnosis not present

## 2017-02-03 DIAGNOSIS — Z6838 Body mass index (BMI) 38.0-38.9, adult: Secondary | ICD-10-CM | POA: Diagnosis not present

## 2017-02-03 DIAGNOSIS — Z1159 Encounter for screening for other viral diseases: Secondary | ICD-10-CM | POA: Diagnosis not present

## 2017-02-03 DIAGNOSIS — Z114 Encounter for screening for human immunodeficiency virus [HIV]: Secondary | ICD-10-CM | POA: Diagnosis not present

## 2017-02-14 IMAGING — CT CT RENAL STONE PROTOCOL
2 of 3 series · 16 of 46 positions shown, 18 images · non-contrast
Comparison: None available.

CLINICAL DATA: Left flank pain with nausea and vomiting, symptoms
for 6 days. Patient reports documented 13 mm renal calculus.

EXAM:
CT ABDOMEN AND PELVIS WITHOUT CONTRAST
TECHNIQUE: Multidetector CT imaging of the abdomen and pelvis was performed
following the standard protocol without IV contrast.

[Series 4: lung · axial · 0.86mm/px · z∈[-310,-206]mm · 13 of 41 slices shown, 15 images]
[im 3/41  soft-tissue]
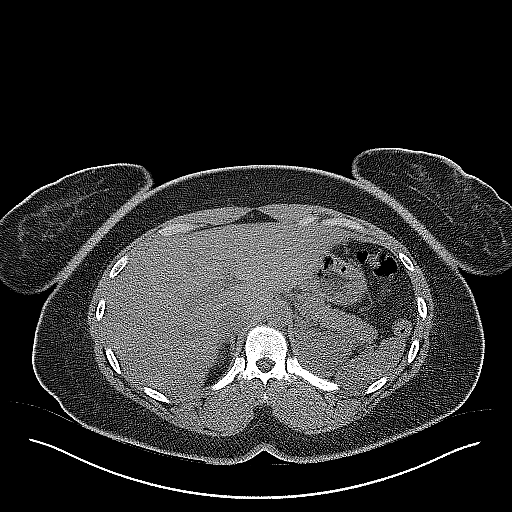
[im 3/41  bone]
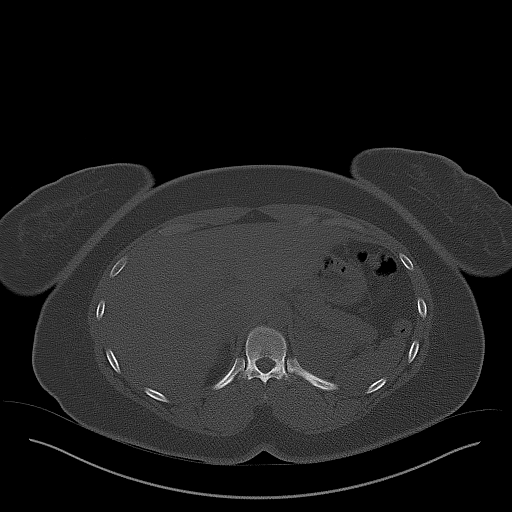
[im 6/41  soft-tissue]
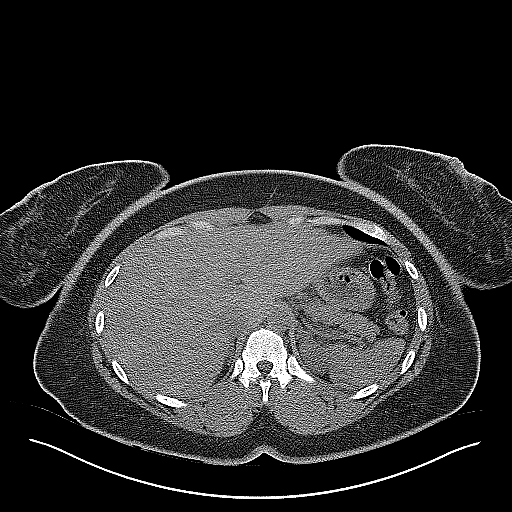
[im 8/41  soft-tissue]
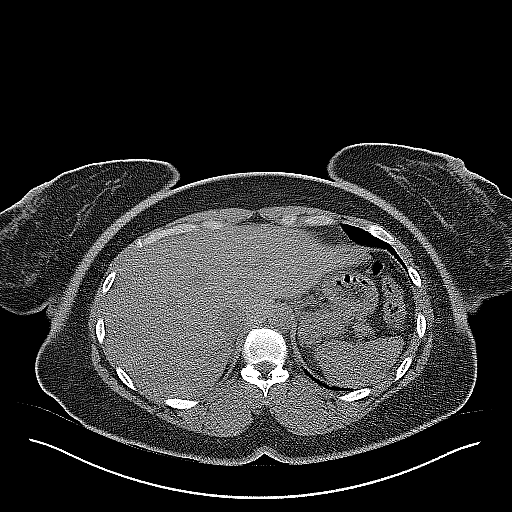
[im 12/41  soft-tissue]
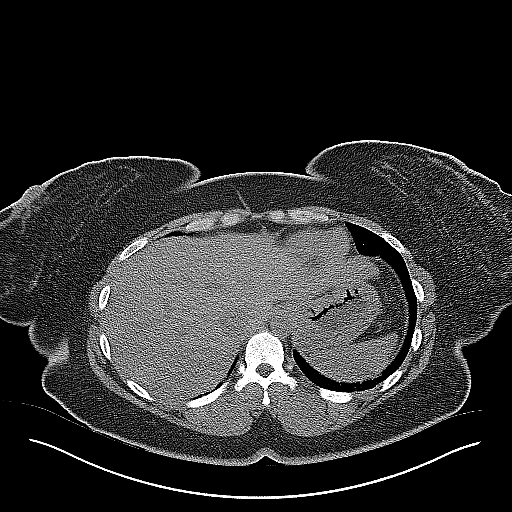
[im 15/41  soft-tissue]
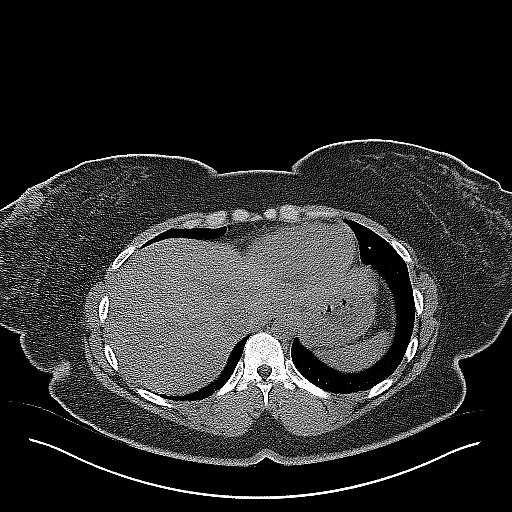
[im 17/41  soft-tissue]
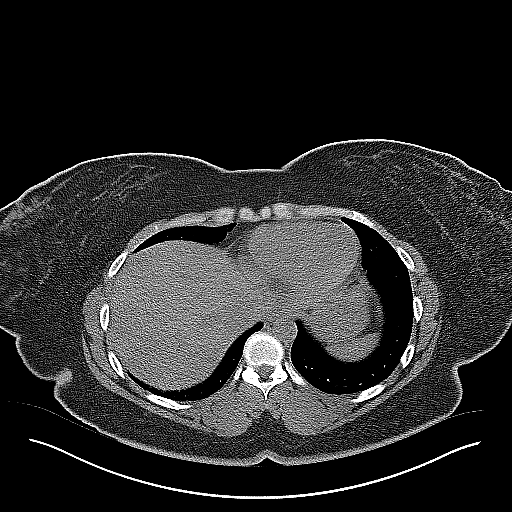
[im 21/41  soft-tissue]
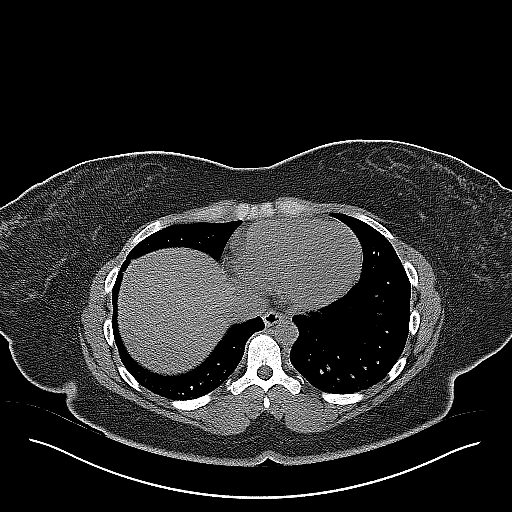
[im 24/41  soft-tissue]
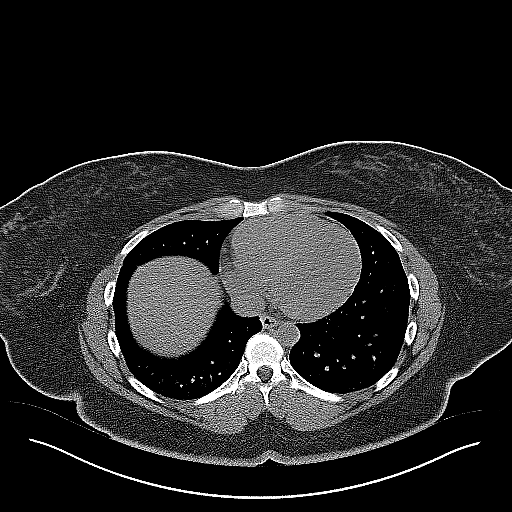
[im 26/41  soft-tissue]
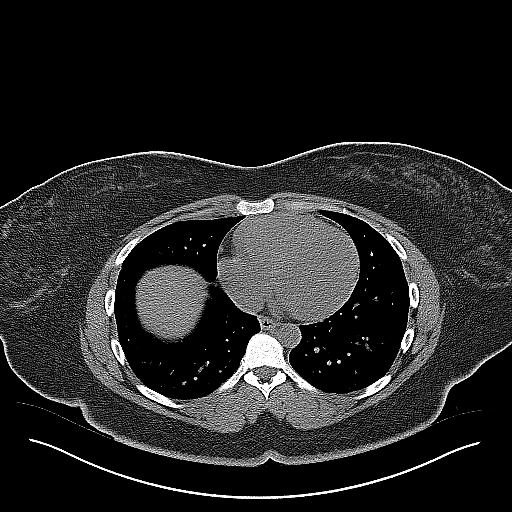
[im 26/41  bone]
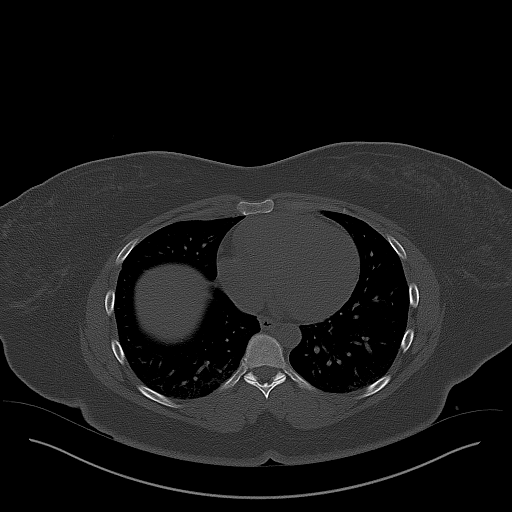
[im 29/41  soft-tissue]
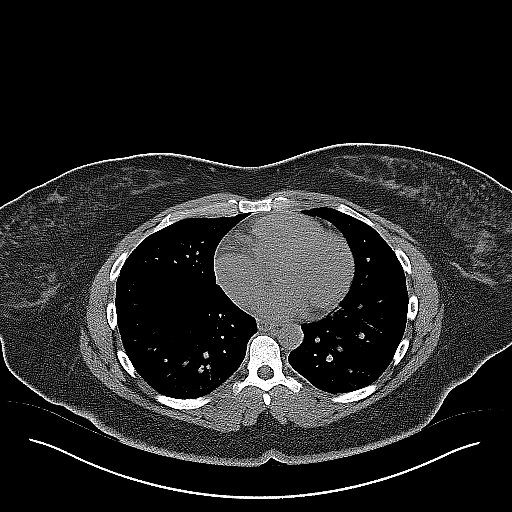
[im 33/41  soft-tissue]
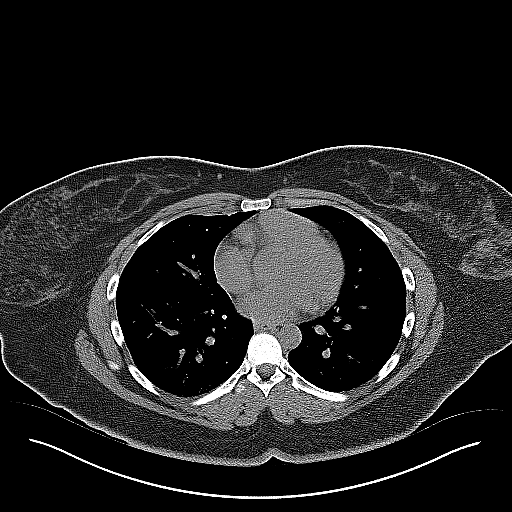
[im 35/41  soft-tissue]
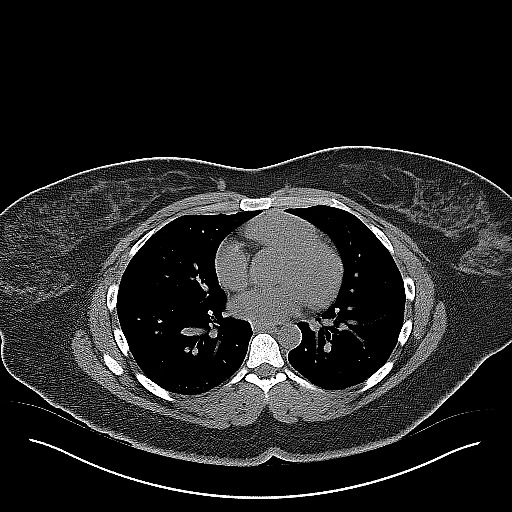
[im 38/41  soft-tissue]
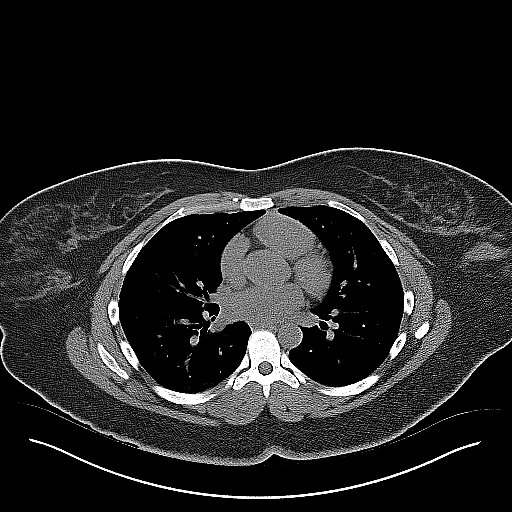

[Series 5: coronal · coronal · 0.80mm/px · 3 of 135 slices shown]
[im 45/135  soft-tissue]
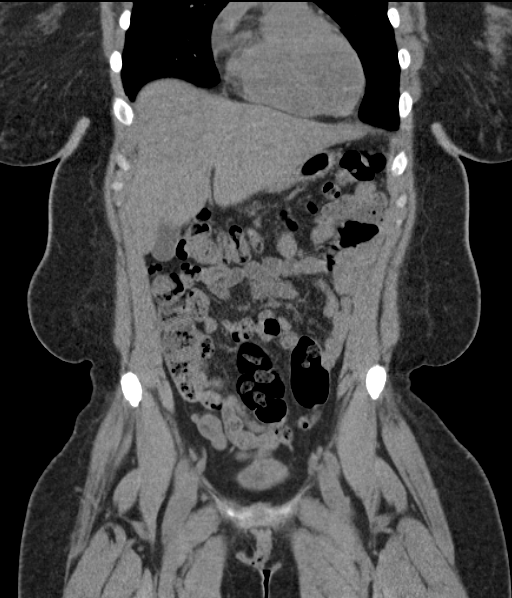
[im 60/135  soft-tissue]
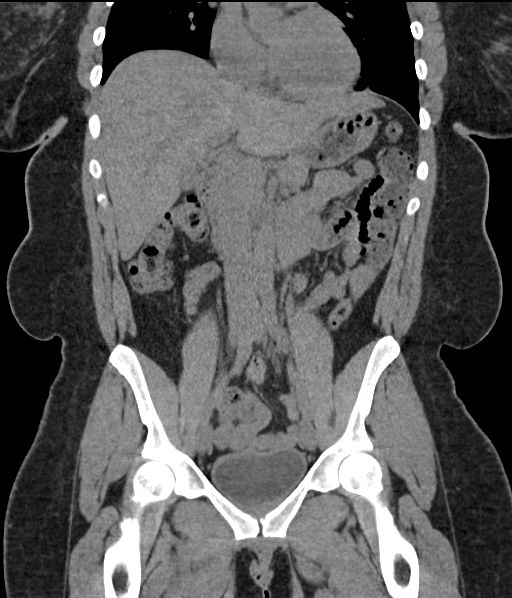
[im 75/135  soft-tissue]
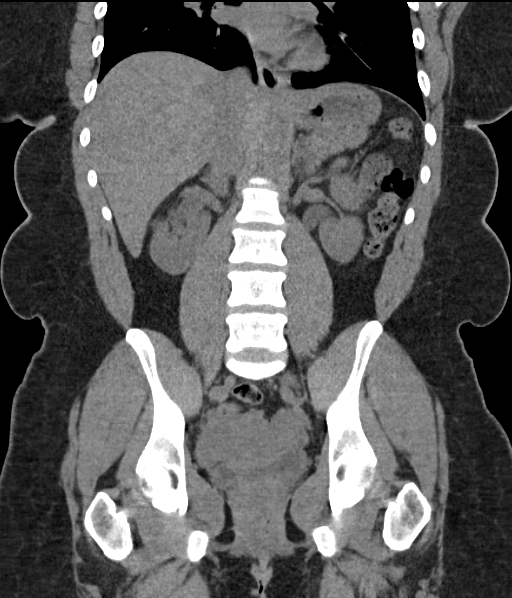

[16 of 46 positions shown; findings below may reference images not displayed]

FINDINGS: Lower chest:  The included lung bases are clear.

Liver: Normal noncontrast appearance.

Hepatobiliary: Gallbladder physiologically distended, no calcified
stone. No biliary dilatation.

Pancreas: No ductal dilatation or inflammation.

Spleen: Normal.

Adrenal glands: No nodule.

Kidneys: Mild prominence of the left renal pelvis without frank
hydronephrosis. Ureter is decompressed. No urolithiasis. There is
also mild prominence of the right renal pelvis, right kidney appears
partially malrotated, renal pelvis directed anteriorly. No
perinephric stranding. No urolithiasis. No evidence of stone in the
kidneys, over the course of the ureters or bladder.

Stomach/Bowel: Stomach physiologically distended. There are no
dilated or thickened small bowel loops. Small to moderate volume of
stool throughout the colon without colonic wall thickening. Mild
colonic tortuosity. The appendix is normal.

Vascular/Lymphatic: No retroperitoneal adenopathy. Abdominal aorta
is normal in caliber.

Reproductive: Uterus and adnexa normal for noncontrast technique.

Bladder: Minimally distended, no bladder stone.

Other: No free air, free fluid, or intra-abdominal fluid collection.
Small fat containing umbilical hernia.

Musculoskeletal: There are no acute or suspicious osseous
abnormalities.
IMPRESSION: 1. No urolithiasis. Mild prominence of the left renal pelvis, which
may reflect sequela of recently passed stone given patient's
reported history of documented renal calculus.
2. There is otherwise no acute abnormality in the abdomen/pelvis.
Tiny fat containing umbilical hernia, incidentally noted.

## 2017-03-09 ENCOUNTER — Ambulatory Visit (INDEPENDENT_AMBULATORY_CARE_PROVIDER_SITE_OTHER): Payer: BLUE CROSS/BLUE SHIELD

## 2017-03-09 ENCOUNTER — Encounter: Payer: Self-pay | Admitting: Urgent Care

## 2017-03-09 ENCOUNTER — Ambulatory Visit (INDEPENDENT_AMBULATORY_CARE_PROVIDER_SITE_OTHER): Payer: BLUE CROSS/BLUE SHIELD | Admitting: Urgent Care

## 2017-03-09 VITALS — BP 109/72 | HR 82 | Temp 98.7°F | Resp 16 | Ht 62.0 in | Wt 220.0 lb

## 2017-03-09 DIAGNOSIS — M542 Cervicalgia: Secondary | ICD-10-CM | POA: Diagnosis not present

## 2017-03-09 DIAGNOSIS — M25512 Pain in left shoulder: Secondary | ICD-10-CM

## 2017-03-09 DIAGNOSIS — G8929 Other chronic pain: Secondary | ICD-10-CM

## 2017-03-09 MED ORDER — CYCLOBENZAPRINE HCL 5 MG PO TABS: 5.0000 mg | ORAL_TABLET | Freq: Three times a day (TID) | ORAL | 1 refills | Status: DC | PRN

## 2017-03-09 MED ORDER — NAPROXEN SODIUM 550 MG PO TABS: 550.0000 mg | ORAL_TABLET | Freq: Two times a day (BID) | ORAL | 1 refills | Status: DC

## 2017-03-09 NOTE — Progress Notes (Signed)
  MRN: [id number] DOB: [birth year]  Subjective:   Amy Sexton is a 31 y.o. female presenting for chief complaint of Shoulder Pain  Reports >6 month history of left shoulder pain. Pain is mostly achy but has intermittent sharp pain, is intermittent. She still has intermittent neck pain that can go into her left arm. Has some associated swelling of her left arm, warmth as well. Has tried otc NSAIDs with minimal relief. She was previously seen in the ER twice in 05/2016 and 09/2016. She was referred to an orthopedist, an MRI was not done due to cost burden. Denies trauma, fever, redness.   Amy Sexton is not currently taking any medications. Also has No Known Allergies. Amy Sexton  has a past medical history of ADHD (attention deficit hyperactivity disorder) and Asthma. Also denies past surgical history.  Objective:   Vitals: BP 109/72 (BP Location: Right Arm, Patient Position: Sitting, Cuff Size: Large)   Pulse 82   Temp 98.7 F (37.1 C) (Oral)   Resp 16   Ht 5\' 2"  (1.575 m)   Wt 220 lb (99.8 kg)   LMP 03/04/2017   SpO2 98%   BMI 40.24 kg/m   Physical Exam  Constitutional: She is oriented to person, place, and time. She appears well-developed and well-nourished.  Cardiovascular: Normal rate.   Pulmonary/Chest: Effort normal.  Musculoskeletal:       Left shoulder: She exhibits decreased range of motion (external rotation and abduction) and tenderness (over left trapezius). She exhibits no bony tenderness, no swelling, no effusion, no crepitus, no deformity, no spasm and normal strength.       Cervical back: She exhibits tenderness (over lower paraspinal muscles). She exhibits normal range of motion (Negative Spurling maneuver), no bony tenderness, no swelling, no edema, no deformity, no laceration and no spasm.  Mildly positive Hawkin and Neer tests.  Neurological: She is alert and oriented to person, place, and time.   Dg Cervical Spine Complete  Result Date: 03/09/2017 CLINICAL DATA:   Left shoulder pain and neck pain. EXAM: CERVICAL SPINE - COMPLETE 4+ VIEW COMPARISON:  None. FINDINGS: There is no evidence of cervical spine fracture or prevertebral soft tissue swelling. Loss of cervical lordosis without subluxation. No significant degenerative disc disease identified. No other significant bone abnormalities are identified. IMPRESSION: Loss of lordosis.  Otherwise negative cervical spine radiographs. Electronically Signed   By: Irish LackGlenn  Yamagata M.D.   On: 03/09/2017 17:11   Dg Shoulder Left  Result Date: 03/09/2017 CLINICAL DATA:  Chronic left shoulder pain. EXAM: LEFT SHOULDER - 2+ VIEW COMPARISON:  None. FINDINGS: There is no evidence of fracture or dislocation. There is no evidence of arthropathy or other focal bone abnormality. Soft tissues are unremarkable. IMPRESSION: Negative. Electronically Signed   By: Amie Portlandavid  Ormond M.D.   On: 03/09/2017 17:09   Assessment and Plan :   1. Chronic left shoulder pain 2. Neck pain - Physical exam findings and radiology report is reassuring. Counseled patient on shoulder rehab, neck stretches. Start Anaprox, Flexeril. RTC if no improvement. Patient is not interested in MRI due to cost. She would consider PT.   Wallis BambergMario Salman Wellen, PA-C Primary [facility] at [facility] [phone] 03/09/2017  4:41 PM

## 2017-03-09 NOTE — Patient Instructions (Addendum)
Shoulder Pain Many things can cause shoulder pain, including:  An injury to the area.  Overuse of the shoulder.  Arthritis. The source of the pain can be:  Inflammation.  An injury to the shoulder joint.  An injury to a tendon, ligament, or bone. Follow these instructions at home: Take these actions to help with your pain:  Squeeze a soft ball or a foam pad as much as possible. This helps to keep the shoulder from swelling. It also helps to strengthen the arm.  Take over-the-counter and prescription medicines only as told by your health care provider.  If directed, apply ice to the area:  Put ice in a plastic bag.  Place a towel between your skin and the bag.  Leave the ice on for 20 minutes, 2-3 times per day. Stop applying ice if it does not help with the pain.  If you were given a shoulder sling or immobilizer:  Wear it as told.  Remove it to shower or bathe.  Move your arm as little as possible, but keep your hand moving to prevent swelling. Contact a health care provider if:  Your pain gets worse.  Your pain is not relieved with medicines.  New pain develops in your arm, hand, or fingers. Get help right away if:  Your arm, hand, or fingers:  Tingle.  Become numb.  Become swollen.  Become painful.  Turn white or blue. This information is not intended to replace advice given to you by your health care provider. Make sure you discuss any questions you have with your health care provider. Document Released: 07/23/2005 Document Revised: 06/08/2016 Document Reviewed: 02/05/2015 Elsevier Interactive Patient Education  2017 Elsevier Inc[employer]     IF you received an x-ray today, you will receive an invoice from [facility]. Please contact [facility] at [phone] with questions or concerns regarding your invoice.   IF you received labwork today, you will receive an invoice from [facility]. Please contact [facility] at [phone] with  questions or concerns regarding your invoice.   Our billing staff will not be able to assist you with questions regarding bills from these companies.  You will be contacted with the lab results as soon as they are available. The fastest way to get your results is to activate your My Chart account. Instructions are located on the last page of this paperwork. If you have not heard from us regarding the results in 2 weeks, please contact this office.      

## 2017-03-10 LAB — BASIC METABOLIC PANEL
BUN / CREAT RATIO: 11 (ref 9–23)
BUN: 9 mg/dL (ref 6–20)
CO2: 23 mmol/L (ref 18–29)
CREATININE: 0.8 mg/dL (ref 0.57–1.00)
Calcium: 8.4 mg/dL — ABNORMAL LOW (ref 8.7–10.2)
Chloride: 104 mmol/L (ref 96–106)
GFR calc Af Amer: 114 mL/min/{1.73_m2} (ref >59)
GFR calc non Af Amer: 99 mL/min/{1.73_m2} (ref >59)
GLUCOSE: 82 mg/dL (ref 65–99)
Potassium: 3.9 mmol/L (ref 3.5–5.2)
SODIUM: 138 mmol/L (ref 134–144)

## 2017-03-10 LAB — SEDIMENTATION RATE: Sed Rate: 19 mm/hr (ref 0–32)

## 2017-03-26 ENCOUNTER — Ambulatory Visit: Payer: Self-pay | Admitting: Urgent Care

## 2017-03-27 ENCOUNTER — Encounter: Payer: Self-pay | Admitting: Urgent Care

## 2017-03-27 ENCOUNTER — Ambulatory Visit (INDEPENDENT_AMBULATORY_CARE_PROVIDER_SITE_OTHER): Payer: BLUE CROSS/BLUE SHIELD | Admitting: Urgent Care

## 2017-03-27 VITALS — BP 107/74 | HR 84 | Temp 99.2°F | Resp 18 | Ht 62.72 in | Wt 218.4 lb

## 2017-03-27 DIAGNOSIS — G8929 Other chronic pain: Secondary | ICD-10-CM

## 2017-03-27 DIAGNOSIS — M25512 Pain in left shoulder: Secondary | ICD-10-CM

## 2017-03-27 DIAGNOSIS — F329 Major depressive disorder, single episode, unspecified: Secondary | ICD-10-CM

## 2017-03-27 DIAGNOSIS — G4700 Insomnia, unspecified: Secondary | ICD-10-CM | POA: Diagnosis not present

## 2017-03-27 DIAGNOSIS — R4589 Other symptoms and signs involving emotional state: Secondary | ICD-10-CM

## 2017-03-27 DIAGNOSIS — M62838 Other muscle spasm: Secondary | ICD-10-CM | POA: Diagnosis not present

## 2017-03-27 NOTE — Patient Instructions (Addendum)
Please make sure you are hydrating very well with at least 2 liters (64 ounces or 1 gallon) of water daily.  Take 2 tablets of Flexeril (that's 10mg ) at night.   Take over-the-counter naproxen at a dose of 2 tablets which should be between 400-500mg  depending on what you find.   Look into getting a massage for your trapezius muscle spasms.  Depending on the progression of your pain, blood test results and progression of your mood, we will consider starting medical therapy in 2 weeks for depression.    IF you received an x-ray today, you will receive an invoice from [facility]. Please contact [facility] at [phone] with questions or concerns regarding your invoice.   IF you received labwork today, you will receive an invoice from [facility]. Please contact [facility] at [phone] with questions or concerns regarding your invoice.   Our billing staff will not be able to assist you with questions regarding bills from these companies.  You will be contacted with the lab results as soon as they are available. The fastest way to get your results is to activate your My Chart account. Instructions are located on the last page of this paperwork. If you have not heard from us regarding the results in 2 weeks, please contact this office.

## 2017-03-27 NOTE — Progress Notes (Signed)
  MRN: [id number] DOB: 1986/04/11  Subjective:   Amy Sexton is a 31 y.o. female presenting for chief complaint of Shoulder Pain (X 6 mths- left shoulder) and Depression  Last OV for same was 03/09/2017, cervical x-ray showed loss of lordosis, left shoulder x-ray was negative. She was started on conservative management with Anaprox, Flexeril. Today, reports she has had minimal improvement in her trapezius and shoulder discomfort. Pain is very sharp and lasts hours when it comes on. It is worse at night and interrupts her sleep heavily. She was not able to purchase Anaprox due to cost. Has used some ibuprofen. Has also used Flexeril with some relief. Admits that she has felt down and depressed intermittently for the last month and worse since her shoulder pain started. Admits decreased appetite, insomnia, feeling sluggish. Denies SI, HI. Denies inability to enjoy activities she normally enjoys. Cannot recall an inciting event. She has a Dance movement psychotherapistmentor that is working with her. They have not discussed her mental health.  Michael BostonChiquita has a current medication list which includes the following prescription(s): cyclobenzaprine and naproxen sodium. Also has No Known Allergies. Michael BostonChiquita  has a past medical history of ADHD (attention deficit hyperactivity disorder) and Asthma. Also denies past surgical history.  Objective:   Vitals: BP 107/74 (BP Location: Right Arm, Patient Position: Sitting, Cuff Size: Large)   Pulse 84   Temp 99.2 F (37.3 C) (Oral)   Resp 18   Ht 5' 2.72" (1.593 m)   Wt 218 lb 6.4 oz (99.1 kg)   LMP 03/04/2017   SpO2 97%   BMI 39.04 kg/m   Physical Exam  Constitutional: She is oriented to person, place, and time. She appears well-developed and well-nourished.  HENT:  Mouth/Throat: Oropharynx is clear and moist.  Eyes: No scleral icterus.  Neck: Normal range of motion. Neck supple. No thyromegaly present.  Cardiovascular: Normal rate, regular rhythm and intact distal pulses.  Exam  reveals no gallop and no friction rub.   No murmur heard. Pulmonary/Chest: Effort normal. No respiratory distress. She has no wheezes. She has no rales.  Musculoskeletal:       Left shoulder: She exhibits tenderness (posterior shoulder) and spasm (very tender over area depicted). She exhibits normal range of motion, no bony tenderness, no swelling, no effusion, no crepitus, no deformity and normal strength.       Cervical back: She exhibits normal range of motion, no tenderness, no bony tenderness, no swelling, no edema, no deformity and no laceration.       Arms: Neurological: She is alert and oriented to person, place, and time.  Skin: Skin is warm and dry.  Psychiatric:  Somewhat flat affect but cheerful at times.   Assessment and Plan :   1. Chronic left shoulder pain 2. Trapezius muscle spasm - Continue Flexeril and adequate hydration. Start otc naproxen, perform trapezius stretching and patient will look for massage therapy. Follow up in 2 weeks, consider PT, referral to ortho at that point.  3. Depressed mood 4. Insomnia, unspecified type - Labs pending, will consider starting medical therapy if we cannot help her with #1,2 and her labs are normal.  Wallis BambergMario Karmen Altamirano, PA-C Primary Care at [facility] [phone] 03/27/2017  4:40 PM

## 2017-03-28 LAB — CBC
Hematocrit: 39.8 % (ref 34.0–46.6)
Hemoglobin: 12.4 g/dL (ref 11.1–15.9)
MCH: 22 pg — ABNORMAL LOW (ref 26.6–33.0)
MCHC: 31.2 g/dL — ABNORMAL LOW (ref 31.5–35.7)
MCV: 71 fL — ABNORMAL LOW (ref 79–97)
PLATELETS: 215 10*3/uL (ref 150–379)
RBC: 5.63 x10E6/uL — ABNORMAL HIGH (ref 3.77–5.28)
RDW: 17.5 % — ABNORMAL HIGH (ref 12.3–15.4)
WBC: 8.4 10*3/uL (ref 3.4–10.8)

## 2017-03-28 LAB — SEDIMENTATION RATE: Sed Rate: 7 mm/hr (ref 0–32)

## 2017-03-28 LAB — CK: CK TOTAL: 221 U/L — AB (ref 24–173)

## 2017-03-28 LAB — TSH: TSH: 0.714 u[IU]/mL (ref 0.450–4.500)

## 2017-03-30 ENCOUNTER — Encounter: Payer: Self-pay | Admitting: Urgent Care

## 2017-03-31 ENCOUNTER — Encounter: Payer: Self-pay | Admitting: Urgent Care

## 2017-04-11 ENCOUNTER — Encounter (HOSPITAL_COMMUNITY): Payer: Self-pay | Admitting: Nurse Practitioner

## 2017-04-11 DIAGNOSIS — R05 Cough: Secondary | ICD-10-CM | POA: Insufficient documentation

## 2017-04-11 DIAGNOSIS — J45909 Unspecified asthma, uncomplicated: Secondary | ICD-10-CM | POA: Insufficient documentation

## 2017-04-11 DIAGNOSIS — F17210 Nicotine dependence, cigarettes, uncomplicated: Secondary | ICD-10-CM | POA: Insufficient documentation

## 2017-04-11 DIAGNOSIS — R0789 Other chest pain: Secondary | ICD-10-CM | POA: Insufficient documentation

## 2017-04-11 DIAGNOSIS — Z79899 Other long term (current) drug therapy: Secondary | ICD-10-CM | POA: Insufficient documentation

## 2017-04-11 NOTE — ED Triage Notes (Signed)
Patient states she began having some midsternum chest pain (aching and throbbing) around 5pm. She went over her friend house to relax and hang out afterwards. Pain went away but then came back so she went home and laid down. Pain comes and goes. Denies any nausea, diaphoresis, heavy lifting, pulling, or turning. Pain is reproducible when pressing on sternum. States she drove herself and was a little lightheaded when standing earlier but went away. Denies any arm, jaw, or other pains.

## 2017-04-12 ENCOUNTER — Emergency Department (HOSPITAL_COMMUNITY): Payer: PRIVATE HEALTH INSURANCE

## 2017-04-12 ENCOUNTER — Emergency Department (HOSPITAL_COMMUNITY)
Admission: EM | Admit: 2017-04-12 | Discharge: 2017-04-12 | Disposition: A | Discharge status: Home / Self Care | Payer: PRIVATE HEALTH INSURANCE | Attending: Emergency Medicine | Admitting: Emergency Medicine

## 2017-04-12 DIAGNOSIS — R079 Chest pain, unspecified: Secondary | ICD-10-CM | POA: Diagnosis not present

## 2017-04-12 DIAGNOSIS — R0789 Other chest pain: Secondary | ICD-10-CM

## 2017-04-12 LAB — BASIC METABOLIC PANEL
ANION GAP: 5 (ref 5–15)
BUN: 12 mg/dL (ref 6–20)
CHLORIDE: 112 mmol/L — AB (ref 101–111)
CO2: 22 mmol/L (ref 22–32)
Calcium: 8.5 mg/dL — ABNORMAL LOW (ref 8.9–10.3)
Creatinine, Ser: 0.81 mg/dL (ref 0.44–1.00)
GFR calc Af Amer: >60 mL/min (ref >60)
GFR calc non Af Amer: >60 mL/min (ref >60)
GLUCOSE: 103 mg/dL — AB (ref 65–99)
POTASSIUM: 3.9 mmol/L (ref 3.5–5.1)
Sodium: 139 mmol/L (ref 135–145)

## 2017-04-12 LAB — CBC
HEMATOCRIT: 36.4 % (ref 36.0–46.0)
Hemoglobin: 11.7 g/dL — ABNORMAL LOW (ref 12.0–15.0)
MCH: 22.1 pg — AB (ref 26.0–34.0)
MCHC: 32.1 g/dL (ref 30.0–36.0)
MCV: 68.7 fL — AB (ref 78.0–100.0)
Platelets: 184 10*3/uL (ref 150–400)
RBC: 5.3 MIL/uL — ABNORMAL HIGH (ref 3.87–5.11)
RDW: 16.4 % — ABNORMAL HIGH (ref 11.5–15.5)
WBC: 7.4 10*3/uL (ref 4.0–10.5)

## 2017-04-12 LAB — POCT I-STAT TROPONIN I: Troponin i, poc: 0 ng/mL (ref 0.00–0.08)

## 2017-04-12 NOTE — ED Provider Notes (Signed)
[facility]-EMERGENCY DEPT Provider Note   CSN: [id number] Arrival date & time: 04/11/17  2345  By signing my name below, I, Phillips ClimesFabiola de Louis, attest that this documentation has been prepared under the direction and in the presence of Melene PlanFloyd, Thaxton Pelley, DO . Electronically Signed: Phillips ClimesFabiola de Louis, Scribe. 04/12/2017. 4:11 AM.  History   Chief Complaint Chief Complaint  Patient presents with  . Chest Pain    HPI Comments Amy Sexton is a 31 y.o. female with a PMHx significant for ADHD and asthma, who presents to the Emergency Department with complaints of sudden onset mid-sternal chest pain, which began while she was driving W09x10 hours ago.  Pain has been intermittent since it began, currently described as dull and throbbing.  It has improved since onset, currently rated a 6/10 in severity.  Pt reports a cough, which does not make her sx worse.  No alleviating factors noted.  No abdominal pain.  No Hx of PE/DVT, recent long travel, surgery, fracture, prolonged immobilization, hormone use.  No hx of cancer. No hemoptysis. No FMHx of MI.  The history is provided by the patient and medical records. No language interpreter was used.    Past Medical History:  Diagnosis Date  . ADHD (attention deficit hyperactivity disorder)   . Asthma     There are no active problems to display for this patient.   History reviewed. No pertinent surgical history.  OB History    No data available       Home Medications    Prior to Admission medications   Medication Sig Start Date End Date Taking? Authorizing Provider  cyclobenzaprine (FLEXERIL) 5 MG tablet Take 1 tablet (5 mg total) by mouth 3 (three) times daily as needed. Patient taking differently: Take 10 mg by mouth 3 (three) times daily as needed.  03/09/17  Yes Wallis BambergMani, Mario, PA-C  naproxen sodium (ANAPROX) 220 MG tablet Take 440 mg by mouth 2 (two) times daily with a meal.   Yes [provider]    Family History Family History  Problem  Relation Age of Onset  . Hypertension Mother   . Heart disease Maternal Grandmother   . Hyperlipidemia Maternal Grandmother     Social History Social History  Substance Use Topics  . Smoking status: Current Every Day Smoker    Packs/day: 0.50    Years: 10.00    Types: Cigarettes  . Smokeless tobacco: Never Used  . Alcohol use 1.8 oz/week    1 Glasses of wine, 2 Cans of beer per week     Allergies   Patient has no known allergies.   Review of Systems Review of Systems  Constitutional: Negative for chills and fever.  HENT: Negative for congestion and rhinorrhea.   Eyes: Negative for redness and visual disturbance.  Respiratory: Positive for cough. Negative for shortness of breath and wheezing.   Cardiovascular: Positive for chest pain. Negative for palpitations.  Gastrointestinal: Negative for abdominal pain, nausea and vomiting.  Genitourinary: Negative for dysuria and urgency.  Musculoskeletal: Negative for arthralgias and myalgias.  Skin: Negative for pallor and wound.  Neurological: Negative for dizziness and headaches.  All other systems reviewed and are negative.  Physical Exam Updated Vital Signs BP 119/81   Pulse 71   Temp 98.4 F (36.9 C) (Oral)   Resp 20   Ht 5\' 2"  (1.575 m)   Wt 98.4 kg (217 lb)   LMP 04/12/2017   SpO2 98%   BMI 39.69 kg/m   Physical Exam  Constitutional: She is oriented to person, place, and time. She appears well-developed and well-nourished. No distress.  HENT:  Head: Normocephalic and atraumatic.  Eyes: EOM are normal. Pupils are equal, round, and reactive to light.  Neck: Normal range of motion. Neck supple.  Cardiovascular: Normal rate and regular rhythm.  Exam reveals no gallop and no friction rub.   No murmur heard. Pulmonary/Chest: Effort normal. She has no wheezes. She has no rales.  Chest pain is reproducible with palpation of anterior chest wall.  Upper airway noises.   Abdominal: Soft. She exhibits no distension.  There is no tenderness.  Musculoskeletal: Normal range of motion. She exhibits no edema or tenderness.  No extremity edema.  Neurological: She is alert and oriented to person, place, and time.  Skin: Skin is warm and dry. She is not diaphoretic.  Psychiatric: She has a normal mood and affect. Her behavior is normal.  Nursing note and vitals reviewed.  ED Treatments / Results  DIAGNOSTIC STUDIES: Oxygen Saturation is 98% on room air, normal by my interpretation.    COORDINATION OF CARE: 3:35 AM Discussed treatment plan with pt at bedside and pt agreed to plan.  Labs (all labs ordered are listed, but only abnormal results are displayed) Labs Reviewed  BASIC METABOLIC PANEL - Abnormal; Notable for the following:       Result Value   Chloride 112 (*)    Glucose, Bld 103 (*)    Calcium 8.5 (*)    All other components within normal limits  CBC - Abnormal; Notable for the following:    RBC 5.30 (*)    Hemoglobin 11.7 (*)    MCV 68.7 (*)    MCH 22.1 (*)    RDW 16.4 (*)    All other components within normal limits  I-STAT TROPOININ, ED  POCT I-STAT TROPONIN I    EKG  EKG Interpretation  Date/Time:  Saturday April 11 2017 23:52:12 EDT Ventricular Rate:  83 PR Interval:    QRS Duration: 85 QT Interval:  352 QTC Calculation: 414 R Axis:   21 Text Interpretation:  Sinus rhythm Low voltage, precordial leads No significant change since last tracing Confirmed by Melene Plan [phone]) on 04/12/2017 3:10:09 AM       Radiology Dg Chest 2 View  Result Date: 04/12/2017 CLINICAL DATA:  Chest pain EXAM: CHEST  2 VIEW COMPARISON:  Chest radiograph 03/03/2016 FINDINGS: The heart size and mediastinal contours are within normal limits. Both lungs are clear. The visualized skeletal structures are unremarkable. IMPRESSION: No active cardiopulmonary disease. Electronically Signed   By: Deatra Robinson M.D.   On: 04/12/2017 00:35    Procedures Procedures (including critical care  time)  Medications Ordered in ED Medications - No data to display   Initial Impression / Assessment and Plan / ED Course  I have reviewed the triage vital signs and the nursing notes.  Pertinent labs & imaging results that were available during my care of the patient were reviewed by me and considered in my medical decision making (see chart for details).     31 yo F with a chief complaint of chest wall pain. She had labs the chest x-ray troponin done in triage are unremarkable. Discharge home.  5:07 AM:  I have discussed the diagnosis/risks/treatment options with the patient and family and believe the pt to be eligible for discharge home to follow-up with PCP. We also discussed returning to the ED immediately if new or worsening sx occur. We discussed the  sx which are most concerning (e.g., sudden worsening pain, fever, inability to tolerate by mouth) that necessitate immediate return. Medications administered to the patient during their visit and any new prescriptions provided to the patient are listed below.  Medications given during this visit Medications - No data to display   The patient appears reasonably screen and/or stabilized for discharge and I doubt any other medical condition or other [facility] requiring further screening, evaluation, or treatment in the ED at this time prior to discharge.    Final Clinical Impressions(s) / ED Diagnoses   Final diagnoses:  Chest wall pain   I personally performed the services described in this documentation, which was scribed in my presence. The recorded information has been reviewed and is accurate.   New Prescriptions Discharge Medication List as of 04/12/2017  4:12 AM       Melene Plan, DO 04/12/17 0507

## 2017-04-12 NOTE — Discharge Instructions (Signed)
Take 4 over the counter ibuprofen tablets 3 times a day or 2 over-the-counter naproxen tablets twice a day for pain. Also take tylenol 1000mg(2 extra strength) four times a day.    

## 2017-04-17 ENCOUNTER — Encounter: Payer: Self-pay | Admitting: Urgent Care

## 2017-04-25 ENCOUNTER — Encounter: Payer: Self-pay | Admitting: Urgent Care

## 2017-04-25 ENCOUNTER — Ambulatory Visit (INDEPENDENT_AMBULATORY_CARE_PROVIDER_SITE_OTHER): Payer: BLUE CROSS/BLUE SHIELD | Admitting: Urgent Care

## 2017-04-25 VITALS — BP 112/79 | HR 82 | Temp 98.7°F | Resp 16 | Ht 63.5 in | Wt 220.0 lb

## 2017-04-25 DIAGNOSIS — M542 Cervicalgia: Secondary | ICD-10-CM | POA: Diagnosis not present

## 2017-04-25 DIAGNOSIS — M25512 Pain in left shoulder: Secondary | ICD-10-CM | POA: Diagnosis not present

## 2017-04-25 DIAGNOSIS — M62838 Other muscle spasm: Secondary | ICD-10-CM | POA: Diagnosis not present

## 2017-04-25 MED ORDER — PREDNISONE 20 MG PO TABS: ORAL_TABLET | ORAL | 0 refills | Status: DC

## 2017-04-25 NOTE — Patient Instructions (Addendum)
Shoulder Pain Many things can cause shoulder pain, including:  An injury to the area.  Overuse of the shoulder.  Arthritis.  The source of the pain can be:  Inflammation.  An injury to the shoulder joint.  An injury to a tendon, ligament, or bone.  Follow these instructions at home: Take these actions to help with your pain:  Squeeze a soft ball or a foam pad as much as possible. This helps to keep the shoulder from swelling. It also helps to strengthen the arm.  Take over-the-counter and prescription medicines only as told by your health care provider.  If directed, apply ice to the area: ? Put ice in a plastic bag. ? Place a towel between your skin and the bag. ? Leave the ice on for 20 minutes, 2-3 times per day. Stop applying ice if it does not help with the pain.  If you were given a shoulder sling or immobilizer: ? Wear it as told. ? Remove it to shower or bathe. ? Move your arm as little as possible, but keep your hand moving to prevent swelling.  Contact a health care provider if:  Your pain gets worse.  Your pain is not relieved with medicines.  New pain develops in your arm, hand, or fingers. Get help right away if:  Your arm, hand, or fingers: ? Tingle. ? Become numb. ? Become swollen. ? Become painful. ? Turn white or blue. This information is not intended to replace advice given to you by your health care provider. Make sure you discuss any questions you have with your health care provider. Document Released: 07/23/2005 Document Revised: 06/08/2016 Document Reviewed: 02/05/2015 Elsevier Interactive Patient Education  2017 [employer] Inc.    Muscle Cramps and Spasms Muscle cramps and spasms occur when a muscle or muscles tighten and you have no control over this tightening (involuntary muscle contraction). They are a common problem and can develop in any muscle. The most common place is in the calf muscles of the leg. Muscle cramps and muscle spasms  are both involuntary muscle contractions, but there are some differences between the two:  Muscle cramps are painful. They come and go and may last a few seconds to 15 minutes. Muscle cramps are often more forceful and last longer than muscle spasms.  Muscle spasms may or may not be painful. They may also last just a few seconds or much longer.  Certain medical conditions, such as diabetes or Parkinson disease, can make it more likely to develop cramps or spasms. However, cramps or spasms are usually not caused by a serious underlying problem. Common causes include:  Overexertion.  Overuse from repetitive motions, or doing the same thing over and over.  Remaining in a certain position for a long period of time.  Improper preparation, form, or technique while playing a sport or doing an activity.  Dehydration.  Injury.  Side effects of some medicines.  Abnormally low levels of the salts and ions in your blood (electrolytes), especially potassium and calcium. This could happen if you are taking water pills (diuretics) or if you are pregnant.  In many cases, the cause of muscle cramps or spasms is unknown. Follow these instructions at home:  Stay well hydrated. Drink enough fluid to keep your urine clear or pale yellow.  Try massaging, stretching, and relaxing the affected muscle.  If directed, apply heat to tight or tense muscles as often as told by your health care provider. Use the heat source that your  health care provider recommends, such as a moist heat pack or a heating pad. ? Place a towel between your skin and the heat source. ? Leave the heat on for 20-30 minutes. ? Remove the heat if your skin turns bright red. This is especially important if you are unable to feel pain, heat, or cold. You may have a greater risk of getting burned.  If directed, put ice on the affected area. This may help if you are sore or have pain after a cramp or spasm. ? Put ice in a plastic  bag. ? Place a towel between your skin and the bag. ? Leavethe ice on for 20 minutes, 2-3 times a day.  Take over-the-counter and prescription medicines only as told by your health care provider.  Pay attention to any changes in your symptoms. Contact a health care provider if:  Your cramps or spasms get more severe or happen more often.  Your cramps or spasms do not improve over time. This information is not intended to replace advice given to you by your health care provider. Make sure you discuss any questions you have with your health care provider. Document Released: 04/04/2002 Document Revised: 11/14/2015 Document Reviewed: 07/17/2015 Elsevier Interactive Patient Education  2018 [employer].   IF you received an x-ray today, you will receive an invoice from [facility]. Please contact [facility] at [phone] with questions or concerns regarding your invoice.   IF you received labwork today, you will receive an invoice from [place]. Please contact [facility] at [phone] with questions or concerns regarding your invoice.   Our billing staff will not be able to assist you with questions regarding bills from these companies.  You will be contacted with the lab results as soon as they are available. The fastest way to get your results is to activate your My Chart account. Instructions are located on the last page of this paperwork. If you have not heard from [place] regarding the results in 2 weeks, please contact this office.

## 2017-04-25 NOTE — Progress Notes (Signed)
    MRN: [id number] DOB: 04-24-1986  Subjective:   Amy GibbsChiquita Sexton is a 31 y.o. female presenting for follow up on left shoulder pain. Reports that it is worsening. Her pain is burning sensation, tightness in her left shoulder. Has multiple episodes of pain, ~4x/week, for her neck, trapezius and left shoulder. Has had shooting pain from her neck into her left hand, ~2x this past week. Denies weakness, numbness or tingling, trauma, falls. She has seen an orthopedist, was told her rotator cuff needs further imaging but her insurance denied the MRI. She has not established follow up with them. She is requesting FMLA/disability today given that she has used most of her PTO already.   Michael BostonChiquita has a current medication list which includes the following prescription(s): cyclobenzaprine, ibuprofen, and naproxen sodium. Also has No Known Allergies. Michael BostonChiquita  has a past medical history of ADHD (attention deficit hyperactivity disorder) and Asthma. Also denies past surgical history.  Objective:   Vitals: BP 112/79   Pulse 82   Temp 98.7 F (37.1 C) (Oral)   Resp 16   Ht 5' 3.5" (1.613 m)   Wt 220 lb (99.8 kg)   LMP 04/12/2017   SpO2 98%   BMI 38.36 kg/m   Physical Exam  Constitutional: She is oriented to person, place, and time. She appears well-developed and well-nourished.  Cardiovascular: Normal rate.   Pulmonary/Chest: Effort normal.  Musculoskeletal:       Left shoulder: She exhibits decreased range of motion (over 90 degrees) and tenderness (over deltoid and AC joint, negative Hawkin and Neer tests). She exhibits no bony tenderness, no swelling, no effusion, no crepitus, no deformity, no spasm and normal strength.       Cervical back: She exhibits decreased range of motion (full flexion and extension due to pain per patient), tenderness (over base of neck) and spasm (over left trapezius). She exhibits no bony tenderness, no swelling, no edema and no deformity.       Back:  Neurological:  She is alert and oriented to person, place, and time. She displays normal reflexes.  Skin: Skin is warm and dry.   Assessment and Plan :   1. Pain in joint of left shoulder 2. Trapezius muscle spasm 3. Neck pain - Disability work completed for patient. We really need to obtain an MRI. Patient will use short steroid course for now. Start PT. She will also contact her insurance provider to request information about what they need to approve the MRI. Follow up after completion of PT.  Wallis BambergMario Georg Ang, PA-C [facility] [phone] 04/25/2017 9:53 AM

## 2017-05-10 ENCOUNTER — Encounter (HOSPITAL_BASED_OUTPATIENT_CLINIC_OR_DEPARTMENT_OTHER): Payer: Self-pay

## 2017-05-10 ENCOUNTER — Emergency Department (HOSPITAL_BASED_OUTPATIENT_CLINIC_OR_DEPARTMENT_OTHER): Payer: BLUE CROSS/BLUE SHIELD

## 2017-05-10 ENCOUNTER — Emergency Department (HOSPITAL_BASED_OUTPATIENT_CLINIC_OR_DEPARTMENT_OTHER)
Admission: EM | Admit: 2017-05-10 | Discharge: 2017-05-10 | Disposition: A | Discharge status: Home / Self Care | Payer: BLUE CROSS/BLUE SHIELD | Attending: Emergency Medicine | Admitting: Emergency Medicine

## 2017-05-10 DIAGNOSIS — Z79899 Other long term (current) drug therapy: Secondary | ICD-10-CM | POA: Insufficient documentation

## 2017-05-10 DIAGNOSIS — J45909 Unspecified asthma, uncomplicated: Secondary | ICD-10-CM | POA: Insufficient documentation

## 2017-05-10 DIAGNOSIS — S62639A Displaced fracture of distal phalanx of unspecified finger, initial encounter for closed fracture: Secondary | ICD-10-CM

## 2017-05-10 DIAGNOSIS — W2100XA Struck by hit or thrown ball, unspecified type, initial encounter: Secondary | ICD-10-CM | POA: Diagnosis not present

## 2017-05-10 DIAGNOSIS — S62667A Nondisplaced fracture of distal phalanx of left little finger, initial encounter for closed fracture: Secondary | ICD-10-CM | POA: Diagnosis not present

## 2017-05-10 DIAGNOSIS — Y939 Activity, unspecified: Secondary | ICD-10-CM | POA: Insufficient documentation

## 2017-05-10 DIAGNOSIS — S6992XA Unspecified injury of left wrist, hand and finger(s), initial encounter: Secondary | ICD-10-CM | POA: Diagnosis not present

## 2017-05-10 DIAGNOSIS — F17210 Nicotine dependence, cigarettes, uncomplicated: Secondary | ICD-10-CM | POA: Diagnosis not present

## 2017-05-10 DIAGNOSIS — Y999 Unspecified external cause status: Secondary | ICD-10-CM | POA: Insufficient documentation

## 2017-05-10 DIAGNOSIS — M20012 Mallet finger of left finger(s): Secondary | ICD-10-CM | POA: Insufficient documentation

## 2017-05-10 DIAGNOSIS — Y929 Unspecified place or not applicable: Secondary | ICD-10-CM | POA: Insufficient documentation

## 2017-05-10 DIAGNOSIS — S62637A Displaced fracture of distal phalanx of left little finger, initial encounter for closed fracture: Secondary | ICD-10-CM | POA: Diagnosis not present

## 2017-05-10 DIAGNOSIS — M79645 Pain in left finger(s): Secondary | ICD-10-CM | POA: Diagnosis not present

## 2017-05-10 MED ORDER — HYDROCODONE-ACETAMINOPHEN 5-325 MG PO TABS: 1.0000 | ORAL_TABLET | Freq: Four times a day (QID) | ORAL | 0 refills | Status: DC | PRN

## 2017-05-10 NOTE — ED Provider Notes (Signed)
[facility] Provider Note   CSN: [id number] Arrival date & time: 05/10/17  1511     History   Chief Complaint Chief Complaint  Patient presents with  . Finger Injury    HPI Amy Sexton is a 31 y.o. female who presents with left fifth digit pain. Patient reports that pain began after she attempted to catch a football and hit her left pinky and bent her left wrist back. Patient reports that she has taken ibuprofen with no improvement in pain. She reports limited range of motion of left fifth digit. Patient denies any numbness/weakness.  The history is provided by the patient.    Past Medical History:  Diagnosis Date  . ADHD (attention deficit hyperactivity disorder)   . Asthma     There are no active problems to display for this patient.   History reviewed. No pertinent surgical history.  OB History    No data available       Home Medications    Prior to Admission medications   Medication Sig Start Date End Date Taking? Authorizing Provider  cyclobenzaprine (FLEXERIL) 5 MG tablet Take 1 tablet (5 mg total) by mouth 3 (three) times daily as needed. Patient taking differently: Take 10 mg by mouth 3 (three) times daily as needed.  03/09/17   [name], PA-C  HYDROcodone-acetaminophen (NORCO/VICODIN) 5-325 MG tablet Take 1-2 tablets by mouth every 6 (six) hours as needed. 05/10/17   Maxwell Caul, PA-C  ibuprofen (ADVIL,MOTRIN) 200 MG tablet Take 200 mg by mouth every 6 (six) hours as needed.    [provider]  naproxen sodium (ANAPROX) 220 MG tablet Take 440 mg by mouth 2 (two) times daily as needed.     [provider]  predniSONE (DELTASONE) 20 MG tablet Take 2 tablets daily with breakfast. 04/25/17   [name], PA-C    Family History Family History  Problem Relation Age of Onset  . Hypertension Mother   . Heart disease Maternal Grandmother   . Hyperlipidemia Maternal Grandmother     Social History Social History    Substance Use Topics  . Smoking status: Current Every Day Smoker    Packs/day: 0.50    Years: 10.00    Types: Cigarettes  . Smokeless tobacco: Never Used  . Alcohol use 1.8 oz/week    1 Glasses of wine, 2 Cans of beer per week     Allergies   Patient has no known allergies.   Review of Systems Review of Systems  Musculoskeletal:       Left fifth digit pain   Neurological: Negative for weakness and numbness.     Physical Exam Updated Vital Signs BP 116/81 (BP Location: Left Arm)   Pulse 88   Temp 98.5 F (36.9 C) (Oral)   Resp 16   Ht 5\' 2"  (1.575 m)   Wt 101.2 kg (223 lb)   LMP 04/12/2017   SpO2 100%   BMI 40.79 kg/m   Physical Exam  Constitutional: She appears well-developed and well-nourished.  HENT:  Head: Normocephalic and atraumatic.  Eyes: Conjunctivae and EOM are normal. Right eye exhibits no discharge. Left eye exhibits no discharge. No scleral icterus.  Cardiovascular:  Pulses:      Radial pulses are 2+ on the right side, and 2+ on the left side.  Pulmonary/Chest: Effort normal.  Musculoskeletal:  Tenderness to palpation to the DIP joint of the left digit. Fixed flexion deformity noted to the left fifth digit. Patient is not able  to actively extend left fifth digit. Tenderness palpation to the left fourth and fifth MCPs. Flexion and extension of wrist intact but with subjective reports of pain.  Neurological: She is alert.  Sensation intact throughout all major nerve distributions of the hand.  Skin: Skin is warm and dry.  Psychiatric: She has a normal mood and affect. Her speech is normal and behavior is normal.  Nursing note and vitals reviewed.    ED Treatments / Results  Labs (all labs ordered are listed, but only abnormal results are displayed) Labs Reviewed - No data to display  EKG  EKG Interpretation None       Radiology Dg Hand Complete Left  Result Date: 05/10/2017 CLINICAL DATA:  Left fifth finger pain after injury today  EXAM: LEFT HAND - COMPLETE 3+ VIEW COMPARISON:  None. FINDINGS: Flexion deformity at the distal interphalangeal joint in the left fifth finger. Tiny osseous fragment abutting the dorsal distal aspect of the middle phalanx in the left fifth finger on the lateral view, suspicious for dorsal plate avulsion fracture fragment arising from the distal phalanx with proximal 3 mm displacement of the avulsion fragment. No additional fracture. No dislocation. No suspicious focal osseous lesion. No appreciable degenerative or erosive arthropathy. No radiopaque foreign body. IMPRESSION: Findings are compatible with a mildly displaced dorsal plate avulsion fracture at the distal phalanx left fifth finger with associated flexion deformity at the DIP joint left fifth finger. Electronically Signed   By: Delbert PhenixJason A Poff M.D.   On: 05/10/2017 15:58    Procedures Procedures (including critical care time)  Medications Ordered in ED Medications - No data to display   Initial Impression / Assessment and Plan / ED Course  I have reviewed the triage vital signs and the nursing notes.  Pertinent labs & imaging results that were available during my care of the patient were reviewed by me and considered in my medical decision making (see chart for details).     31 year old female who presents with left fifth digit pain that began earlier today. Patient is afebrile, non-toxic appearing, sitting comfortably on examination table. Patient is neurovascularly intact. Physical exam shows flexion deformity of the left fifth digit. Consider mallet finger vs fracture vs dislocation. XR of left hand ordered to evaluate for acute fracture or dislocation.   X-ray reviewed. There is a small avulsion fracture at the distal phalanx. Physical and imaging are consistent with a mallet finger. Discussed results with patient. Will plan to splint digit in extension. Instructed patient to wear the splint until she is evaluated by hand doctor. Will  provide hand referral for patient to follow-up in outpatient basis. Conservative therapy discussed. Patient instructed follow-up with referred hand doctor. Strict return precautions discussed. Patient's breasts understanding and agreement to plan.   Final Clinical Impressions(s) / ED Diagnoses   Final diagnoses:  Mallet finger of left hand  Closed avulsion fracture of distal phalanx of finger, initial encounter    New Prescriptions New Prescriptions   HYDROCODONE-ACETAMINOPHEN (NORCO/VICODIN) 5-325 MG TABLET    Take 1-2 tablets by mouth every 6 (six) hours as needed.     Maxwell CaulLayden, Barnaby Rippeon A, PA-C 05/10/17 [id number]    Geoffery Lyonselo, Douglas, MD 05/10/17 2217

## 2017-05-10 NOTE — Discharge Instructions (Signed)
You can take Tylenol or Ibuprofen as directed for pain. You can take the pain medication as directed for breakthrough pain.  Wear the splint as directed.  Follow the RICE (Rest, Ice, Compression, Elevation) protocol as directed.   Follow-up with the referred hand doctor for further evaluation. Call his office and arrange an appointment in the next few days.   Return to the Emergency Department for any worsening pain, redness/swelling or any other worsening or concerning symptoms.

## 2017-05-10 NOTE — ED Triage Notes (Signed)
Pt c/o left pinky pain after attempting to catch a ball today.

## 2017-05-10 NOTE — ED Notes (Signed)
ED Provider at bedside. 

## 2017-05-15 ENCOUNTER — Encounter (INDEPENDENT_AMBULATORY_CARE_PROVIDER_SITE_OTHER): Payer: Self-pay | Admitting: Orthopaedic Surgery

## 2017-05-15 ENCOUNTER — Ambulatory Visit (INDEPENDENT_AMBULATORY_CARE_PROVIDER_SITE_OTHER): Payer: BLUE CROSS/BLUE SHIELD | Admitting: Orthopaedic Surgery

## 2017-05-15 DIAGNOSIS — M25542 Pain in joints of left hand: Secondary | ICD-10-CM | POA: Diagnosis not present

## 2017-05-15 DIAGNOSIS — M25342 Other instability, left hand: Secondary | ICD-10-CM | POA: Diagnosis not present

## 2017-05-15 DIAGNOSIS — M25642 Stiffness of left hand, not elsewhere classified: Secondary | ICD-10-CM | POA: Diagnosis not present

## 2017-05-15 DIAGNOSIS — M20012 Mallet finger of left finger(s): Secondary | ICD-10-CM

## 2017-05-15 NOTE — Progress Notes (Signed)
Office Visit Note   Patient: Amy Sexton           Date of Birth: 04-15-1986           MRN: [id number] Visit Date: 05/15/2017              Requested by: Wallis BambergMani, Mario, PA-C [address] [place], [place] [id number] PCP: Wallis BambergMani, Mario, PA-C   Assessment & Plan: Visit Diagnoses:  1. Mallet finger of left hand     Plan: Overall pressure left small mallet finger. Referral to hand therapy for custom sling was given today. May return to work with lifting less than 5 pounds for 8 weeks. Discussed the condition with the patient. Questions encouraged and answered. Follow-up in 2 weeks for recheck  Follow-Up Instructions: Return in about 2 weeks (around 05/29/2017).   Orders:  No orders of the defined types were placed in this encounter.  No orders of the defined types were placed in this encounter.     Procedures: No procedures performed   Clinical Data: No additional findings.   Subjective: Chief Complaint  Patient presents with  . Left Hand - Pain    Patient is a 31 year old female who injured her left small finger on 05/10/2017 while trying to catch a football. Her small finger was forcibly flexed down by the football. X-rays were taken which showed a small avulsion fracture of the distal phalanx. Her pain is 8 out of 10. She also complains of some tingling. The pain does not radiate.    Review of Systems  Constitutional: Negative.   HENT: Negative.   Eyes: Negative.   Respiratory: Negative.   Cardiovascular: Negative.   Endocrine: Negative.   Musculoskeletal: Negative.   Neurological: Negative.   Hematological: Negative.   Psychiatric/Behavioral: Negative.   All other systems reviewed and are negative.    Objective: Vital Signs: There were no vitals taken for this visit.  Physical Exam  Constitutional: She is oriented to person, place, and time. She appears well-developed and well-nourished.  HENT:  Head: Normocephalic and atraumatic.  Eyes: EOM are normal.    Neck: Neck supple.  Pulmonary/Chest: Effort normal.  Abdominal: Soft.  Neurological: She is alert and oriented to person, place, and time.  Skin: Skin is warm. Capillary refill takes less than 2 seconds.  Psychiatric: She has a normal mood and affect. Her behavior is normal. Judgment and thought content normal.  Nursing note and vitals reviewed.   Ortho Exam Left small finger shows an extensor lag of 45. He not able to actively extend her DIP joint. Central slip is intact. Specialty Comments:  No specialty comments available.  Imaging: No results found.   PMFS History: There are no active problems to display for this patient.  Past Medical History:  Diagnosis Date  . ADHD (attention deficit hyperactivity disorder)   . Asthma     Family History  Problem Relation Age of Onset  . Hypertension Mother   . Heart disease Maternal Grandmother   . Hyperlipidemia Maternal Grandmother     No past surgical history on file. Social History   Occupational History  . Not on file.   Social History Main Topics  . Smoking status: Current Every Day Smoker    Packs/day: 0.50    Years: 10.00    Types: Cigarettes  . Smokeless tobacco: Never Used  . Alcohol use 1.8 oz/week    1 Glasses of wine, 2 Cans of beer per week  . Drug use: No  .  Sexual activity: Not on file

## 2017-05-18 ENCOUNTER — Encounter: Payer: Self-pay | Admitting: Physical Therapy

## 2017-05-18 ENCOUNTER — Ambulatory Visit: Payer: BLUE CROSS/BLUE SHIELD | Attending: Family Medicine | Admitting: Physical Therapy

## 2017-05-18 DIAGNOSIS — R252 Cramp and spasm: Secondary | ICD-10-CM | POA: Diagnosis not present

## 2017-05-18 DIAGNOSIS — M542 Cervicalgia: Secondary | ICD-10-CM | POA: Diagnosis not present

## 2017-05-18 NOTE — Therapy (Signed)
[facility] [address] [place], [place], [address] Phone: [phone]   Fax:  [phone]  Physical Therapy Evaluation  Patient Details  Name: Amy Sexton MRN: [id number] Date of Birth: 17-Nov-1985 Referring Provider: Urban Gibson  Encounter Date: 05/18/2017      PT End of Session - 05/18/17 1641    Visit Number 1   Date for PT Re-Evaluation 07/19/17   PT Start Time 1615   PT Stop Time 1704   PT Time Calculation (min) 49 min   Activity Tolerance Patient tolerated treatment well   Behavior During Therapy [facility] for tasks assessed/performed      Past Medical History:  Diagnosis Date  . ADHD (attention deficit hyperactivity disorder)   . Asthma     History reviewed. No pertinent surgical history.  There were no vitals filed for this visit.       Subjective Assessment - 05/18/17 1616    Subjective Patient reports that she has been having neck and shoulder pain about a year.  She is unsure of a cause, she reports that she woke up with the pain.  X-rays were negative.   Pertinent History Recently broke her left 5th finger, in splint now   Limitations Lifting;House hold activities   Patient Stated Goals have less pain   Currently in Pain? Yes   Pain Score 8    Pain Location Neck  mostly upper traps, mostly left   Pain Orientation Left   Pain Descriptors / Indicators Aching;Burning   Pain Type Chronic pain   Pain Onset More than a month ago   Pain Frequency Constant   Aggravating Factors  any activity, reaching and lifting will increase pain to 9-10/10   Pain Relieving Factors pain meds, rest pain will be 2-3/10   Effect of Pain on Daily Activities limits everything            [facility] PT Assessment - 05/18/17 0001      Assessment   Medical Diagnosis shoulder and neck pain   Referring Provider Mani   Onset Date/Surgical Date 05/18/16   Hand Dominance Right   Prior Therapy no     Precautions   Precautions None     Balance Screen   Has the patient fallen in the past 6 months No   Has the patient had a decrease in activity level because of a fear of falling?  No   Is the patient reluctant to leave their home because of a fear of falling?  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   Leisure no exercise     Posture/Postural Control   Posture Comments fwd rounded shoulders, fwd head     ROM / Strength   AROM / PROM / Strength AROM;Strength     AROM   Overall AROM Comments cervical ROM was decreased 50% for all motions with pain in the left cervical and upper trap area   AROM Assessment Site Shoulder   Right/Left Shoulder Left   Left Shoulder Flexion 150 Degrees   Left Shoulder ABduction 120 Degrees   Left Shoulder Internal Rotation 40 Degrees   Left Shoulder External Rotation 70 Degrees     Strength   Overall Strength Comments shoulder strength 4/5 with some left  upper trap pain     Palpation   Palpation comment she is tight and tender in the upper traps and in the cervical area             Objective measurements completed on examination: See above findings.          [facility] Adult PT Treatment/Exercise - 05/18/17 0001      Modalities   Modalities Electrical Stimulation;Moist Heat     Moist Heat Therapy   Number Minutes Moist Heat 15 Minutes   Moist Heat Location Cervical     Electrical Stimulation   Electrical Stimulation Location left upper trap area   Electrical Stimulation Action IFC   Electrical Stimulation Parameters supine   Electrical Stimulation Goals Pain                PT Education - 05/18/17 1640    Education provided Yes   Education Details cervical and scapular retraction, shrugs and upper trap stretches   Person(s) Educated Patient    Methods Explanation;Demonstration;Handout;Verbal cues   Comprehension Verbalized understanding          PT Short Term Goals - 05/18/17 1645      PT SHORT TERM GOAL #1   Title independent with initial HEP   Time 2   Period Weeks   Status New           PT Long Term Goals - 05/18/17 1646      PT LONG TERM GOAL #1   Title understand proper posture and body mechanics   Time 8   Period Weeks   Status New   Target Date 07/13/17     PT LONG TERM GOAL #2   Title decrease pain 50%   Time 8   Period Weeks   Status New   Target Date 07/13/17     PT LONG TERM GOAL #3   Title increase cervical ROM 25%   Time 8   Period Weeks   Status New   Target Date 07/13/17     PT LONG TERM GOAL #4   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  X-rays negative for the neck and the shoulder.  Her cervical ROM was decreased 50%, has mild limitation of the shoulder.  Her issue seems to be mostly muscular in nature with palpable tightness, knots and tenderness in the left upper trap   History and Personal Factors relevant to plan of care: currently has a broken 5th distal phalange   Clinical Presentation Stable   Clinical Decision Making Low   Rehab Potential Good   PT Frequency 2x / week   PT Duration 8 weeks   PT Treatment/Interventions ADLs/Self Care Home Management;Cryotherapy;Electrical Stimulation;Moist Heat;Traction;Ultrasound;Therapeutic activities;Therapeutic exercise;Neuromuscular re-education;Patient/family education;Manual techniques   PT Next Visit Plan start gym exercises as tolerated, she does have the left 5th finger that is fractured and splinted   Consulted and Agree with Plan of Care Patient      Patient will benefit from skilled therapeutic intervention in order  to improve the following deficits and impairments:  Decreased strength, Postural dysfunction, Improper body mechanics, Pain, Increased muscle spasms, Decreased range of motion  Visit Diagnosis: Cervicalgia - Plan: PT plan of  care cert/re-cert  Cramp and spasm - Plan: PT plan of care cert/re-cert     Problem List There are no active problems to display for this patient.   Jearld LeschALBRIGHT,MICHAEL W., PT 05/18/2017, 4:53 PM  [facility] [address] [place], [place], [address] Phone: [phone]   Fax:  [phone]  Name: Amy GibbsChiquita Sexton MRN: [id number] Date of Birth: [birth year]

## 2017-05-22 ENCOUNTER — Encounter: Payer: Self-pay | Admitting: Physical Therapy

## 2017-05-22 ENCOUNTER — Ambulatory Visit: Payer: BLUE CROSS/BLUE SHIELD | Admitting: Physical Therapy

## 2017-05-22 DIAGNOSIS — M542 Cervicalgia: Secondary | ICD-10-CM | POA: Diagnosis not present

## 2017-05-22 DIAGNOSIS — R252 Cramp and spasm: Secondary | ICD-10-CM

## 2017-05-22 NOTE — Therapy (Signed)
[facility] [address] [place], [place], [address] Phone: [phone]   Fax:  [phone]  Physical Therapy Treatment  Patient Details  Name: Amy Sexton MRN: [id number] Date of Birth: 1986/05/23 Referring Provider: Urban GibsonMani  Encounter Date: 05/22/2017      PT End of Session - 05/22/17 0955    Visit Number 2   Date for PT Re-Evaluation 07/19/17   PT Start Time 0930   PT Stop Time 1020   PT Time Calculation (min) 50 min      Past Medical History:  Diagnosis Date  . ADHD (attention deficit hyperactivity disorder)   . Asthma     History reviewed. No pertinent surgical history.  There were no vitals filed for this visit.      Subjective Assessment - 05/22/17 0931    Subjective doing okay   Currently in Pain? Yes   Pain Score 7    Pain Location Neck                         [facility] Adult PT Treatment/Exercise - 05/22/17 0001      Exercises   Exercises Neck;Shoulder     Neck Exercises: Machines for Strengthening   UBE (Upper Arm Bike) L 3 3 fwd/3 back   Cybex Row 20# 2 sets 10   Other Machines for Strengthening lat pull 20# 2 sets 10     Neck Exercises: Standing   Neck Retraction 15 reps  with ball   Wall Push Ups 10 reps   Other Standing Exercises 4# shruggs and rolls 15 times each     Moist Heat Therapy   Number Minutes Moist Heat 15 Minutes   Moist Heat Location Cervical     Electrical Stimulation   Electrical Stimulation Location left upper trap area   Electrical Stimulation Action IFC   Electrical Stimulation Parameters supine   Electrical Stimulation Goals Pain                  PT Short Term Goals - 05/18/17 1645      PT SHORT TERM GOAL #1   Title independent with initial HEP   Time 2   Period Weeks   Status New           PT Long Term Goals - 05/18/17 1646      PT LONG TERM GOAL #1   Title understand proper posture and body mechanics   Time 8    Period Weeks   Status New   Target Date 07/13/17     PT LONG TERM GOAL #2   Title decrease pain 50%   Time 8   Period Weeks   Status New   Target Date 07/13/17     PT LONG TERM GOAL #3   Title increase cervical ROM 25%   Time 8   Period Weeks   Status New   Target Date 07/13/17     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 Cuing needed for trap compensation.   PT Next Visit Plan assess response to ex and progress as tol. ISSUE HEP  Patient will benefit from skilled therapeutic intervention in order to improve the following deficits and impairments:  Decreased strength, Postural dysfunction, Improper body mechanics, Pain, Increased muscle spasms, Decreased range of motion  Visit Diagnosis: Cervicalgia  Cramp and spasm     Problem List There are no active problems to display for this patient.   Erika Hussar,ANGIE PTA 05/22/2017, 9:56 AM  [facility] [address] [place], [place], [address] Phone: [phone]   Fax:  [phone]  Name: Amy Sexton MRN: [id number] Date of Birth: [birth year]

## 2017-05-23 ENCOUNTER — Emergency Department (HOSPITAL_BASED_OUTPATIENT_CLINIC_OR_DEPARTMENT_OTHER)
Admission: EM | Admit: 2017-05-23 | Discharge: 2017-05-23 | Disposition: A | Discharge status: Home / Self Care | Payer: BLUE CROSS/BLUE SHIELD | Attending: Emergency Medicine | Admitting: Emergency Medicine

## 2017-05-23 ENCOUNTER — Encounter (HOSPITAL_BASED_OUTPATIENT_CLINIC_OR_DEPARTMENT_OTHER): Payer: Self-pay | Admitting: *Deleted

## 2017-05-23 ENCOUNTER — Emergency Department (HOSPITAL_BASED_OUTPATIENT_CLINIC_OR_DEPARTMENT_OTHER): Payer: BLUE CROSS/BLUE SHIELD

## 2017-05-23 DIAGNOSIS — Y939 Activity, unspecified: Secondary | ICD-10-CM | POA: Diagnosis not present

## 2017-05-23 DIAGNOSIS — N3000 Acute cystitis without hematuria: Secondary | ICD-10-CM | POA: Diagnosis not present

## 2017-05-23 DIAGNOSIS — R109 Unspecified abdominal pain: Secondary | ICD-10-CM | POA: Diagnosis not present

## 2017-05-23 DIAGNOSIS — Y999 Unspecified external cause status: Secondary | ICD-10-CM | POA: Insufficient documentation

## 2017-05-23 DIAGNOSIS — A599 Trichomoniasis, unspecified: Secondary | ICD-10-CM | POA: Insufficient documentation

## 2017-05-23 DIAGNOSIS — X58XXXA Exposure to other specified factors, initial encounter: Secondary | ICD-10-CM | POA: Diagnosis not present

## 2017-05-23 DIAGNOSIS — F17210 Nicotine dependence, cigarettes, uncomplicated: Secondary | ICD-10-CM | POA: Insufficient documentation

## 2017-05-23 DIAGNOSIS — S39012A Strain of muscle, fascia and tendon of lower back, initial encounter: Secondary | ICD-10-CM | POA: Diagnosis not present

## 2017-05-23 DIAGNOSIS — Y929 Unspecified place or not applicable: Secondary | ICD-10-CM | POA: Diagnosis not present

## 2017-05-23 DIAGNOSIS — R1032 Left lower quadrant pain: Secondary | ICD-10-CM | POA: Diagnosis present

## 2017-05-23 DIAGNOSIS — J45909 Unspecified asthma, uncomplicated: Secondary | ICD-10-CM | POA: Insufficient documentation

## 2017-05-23 LAB — PREGNANCY, URINE: Preg Test, Ur: NEGATIVE

## 2017-05-23 LAB — URINALYSIS, ROUTINE W REFLEX MICROSCOPIC
Bilirubin Urine: NEGATIVE
GLUCOSE, UA: NEGATIVE mg/dL
Hgb urine dipstick: NEGATIVE
KETONES UR: NEGATIVE mg/dL
Nitrite: NEGATIVE
PH: 5 (ref 5.0–8.0)
Protein, ur: NEGATIVE mg/dL
SPECIFIC GRAVITY, URINE: 1.021 (ref 1.005–1.030)

## 2017-05-23 LAB — URINALYSIS, MICROSCOPIC (REFLEX): RBC / HPF: NONE SEEN RBC/hpf (ref 0–5)

## 2017-05-23 MED ORDER — CEPHALEXIN 250 MG PO CAPS: 500.0000 mg | ORAL_CAPSULE | Freq: Once | ORAL | Status: DC

## 2017-05-23 MED ORDER — LIDOCAINE HCL (PF) 1 % IJ SOLN
INTRAMUSCULAR | Status: AC
  Filled 2017-05-23: qty 5

## 2017-05-23 MED ORDER — METRONIDAZOLE 500 MG PO TABS: 500.0000 mg | ORAL_TABLET | Freq: Two times a day (BID) | ORAL | 0 refills | Status: DC

## 2017-05-23 MED ORDER — CEFTRIAXONE SODIUM 250 MG IJ SOLR
INTRAMUSCULAR | Status: AC
  Filled 2017-05-23: qty 500

## 2017-05-23 MED ORDER — METHOCARBAMOL 500 MG PO TABS
750.0000 mg | ORAL_TABLET | Freq: Once | ORAL | Status: AC
  Administered 2017-05-23: 750 mg via ORAL
  Filled 2017-05-23: qty 2

## 2017-05-23 MED ORDER — CEPHALEXIN 500 MG PO CAPS: 500.0000 mg | ORAL_CAPSULE | Freq: Two times a day (BID) | ORAL | 0 refills | Status: AC

## 2017-05-23 MED ORDER — AZITHROMYCIN 250 MG PO TABS
1000.0000 mg | ORAL_TABLET | Freq: Once | ORAL | Status: AC
  Administered 2017-05-23: 1000 mg via ORAL
  Filled 2017-05-23: qty 4

## 2017-05-23 MED ORDER — CEFTRIAXONE SODIUM 1 G IJ SOLR
500.0000 mg | Freq: Once | INTRAMUSCULAR | Status: AC
  Administered 2017-05-23: 500 mg via INTRAMUSCULAR

## 2017-05-23 MED ORDER — IBUPROFEN 800 MG PO TABS
800.0000 mg | ORAL_TABLET | Freq: Once | ORAL | Status: AC
  Administered 2017-05-23: 800 mg via ORAL
  Filled 2017-05-23: qty 1

## 2017-05-23 MED ORDER — METHOCARBAMOL 500 MG PO TABS: 500.0000 mg | ORAL_TABLET | Freq: Two times a day (BID) | ORAL | 0 refills | Status: DC

## 2017-05-23 MED ORDER — METRONIDAZOLE 500 MG PO TABS
500.0000 mg | ORAL_TABLET | Freq: Once | ORAL | Status: AC
  Administered 2017-05-23: 500 mg via ORAL
  Filled 2017-05-23: qty 1

## 2017-05-23 NOTE — ED Notes (Signed)
Pt unable to void at this time. 

## 2017-05-23 NOTE — ED Provider Notes (Signed)
[facility] Provider Note   CSN: [id number] Arrival date & time: 05/23/17  1823     History   Chief Complaint Chief Complaint  Patient presents with  . Flank Pain    HPI Lorelle GibbsChiquita Mehring is a 31 y.o. female.  HPI  31 y.o. female, presents to the Emergency Department today due to left flank pain since last night. No trauma to area or lifting injuries. Pt states she was doing PT yesterday for previous injury. States pain is constant and isolated to left flank. Mild radiation to left groin. Notes nausea without emesis. No dysuria. No hematuria. No polyuria. Noted hx kidney stones and states that it feels similar. Rates pain 8/10. Sharp sensation. Worse with movement. No fevers. Mild chills. No CP/SOB/ABD pain. No other symptoms noted.   Past Medical History:  Diagnosis Date  . ADHD (attention deficit hyperactivity disorder)   . Asthma     There are no active problems to display for this patient.   History reviewed. No pertinent surgical history.  OB History    No data available       Home Medications    Prior to Admission medications   Medication Sig Start Date End Date Taking? Authorizing Provider  cyclobenzaprine (FLEXERIL) 5 MG tablet Take 1 tablet (5 mg total) by mouth 3 (three) times daily as needed. Patient taking differently: Take 10 mg by mouth 3 (three) times daily as needed.  03/09/17  Yes Wallis BambergMani, Mario, PA-C  ibuprofen (ADVIL,MOTRIN) 200 MG tablet Take 200 mg by mouth every 6 (six) hours as needed.   Yes [provider]  naproxen sodium (ANAPROX) 220 MG tablet Take 440 mg by mouth 2 (two) times daily as needed.    Yes [provider]  HYDROcodone-acetaminophen (NORCO/VICODIN) 5-325 MG tablet Take 1-2 tablets by mouth every 6 (six) hours as needed. Patient not taking: Reported on 05/15/2017 05/10/17   Maxwell CaulLayden, Lindsey A, PA-C  predniSONE (DELTASONE) 20 MG tablet Take 2 tablets daily with breakfast. 04/25/17   Wallis BambergMani, Mario, PA-C    Family  History Family History  Problem Relation Age of Onset  . Hypertension Mother   . Heart disease Maternal Grandmother   . Hyperlipidemia Maternal Grandmother     Social History Social History  Substance Use Topics  . Smoking status: Current Every Day Smoker    Packs/day: 0.50    Years: 10.00    Types: Cigarettes  . Smokeless tobacco: Never Used  . Alcohol use 1.8 oz/week    1 Glasses of wine, 2 Cans of beer per week     Allergies   Patient has no known allergies.   Review of Systems Review of Systems ROS reviewed and all are negative for acute change except as noted in the HPI.  Physical Exam Updated Vital Signs BP 118/79 (BP Location: Left Arm)   Pulse (!) 104   Temp 98.8 F (37.1 C) (Oral)   Resp 18   LMP 05/05/2017   SpO2 100%   Physical Exam  Constitutional: She is oriented to person, place, and time. Vital signs are normal. She appears well-developed and well-nourished.  HENT:  Head: Normocephalic and atraumatic.  Right Ear: Hearing normal.  Left Ear: Hearing normal.  Eyes: Pupils are equal, round, and reactive to light. Conjunctivae and EOM are normal.  Neck: Normal range of motion. Neck supple.  Cardiovascular: Normal rate, regular rhythm, normal heart sounds and intact distal pulses.   Pulmonary/Chest: Effort normal and breath sounds normal.  Abdominal: Soft.  There is no tenderness.  Musculoskeletal: Normal range of motion.  Left flank TTP. No palpable or visible deformities.   Neurological: She is alert and oriented to person, place, and time.  Skin: Skin is warm and dry.  Psychiatric: She has a normal mood and affect. Her speech is normal and behavior is normal. Thought content normal.  Nursing note and vitals reviewed.    ED Treatments / Results  Labs (all labs ordered are listed, but only abnormal results are displayed) Labs Reviewed  URINALYSIS, ROUTINE W REFLEX MICROSCOPIC - Abnormal; Notable for the following:       Result Value    Leukocytes, UA MODERATE (*)    All other components within normal limits  URINALYSIS, MICROSCOPIC (REFLEX) - Abnormal; Notable for the following:    Bacteria, UA RARE (*)    Squamous Epithelial / LPF 0-5 (*)    All other components within normal limits  PREGNANCY, URINE  GC/CHLAMYDIA PROBE AMP ([place]) NOT [facility]    EKG  EKG Interpretation None       Radiology Ct Renal Stone Study  Result Date: 05/23/2017 CLINICAL DATA:  Acute left flank pain. EXAM: CT ABDOMEN AND PELVIS WITHOUT CONTRAST TECHNIQUE: Multidetector CT imaging of the abdomen and pelvis was performed following the standard protocol without IV contrast. COMPARISON:  CT scan of February 01, 2016. FINDINGS: Lower chest: No acute abnormality. Hepatobiliary: No focal liver abnormality is seen. No gallstones, gallbladder wall thickening, or biliary dilatation. Pancreas: Unremarkable. No pancreatic ductal dilatation or surrounding inflammatory changes. Spleen: Normal in size without focal abnormality. Adrenals/Urinary Tract: Adrenal glands are unremarkable. Kidneys are normal, without renal calculi, focal lesion, or hydronephrosis. Bladder is unremarkable. Stomach/Bowel: Stomach is within normal limits. Appendix appears normal. No evidence of bowel wall thickening, distention, or inflammatory changes. Vascular/Lymphatic: No significant vascular findings are present. No enlarged abdominal or pelvic lymph nodes. Reproductive: Uterus and bilateral adnexa are unremarkable. Other: Small fat containing periumbilical hernia is noted. No abdominopelvic ascites. Musculoskeletal: No acute or significant osseous findings. IMPRESSION: No significant abnormality seen in the abdomen or pelvis. Electronically Signed   By: Lupita RaiderJames  Green Jr, M.D.   On: 05/23/2017 19:58    Procedures Procedures (including critical care time)  Medications Ordered in ED Medications  metroNIDAZOLE (FLAGYL) tablet 500 mg (not administered)  cefTRIAXone (ROCEPHIN)  injection 500 mg (not administered)  azithromycin (ZITHROMAX) tablet 1,000 mg (not administered)  methocarbamol (ROBAXIN) tablet 750 mg (750 mg Oral Given 05/23/17 1915)  ibuprofen (ADVIL,MOTRIN) tablet 800 mg (800 mg Oral Given 05/23/17 1916)     Initial Impression / Assessment and Plan / ED Course  I have reviewed the triage vital signs and the nursing notes.  Pertinent labs & imaging results that were available during my care of the patient were reviewed by me and considered in my medical decision making (see chart for details).  Final Clinical Impressions(s) / ED Diagnoses  {I have reviewed and evaluated the relevant laboratory values. {I have reviewed and evaluated the relevant imaging studies.  {I have reviewed the relevant previous healthcare records.  {I obtained HPI from historian.   ED Course:  Assessment: Pt is a 31 y.o. female presents to the Emergency Department today due to left flank pain since last night. No trauma to area or lifting injuries. Pt states she was doing PT yesterday for previous injury. States pain is constant and isolated to left flank. Mild radiation to left groin. Notes nausea without emesis. No dysuria. No hematuria. No polyuria.  Noted hx kidney stones and states that it feels similar. Rates pain 8/10. Sharp sensation. Worse with movement. No fevers. Mild chills. No CP/SOB/ABD pain. On exam, pt in NAD. Nontoxic/nonseptic appearing. VSS. Afebrile. Lungs CTA. Heart RRR. Abdomen nontender soft. Left Flank TTP. UA shows possible UTI. Trich present. GC sent. Pt asymptomatic. Culture sent.. CT Renal unremarkable. Given flagyl and Rocephin/Azithro in ED. Plan is to DC home with follow up to PCP. Will treat as muscular in etiology. Pending Urine culture for possible UTI with treat prophylactically. At time of discharge, Patient is in no acute distress. Vital Signs are stable. Patient is able to ambulate. Patient able to tolerate PO.    Disposition/Plan: DC  Home Additional Verbal discharge instructions given and discussed with patient.  Pt Instructed to f/u with PCP in the next week for evaluation and treatment of symptoms. Return precautions given Pt acknowledges and agrees with plan  Supervising Physician Doug Sou, MD  Final diagnoses:  Trichimoniasis  Strain of lumbar region, initial encounter  Acute cystitis without hematuria    New Prescriptions New Prescriptions   No medications on file     Audry Pili, Cordelia Poche 05/23/17 2046    Doug Sou, MD 05/24/17 [phone]

## 2017-05-23 NOTE — ED Triage Notes (Signed)
Pt reports L flank pain since last night. Reports urinary frequency, urgency. Denies fever, v/d, hematuria, dysuria. Reports nausea.

## 2017-05-23 NOTE — ED Notes (Signed)
Water given to drink 

## 2017-05-23 NOTE — Discharge Instructions (Signed)
Please read and follow all provided instructions.  Your diagnoses today include:  1. Trichimoniasis   2. Strain of lumbar region, initial encounter   3. Acute cystitis without hematuria     Tests performed today include: Vital signs. See below for your results today.   Medications prescribed:  Take as prescribed   Home care instructions:  Follow any educational materials contained in this packet.  Follow-up instructions: Please follow-up with your primary care provider for further evaluation of symptoms and treatment   Return instructions:  Please return to the Emergency Department if you do not get better, if you get worse, or new symptoms OR  - Fever (temperature greater than 101.39F)  - Bleeding that does not stop with holding pressure to the area    -Severe pain (please note that you may be more sore the day after your accident)  - Chest Pain  - Difficulty breathing  - Severe nausea or vomiting  - Inability to tolerate food and liquids  - Passing out  - Skin becoming red around your wounds  - Change in mental status (confusion or lethargy)  - New numbness or weakness    Please return if you have any other emergent concerns.  Additional Information:  Your vital signs today were: BP 118/79 (BP Location: Left Arm)    Pulse (!) 104    Temp 98.8 F (37.1 C) (Oral)    Resp 18    LMP 05/05/2017    SpO2 100%  If your blood pressure (BP) was elevated above 135/85 this visit, please have this repeated by your doctor within one month. ---------------

## 2017-05-25 LAB — GC/CHLAMYDIA PROBE AMP (~~LOC~~) NOT AT ARMC
CHLAMYDIA, DNA PROBE: NEGATIVE
NEISSERIA GONORRHEA: NEGATIVE

## 2017-05-27 ENCOUNTER — Ambulatory Visit: Payer: BLUE CROSS/BLUE SHIELD | Attending: Family Medicine | Admitting: Physical Therapy

## 2017-05-27 ENCOUNTER — Encounter: Payer: Self-pay | Admitting: Physical Therapy

## 2017-05-27 DIAGNOSIS — R252 Cramp and spasm: Secondary | ICD-10-CM | POA: Diagnosis not present

## 2017-05-27 DIAGNOSIS — M542 Cervicalgia: Secondary | ICD-10-CM | POA: Diagnosis not present

## 2017-05-27 NOTE — Therapy (Addendum)
[facility] [address] [place], [place], [address] Phone: [phone]   Fax:  [phone]  Physical Therapy Treatment  Patient Details  Name: Amy Sexton MRN: [id number] Date of Birth: [birth year] Referring Provider: Bess Harvest  Encounter Date: 05/27/2017      PT End of Session - 05/27/17 1641    Visit Number 3   Date for PT Re-Evaluation 07/19/17   PT Start Time 1600   PT Stop Time 1641   PT Time Calculation (min) 41 min   Activity Tolerance Patient tolerated treatment well   Behavior During Therapy [facility] for tasks assessed/performed      Past Medical History:  Diagnosis Date  . ADHD (attention deficit hyperactivity disorder)   . Asthma     History reviewed. No pertinent surgical history.  There were no vitals filed for this visit.      Subjective Assessment - 05/27/17 1604    Subjective "Been all right, I can sleep more now" "I don't have pain pain, it is more of a burning sensation now"   Currently in Pain? Yes   Pain Score 5    Pain Location Back   Pain Orientation Left;Upper                         [facility] Adult PT Treatment/Exercise - 05/27/17 0001      Exercises   Exercises Lumbar     Neck Exercises: Machines for Strengthening   UBE (Upper Arm Bike) L 3 3 fwd/3 back   Cybex Row 20# 2 sets 10   Other Machines for Strengthening lat pull 20# 2 sets 10     Neck Exercises: Standing   Neck Retraction 15 reps  x2   Wall Push Ups 10 reps  x2   Other Standing Exercises 4# shruggs and rolls 15 times each   Other Standing Exercises Shoulder ext & rows red tband 2x10, Standing [facility] yellow ball.2 x10      Lumbar Exercises: Machines for Strengthening   Leg Press 20lb 2x10                  PT Short Term Goals - 05/18/17 1645      PT SHORT TERM GOAL #1   Title independent with initial HEP   Time 2   Period Weeks   Status New           PT Long Term Goals - 05/18/17  1646      PT LONG TERM GOAL #1   Title understand proper posture and body mechanics   Time 8   Period Weeks   Status New   Target Date 07/13/17     PT LONG TERM GOAL #2   Title decrease pain 50%   Time 8   Period Weeks   Status New   Target Date 07/13/17     PT LONG TERM GOAL #3   Title increase cervical ROM 25%   Time 8   Period Weeks   Status New   Target Date 07/13/17     PT LONG TERM GOAL #4   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    Rehab Potential Good   PT Frequency 2x / week   PT Duration 8 weeks   PT Treatment/Interventions ADLs/Self Care Home Management;Cryotherapy;Electrical Stimulation;Moist Heat;Traction;Ultrasound;Therapeutic activities;Therapeutic exercise;Neuromuscular re-education;Patient/family education;Manual techniques   PT Next Visit Plan assess response to ex and progress as tol      Patient will benefit from skilled therapeutic intervention in order to improve the following deficits and impairments:  Decreased strength, Postural dysfunction, Improper body mechanics, Pain, Increased muscle spasms, Decreased range of motion  Visit Diagnosis: Cervicalgia  Cramp and spasm     Problem List There are no active problems to display for this patient.  PHYSICAL THERAPY DISCHARGE SUMMARY  Visits from Start of Care: 3   Plan: Patient agrees to discharge.  Patient goals were not met. Patient is being discharged due to not returning since the last visit.  ?????      Scot Jun, PTA 05/27/2017, 4:46 PM  [address] [place], [place], [address] Phone: [phone]   Fax:  [phone]  Name: Linet Brash MRN: [id number] Date  of Birth: 04/26/1986

## 2017-05-28 ENCOUNTER — Ambulatory Visit: Payer: BLUE CROSS/BLUE SHIELD | Admitting: Physical Therapy

## 2017-05-28 DIAGNOSIS — M25642 Stiffness of left hand, not elsewhere classified: Secondary | ICD-10-CM | POA: Diagnosis not present

## 2017-05-28 DIAGNOSIS — M20012 Mallet finger of left finger(s): Secondary | ICD-10-CM | POA: Diagnosis not present

## 2017-05-28 DIAGNOSIS — M25542 Pain in joints of left hand: Secondary | ICD-10-CM | POA: Diagnosis not present

## 2017-05-28 DIAGNOSIS — M25342 Other instability, left hand: Secondary | ICD-10-CM | POA: Diagnosis not present

## 2017-05-29 ENCOUNTER — Encounter (INDEPENDENT_AMBULATORY_CARE_PROVIDER_SITE_OTHER): Payer: Self-pay | Admitting: Orthopaedic Surgery

## 2017-06-01 ENCOUNTER — Ambulatory Visit (INDEPENDENT_AMBULATORY_CARE_PROVIDER_SITE_OTHER): Payer: BLUE CROSS/BLUE SHIELD | Admitting: Orthopaedic Surgery

## 2017-06-01 DIAGNOSIS — M20012 Mallet finger of left finger(s): Secondary | ICD-10-CM

## 2017-06-01 NOTE — Progress Notes (Signed)
   Office Visit Note   Patient: Amy Sexton           Date of Birth: [birth year]           MRN: [id number] Visit Date: 06/01/2017              Requested by: Wallis BambergMani, Mario, PA-C [address] [place], [place] [address] PCP: Wallis BambergMani, Mario, PA-C   Assessment & Plan: Visit Diagnoses:  1. Mallet finger of left hand     Plan: Referral to a different hand therapy office was made today to see if we can give her a better fitting splint. Follow-up in 5 weeks for recheck.  Follow-Up Instructions: Return in about 5 weeks (around 07/06/2017).   Orders:  No orders of the defined types were placed in this encounter.  No orders of the defined types were placed in this encounter.     Procedures: No procedures performed   Clinical Data: No additional findings.   Subjective: Chief Complaint  Patient presents with  . Left Little Finger - Follow-up    Patient follows up today for her mallet finger. She is about 3 weeks now and she is had issues with the splint fitting her finger well. She denies any noncompliance.    Review of Systems   Objective: Vital Signs: LMP 05/05/2017   Physical Exam  Ortho Exam Exam of her left small finger is stable. Minimal swelling. Specialty Comments:  No specialty comments available.  Imaging: No results found.   PMFS History: There are no active problems to display for this patient.  Past Medical History:  Diagnosis Date  . ADHD (attention deficit hyperactivity disorder)   . Asthma     Family History  Problem Relation Age of Onset  . Hypertension Mother   . Heart disease Maternal Grandmother   . Hyperlipidemia Maternal Grandmother     No past surgical history on file. Social History   Occupational History  . Not on file.   Social History Main Topics  . Smoking status: Current Every Day Smoker    Packs/day: 0.50    Years: 10.00    Types: Cigarettes  . Smokeless tobacco: Never Used  . Alcohol use 1.8 oz/week    1 Glasses of  wine, 2 Cans of beer per week  . Drug use: No  . Sexual activity: Yes    Birth control/ protection: Condom

## 2017-06-03 ENCOUNTER — Ambulatory Visit: Payer: BLUE CROSS/BLUE SHIELD | Admitting: Physical Therapy

## 2017-06-03 DIAGNOSIS — M20012 Mallet finger of left finger(s): Secondary | ICD-10-CM | POA: Diagnosis not present

## 2017-06-11 DIAGNOSIS — M20012 Mallet finger of left finger(s): Secondary | ICD-10-CM | POA: Diagnosis not present

## 2017-06-23 DIAGNOSIS — M20012 Mallet finger of left finger(s): Secondary | ICD-10-CM | POA: Diagnosis not present

## 2017-07-06 ENCOUNTER — Ambulatory Visit (INDEPENDENT_AMBULATORY_CARE_PROVIDER_SITE_OTHER): Payer: BLUE CROSS/BLUE SHIELD | Admitting: Orthopaedic Surgery

## 2017-07-06 ENCOUNTER — Encounter (INDEPENDENT_AMBULATORY_CARE_PROVIDER_SITE_OTHER): Payer: Self-pay | Admitting: Orthopaedic Surgery

## 2017-07-06 DIAGNOSIS — M20012 Mallet finger of left finger(s): Secondary | ICD-10-CM | POA: Diagnosis not present

## 2017-07-06 NOTE — Progress Notes (Signed)
   Office Visit Note   Patient: Amy GibbsChiquita Sexton           Date of Birth: [birth year]           MRN: [id number] Visit Date: 07/06/2017              Requested by: Wallis BambergMani, Mario, PA-C [address] [place], [place] [address] PCP: Wallis BambergMani, Mario, PA-C   Assessment & Plan: Visit Diagnoses:  1. Mallet finger of left hand     Plan: Patient is doing well from the splinting. She may begin range of motion of the DIP joint on October 1 which would put her at 8 weeks. She may wean the splint at that time with hand therapy. Follow-up in 6 weeks for recheck.  Follow-Up Instructions: Return in about 6 weeks (around 08/17/2017).   Orders:  No orders of the defined types were placed in this encounter.  No orders of the defined types were placed in this encounter.     Procedures: No procedures performed   Clinical Data: No additional findings.   Subjective: Chief Complaint  Patient presents with  . Left Hand - Pain, Follow-up  . Left Little Finger - Pain, Follow-up    Patient is 5 weeks status post left small mallet finger. She has had continuous extension splinting for 5 weeks now. She is currently tolerating her splint much better. She is currently doing full duty with the splint.    Review of Systems   Objective: Vital Signs: There were no vitals taken for this visit.  Physical Exam  Ortho Exam Left small finger shows a well fitting extension splint. No significant swelling. She is not having any extensor lag. Specialty Comments:  No specialty comments available.  Imaging: No results found.   PMFS History: There are no active problems to display for this patient.  Past Medical History:  Diagnosis Date  . ADHD (attention deficit hyperactivity disorder)   . Asthma     Family History  Problem Relation Age of Onset  . Hypertension Mother   . Heart disease Maternal Grandmother   . Hyperlipidemia Maternal Grandmother     No past surgical history on file. Social History     Occupational History  . Not on file.   Social History Main Topics  . Smoking status: Current Every Day Smoker    Packs/day: 0.50    Years: 10.00    Types: Cigarettes  . Smokeless tobacco: Never Used  . Alcohol use 1.8 oz/week    1 Glasses of wine, 2 Cans of beer per week  . Drug use: No  . Sexual activity: Yes    Birth control/ protection: Condom

## 2017-07-08 DIAGNOSIS — M20012 Mallet finger of left finger(s): Secondary | ICD-10-CM | POA: Diagnosis not present

## 2017-07-16 DIAGNOSIS — F315 Bipolar disorder, current episode depressed, severe, with psychotic features: Secondary | ICD-10-CM | POA: Diagnosis not present

## 2017-07-17 DIAGNOSIS — F315 Bipolar disorder, current episode depressed, severe, with psychotic features: Secondary | ICD-10-CM | POA: Diagnosis not present

## 2017-08-05 DIAGNOSIS — M20012 Mallet finger of left finger(s): Secondary | ICD-10-CM | POA: Diagnosis not present

## 2017-08-11 DIAGNOSIS — M20012 Mallet finger of left finger(s): Secondary | ICD-10-CM | POA: Diagnosis not present

## 2017-08-14 DIAGNOSIS — F315 Bipolar disorder, current episode depressed, severe, with psychotic features: Secondary | ICD-10-CM | POA: Diagnosis not present

## 2017-08-17 ENCOUNTER — Ambulatory Visit (INDEPENDENT_AMBULATORY_CARE_PROVIDER_SITE_OTHER): Payer: Self-pay | Admitting: Orthopaedic Surgery

## 2017-08-18 DIAGNOSIS — M20012 Mallet finger of left finger(s): Secondary | ICD-10-CM | POA: Diagnosis not present

## 2017-08-25 DIAGNOSIS — M20012 Mallet finger of left finger(s): Secondary | ICD-10-CM | POA: Diagnosis not present

## 2017-08-27 ENCOUNTER — Emergency Department (HOSPITAL_COMMUNITY)
Admission: EM | Admit: 2017-08-27 | Discharge: 2017-08-27 | Disposition: A | Discharge status: Home / Self Care | Payer: BLUE CROSS/BLUE SHIELD | Attending: Emergency Medicine | Admitting: Emergency Medicine

## 2017-08-27 ENCOUNTER — Encounter (HOSPITAL_COMMUNITY): Payer: Self-pay

## 2017-08-27 ENCOUNTER — Emergency Department (HOSPITAL_COMMUNITY): Payer: BLUE CROSS/BLUE SHIELD

## 2017-08-27 DIAGNOSIS — J45909 Unspecified asthma, uncomplicated: Secondary | ICD-10-CM | POA: Diagnosis not present

## 2017-08-27 DIAGNOSIS — M25562 Pain in left knee: Secondary | ICD-10-CM | POA: Diagnosis not present

## 2017-08-27 DIAGNOSIS — F17210 Nicotine dependence, cigarettes, uncomplicated: Secondary | ICD-10-CM | POA: Diagnosis not present

## 2017-08-27 NOTE — ED Triage Notes (Signed)
Pt reports that she hit her L knee on the side of her bed on Sunday. She states that she had had no pain until yesterday and it has gotten progressively worse. She also reports that it is tender and hot to touch. Ambulatory. A&Ox4.

## 2017-08-27 NOTE — ED Provider Notes (Signed)
[facility]-EMERGENCY DEPT Provider Note   CSN: [id number] Arrival date & time: 08/27/17  1739     History   Chief Complaint Chief Complaint  Patient presents with  . Knee Pain    HPI Lorelle GibbsChiquita Rexrode is a 31 y.o. female who presents with left knee pain. Patient reports an approximate 3 days ago, she hit the left knee on the corner of her bed. Patient reports that yesterday she started having associated pain. Patient reports that she has been able ambulate but with worsening pain. She has not taken any medications for the pain. Patient reports a couple days ago, she felt like the knee was warm and erythematous but states that that has since resolved. She reports on ED arrival, there is no erythema or warmth. Patient denies any fevers, numbness/weakness of the leg.  The history is provided by the patient.    Past Medical History:  Diagnosis Date  . ADHD (attention deficit hyperactivity disorder)   . Asthma     There are no active problems to display for this patient.   History reviewed. No pertinent surgical history.  OB History    No data available       Home Medications    Prior to Admission medications   Medication Sig Start Date End Date Taking? Authorizing Provider  cyclobenzaprine (FLEXERIL) 5 MG tablet Take 1 tablet (5 mg total) by mouth 3 (three) times daily as needed. Patient taking differently: Take 10 mg by mouth 3 (three) times daily as needed.  03/09/17   Wallis BambergMani, Mario, PA-C  HYDROcodone-acetaminophen (NORCO/VICODIN) 5-325 MG tablet Take 1-2 tablets by mouth every 6 (six) hours as needed. Patient not taking: Reported on 06/01/2017 05/10/17   Graciella FreerLayden, Anjannette Gauger A, PA-C  ibuprofen (ADVIL,MOTRIN) 200 MG tablet Take 200 mg by mouth every 6 (six) hours as needed.    [provider]  methocarbamol (ROBAXIN) 500 MG tablet Take 1 tablet (500 mg total) by mouth 2 (two) times daily. 05/23/17   Audry PiliMohr, Tyler, PA-C  metroNIDAZOLE (FLAGYL) 500 MG tablet  Take 1 tablet (500 mg total) by mouth 2 (two) times daily. 05/23/17   Audry PiliMohr, Tyler, PA-C  naproxen sodium (ANAPROX) 220 MG tablet Take 440 mg by mouth 2 (two) times daily as needed.     [provider]  predniSONE (DELTASONE) 20 MG tablet Take 2 tablets daily with breakfast. 04/25/17   Wallis BambergMani, Mario, PA-C    Family History Family History  Problem Relation Age of Onset  . Hypertension Mother   . Heart disease Maternal Grandmother   . Hyperlipidemia Maternal Grandmother     Social History Social History  Substance Use Topics  . Smoking status: Current Every Day Smoker    Packs/day: 0.50    Years: 10.00    Types: Cigarettes  . Smokeless tobacco: Never Used  . Alcohol use 1.8 oz/week    1 Glasses of wine, 2 Cans of beer per week     Allergies   Patient has no known allergies.   Review of Systems Review of Systems  Constitutional: Negative for fever.  Skin: Positive for color change (Resolved).     Physical Exam Updated Vital Signs BP 123/86 (BP Location: Left Arm)   Pulse 84   Temp 98.7 F (37.1 C) (Oral)   Resp 18   Ht 5\' 3"  (1.6 m)   Wt 102.1 kg (225 lb)   LMP 08/20/2017   SpO2 97%   BMI 39.86 kg/m   Physical Exam  Constitutional:  She appears well-developed and well-nourished.  Sitting comfortably on examination table  HENT:  Head: Normocephalic and atraumatic.  Eyes: Conjunctivae and EOM are normal. Right eye exhibits no discharge. Left eye exhibits no discharge. No scleral icterus.  Cardiovascular:  Pulses:      Dorsalis pedis pulses are 2+ on the right side, and 2+ on the left side.  Pulmonary/Chest: Effort normal.  Musculoskeletal:  Tenderness palpation to the anterior aspect of the left knee. No deformity or crepitus noted. No overlying warmth, erythema, soft tissue swelling, ecchymosis. Full flexion and extension of knee intact without difficulty. No laxity noted on varus or valgus stress. Patient is able to hold the left lower extremity in  extension against gravity without difficulty. No tenderness palpation to the left calf. No tenderness palpation to bilateral ankles. No abnormalities of the right lower extremity. Bilateral lower extremity is her symmetric in appearance.  Neurological: She is alert.  Sensation intact along major nerve distributions of BLE  Skin: Skin is warm and dry.  No warmth or erythema noted to the anterior aspect of the left knee.  Psychiatric: She has a normal mood and affect. Her speech is normal and behavior is normal.  Nursing note and vitals reviewed.    ED Treatments / Results  Labs (all labs ordered are listed, but only abnormal results are displayed) Labs Reviewed - No data to display  EKG  EKG Interpretation None       Radiology Dg Knee Complete 4 Views Left  Result Date: 08/27/2017 CLINICAL DATA:  Left knee pain after waking this morning. No known injury. EXAM: LEFT KNEE - COMPLETE 4+ VIEW COMPARISON:  None. FINDINGS: No evidence of fracture, dislocation, or joint effusion. No evidence of arthropathy. Small sclerotic density in the metaphysis of the femur consistent with a bone island. Soft tissues are unremarkable. IMPRESSION: No acute osseous abnormality of the left knee. No fracture, joint effusion or malalignment. Electronically Signed   By: Tollie Eth M.D.   On: 08/27/2017 18:31    Procedures Procedures (including critical care time)  Medications Ordered in ED Medications - No data to display   Initial Impression / Assessment and Plan / ED Course  I have reviewed the triage vital signs and the nursing notes.  Pertinent labs & imaging results that were available during my care of the patient were reviewed by me and considered in my medical decision making (see chart for details).     31 year old female who presents with left knee pain. History of hitting it on the bed a few days ago. Patient states that a few days ago, it was warm and red but states that has since  improved. No histories of fevers. No history of diabetes. Patient is afebrile, non-toxic appearing, sitting comfortably on examination table. Vital signs reviewed and stable. Patient is neurovascularly intact. Consider contusion versus fracture versus dislocation versus sprain. X-rays ordered at triage. History/physical exam are not concerning for DVT. Patient does report that a few days ago the knee was warm and red but states that that has since resolved. There is no evidence of warmth or erythema on my exam today. The bilateral lower extremity's are symmetric in appearance. Do not suspect septic arthritis based on history/presentation.  X-rays reviewed. Negative for any acute fracture or dislocation. No evidence of joint effusion. Discussed results with patient. Patient has been able ambulate in the department without difficulty. We'll plan to put patient in a knee sleeve for support and stabilization. Conservative therapies discussed with  patient. RICE protocol discussed with patient. Encourage patient to follow-up with her primary care doctor in the next 24-48 hours for further evaluation. Strict return precautions discussed. Patient expresses understanding and agreement to plan.   Final Clinical Impressions(s) / ED Diagnoses   Final diagnoses:  Acute pain of left knee    New Prescriptions New Prescriptions   No medications on file     Rosana Hoes 08/27/17 Neldon Newport, MD 08/27/17 2351

## 2017-08-27 NOTE — Discharge Instructions (Signed)
You can take Tylenol or Ibuprofen as directed for pain. You can alternate Tylenol and Ibuprofen every 4 hours. If you take Tylenol at 1pm, then you can take Ibuprofen at 5pm. Then you can take Tylenol again at 9pm.   Follow the RICE (Rest, Ice, Compression, Elevation) protocol as directed.   Follow-up with her primary care doctor next 24-48 hours for further evaluation.  Return to the emergency department for any worsening pain, redness or swelling of the knee, fevers, numbness or weakness of the leg or any other worsening or concerning symptoms.

## 2017-09-01 ENCOUNTER — Ambulatory Visit (INDEPENDENT_AMBULATORY_CARE_PROVIDER_SITE_OTHER): Payer: Self-pay | Admitting: Orthopaedic Surgery

## 2017-09-03 DIAGNOSIS — M20012 Mallet finger of left finger(s): Secondary | ICD-10-CM | POA: Diagnosis not present

## 2017-09-30 DIAGNOSIS — M20012 Mallet finger of left finger(s): Secondary | ICD-10-CM | POA: Diagnosis not present

## 2017-10-12 ENCOUNTER — Ambulatory Visit (INDEPENDENT_AMBULATORY_CARE_PROVIDER_SITE_OTHER): Payer: BLUE CROSS/BLUE SHIELD | Admitting: Orthopaedic Surgery

## 2017-10-12 ENCOUNTER — Encounter (INDEPENDENT_AMBULATORY_CARE_PROVIDER_SITE_OTHER): Payer: Self-pay | Admitting: Orthopaedic Surgery

## 2017-10-12 DIAGNOSIS — M20012 Mallet finger of left finger(s): Secondary | ICD-10-CM

## 2017-10-12 NOTE — Progress Notes (Signed)
   Office Visit Note   Patient: Amy GibbsChiquita Tison           Date of Birth: [birth year]           MRN: [id number] Visit Date: 10/12/2017              Requested by: Wallis BambergMani, Mario, PA-C [address] [place], [place] [address] PCP: Wallis BambergMani, Mario, PA-C   Assessment & Plan: Visit Diagnoses:  1. Mallet finger of left hand     Plan: Patient has done well from the splinting.  At this point she may discontinue the splint and continue with hand therapy.  Questions encouraged and answered.  Released to full duty at this point.  Follow-up as needed.  Follow-Up Instructions: Return if symptoms worsen or fail to improve.   Orders:  No orders of the defined types were placed in this encounter.  No orders of the defined types were placed in this encounter.     Procedures: No procedures performed   Clinical Data: No additional findings.   Subjective: Chief Complaint  Patient presents with  . Left Little Finger - Pain    Patient is a 31 year old female follows up for her mallet finger.  She states that she took off her splint about 3 days shy of 8 weeks.  She mainly comes in today for reassurance.  She does have some stiffness of her small finger.    Review of Systems   Objective: Vital Signs: There were no vitals taken for this visit.  Physical Exam  Ortho Exam Small finger exam shows good full extension at the DIP joint.  She does have expected stiffness with flexion. Specialty Comments:  No specialty comments available.  Imaging: No results found.   PMFS History: There are no active problems to display for this patient.  Past Medical History:  Diagnosis Date  . ADHD (attention deficit hyperactivity disorder)   . Asthma     Family History  Problem Relation Age of Onset  . Hypertension Mother   . Heart disease Maternal Grandmother   . Hyperlipidemia Maternal Grandmother     History reviewed. No pertinent surgical history. Social History   Occupational History  . Not  on file  Tobacco Use  . Smoking status: Current Every Day Smoker    Packs/day: 0.50    Years: 10.00    Pack years: 5.00    Types: Cigarettes  . Smokeless tobacco: Never Used  Substance and Sexual Activity  . Alcohol use: Yes    Alcohol/week: 1.8 oz    Types: 1 Glasses of wine, 2 Cans of beer per week  . Drug use: No  . Sexual activity: Yes    Birth control/protection: Condom

## 2017-11-10 DIAGNOSIS — F315 Bipolar disorder, current episode depressed, severe, with psychotic features: Secondary | ICD-10-CM | POA: Diagnosis not present

## 2017-11-16 DIAGNOSIS — H9202 Otalgia, left ear: Secondary | ICD-10-CM | POA: Diagnosis not present

## 2017-12-07 DIAGNOSIS — F315 Bipolar disorder, current episode depressed, severe, with psychotic features: Secondary | ICD-10-CM | POA: Diagnosis not present

## 2018-01-29 DIAGNOSIS — F315 Bipolar disorder, current episode depressed, severe, with psychotic features: Secondary | ICD-10-CM | POA: Diagnosis not present

## 2018-02-01 DIAGNOSIS — F315 Bipolar disorder, current episode depressed, severe, with psychotic features: Secondary | ICD-10-CM | POA: Diagnosis not present

## 2018-03-26 DIAGNOSIS — F315 Bipolar disorder, current episode depressed, severe, with psychotic features: Secondary | ICD-10-CM | POA: Diagnosis not present

## 2018-05-14 DIAGNOSIS — F315 Bipolar disorder, current episode depressed, severe, with psychotic features: Secondary | ICD-10-CM | POA: Diagnosis not present

## 2018-05-20 DIAGNOSIS — F315 Bipolar disorder, current episode depressed, severe, with psychotic features: Secondary | ICD-10-CM | POA: Diagnosis not present

## 2018-06-19 DIAGNOSIS — M542 Cervicalgia: Secondary | ICD-10-CM | POA: Diagnosis not present

## 2018-06-19 DIAGNOSIS — M25512 Pain in left shoulder: Secondary | ICD-10-CM | POA: Diagnosis not present

## 2018-06-19 DIAGNOSIS — S46912A Strain of unspecified muscle, fascia and tendon at shoulder and upper arm level, left arm, initial encounter: Secondary | ICD-10-CM | POA: Diagnosis not present

## 2018-07-12 DIAGNOSIS — F315 Bipolar disorder, current episode depressed, severe, with psychotic features: Secondary | ICD-10-CM | POA: Diagnosis not present

## 2018-08-25 ENCOUNTER — Ambulatory Visit (INDEPENDENT_AMBULATORY_CARE_PROVIDER_SITE_OTHER): Payer: BLUE CROSS/BLUE SHIELD | Admitting: Family Medicine

## 2018-08-25 ENCOUNTER — Encounter: Payer: Self-pay | Admitting: Family Medicine

## 2018-08-25 VITALS — BP 130/90 | HR 90 | Temp 97.9°F | Ht 63.0 in | Wt 236.0 lb

## 2018-08-25 DIAGNOSIS — F315 Bipolar disorder, current episode depressed, severe, with psychotic features: Secondary | ICD-10-CM | POA: Diagnosis not present

## 2018-08-25 DIAGNOSIS — M778 Other enthesopathies, not elsewhere classified: Secondary | ICD-10-CM | POA: Diagnosis not present

## 2018-08-25 DIAGNOSIS — M79642 Pain in left hand: Secondary | ICD-10-CM

## 2018-08-25 DIAGNOSIS — Z7689 Persons encountering health services in other specified circumstances: Secondary | ICD-10-CM | POA: Diagnosis not present

## 2018-08-25 MED ORDER — NAPROXEN 500 MG PO TABS: 500.0000 mg | ORAL_TABLET | Freq: Two times a day (BID) | ORAL | 0 refills | Status: DC

## 2018-08-25 NOTE — Patient Instructions (Signed)
Use ice pack 1-2x/day, 10 min on then off Take naproxen 500mg  1 tab twice per day Wear wrist sleeve at work  Follow-up in 2-3 wks for physical, labs, PAP and follow-up on wrist/hand

## 2018-08-25 NOTE — Progress Notes (Signed)
Amy Sexton is a 32 y.o. female  Chief Complaint  Patient presents with  . Establish Care    pt wants to estabish care.  pt presents today with left hand pain (throbbing) at night while sleep pain gets worse. pt sts last pap over a year/half    HPI: Amy Sexton is a 32 y.o. female here to establish care with our office.   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 She has been a patient there for 1+ year.   Last CPE, labs: more than 1 year  Last PAP: 2017 or 2018 - pt is unsure of exact date or provider. [facility]  Med refills needed today: none  She complains of 1.5 wk h/o Lt hand "throbbing" that started when she woke up one morning.  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 No swelling.  She notes difficulty sleeping d/t pain. No numbness or tingling. She is not dropping things. She feels her grip is not as strong.    Past Medical History:  Diagnosis Date  . ADHD (attention deficit hyperactivity disorder)   . Asthma     No past surgical history on file.  Social History   Socioeconomic History  . Marital status: Single    Spouse name: Not on file  . Number of children: Not on file  . Years of education: Not on file  . Highest education level: Not on file  Occupational History  . Not on file  Social Needs  . Financial resource strain: Not on file  . Food insecurity:    Worry: Not on file    Inability: Not on file  . Transportation needs:    Medical: Not on file    Non-medical: Not on file  Tobacco Use  . Smoking status: Current Every Day Smoker    Packs/day: 0.50    Years: 10.00    Pack years: 5.00    Types: Cigarettes  . Smokeless tobacco: Never Used  Substance and Sexual Activity  . Alcohol use: Yes    Alcohol/week: 3.0 standard drinks    Types: 1 Glasses of wine, 2 Cans of  beer per week  . Drug use: No  . Sexual activity: Yes    Birth control/protection: Condom  Lifestyle  . Physical activity:    Days per week: Not on file    Minutes per session: Not on file  . Stress: Not on file  Relationships  . Social connections:    Talks on phone: Not on file    Gets together: Not on file    Attends religious service: Not on file    Active member of club or organization: Not on file    Attends meetings of clubs or organizations: Not on file    Relationship status: Not on file  . Intimate partner violence:    Fear of current or ex partner: Not on file    Emotionally abused: Not on file    Physically abused: Not on file    Forced sexual activity: Not on file  Other Topics Concern  . Not on file  Social History Narrative  . Not on file    Family History  Problem Relation Age of Onset  . Hypertension Mother   . Heart disease Maternal Grandmother   . Hyperlipidemia Maternal Grandmother  Immunization History  Administered Date(s) Administered  . Tdap 01/30/2013    Outpatient Encounter Medications as of 08/25/2018  Medication Sig  . ibuprofen (ADVIL,MOTRIN) 200 MG tablet Take 200 mg by mouth every 6 (six) hours as needed.  . naproxen (NAPROSYN) 500 MG tablet Take 500 mg by mouth 2 (two) times daily.  . naproxen sodium (ANAPROX) 220 MG tablet Take 440 mg by mouth 2 (two) times daily as needed.   . [DISCONTINUED] cyclobenzaprine (FLEXERIL) 5 MG tablet Take 1 tablet (5 mg total) by mouth 3 (three) times daily as needed. (Patient not taking: Reported on 08/25/2018)  . [DISCONTINUED] HYDROcodone-acetaminophen (NORCO/VICODIN) 5-325 MG tablet Take 1-2 tablets by mouth every 6 (six) hours as needed. (Patient not taking: Reported on 06/01/2017)  . [DISCONTINUED] methocarbamol (ROBAXIN) 500 MG tablet Take 1 tablet (500 mg total) by mouth 2 (two) times daily. (Patient not taking: Reported on 08/25/2018)  . [DISCONTINUED] metroNIDAZOLE (FLAGYL) 500 MG tablet Take 1  tablet (500 mg total) by mouth 2 (two) times daily. (Patient not taking: Reported on 08/25/2018)  . [DISCONTINUED] predniSONE (DELTASONE) 20 MG tablet Take 2 tablets daily with breakfast. (Patient not taking: Reported on 08/25/2018)   No facility-administered encounter medications on file as of 08/25/2018.      ROS: Gen: no fever, chills  Skin: no rash, itching ENT: no ear pain, ear drainage, nasal congestion, rhinorrhea, sinus pressure, sore throat Eyes: no blurry vision, double vision Resp: no cough, wheeze,SOB CV: no CP, palpitations, LE edema,  GI: no heartburn, n/v/d/c, abd pain GU: no dysuria, urgency, frequency, hematuria  MSK: + left hand pain; no myalgias, back pain Neuro: no dizziness, headache, weakness, vertigo Psych: no depression, anxiety, insomnia   No Known Allergies  BP 130/90 (BP Location: Left Arm, Patient Position: Sitting, Cuff Size: Large)   Pulse 90   Temp 97.9 F (36.6 C) (Oral)   Ht 5\' 3"  (1.6 m)   Wt 236 lb (107 kg)   LMP 08/21/2018   SpO2 95%   BMI 41.81 kg/m   Physical Exam  Constitutional: She is oriented to person, place, and time. She appears well-developed and well-nourished. No distress.  Obese   Neck: Neck supple. No thyromegaly present.  Cardiovascular: Normal rate, regular rhythm and normal heart sounds.  Pulmonary/Chest: Effort normal and breath sounds normal. No respiratory distress.  Musculoskeletal: Normal range of motion. She exhibits tenderness (TTP over dorsum of wrist and at base of MCP joints, pain with flexion of wrist). She exhibits no edema or deformity.  Neurological: She is alert and oriented to person, place, and time. She has normal reflexes. No cranial nerve deficit. She exhibits abnormal muscle tone.  Skin: Skin is warm and dry.  Psychiatric: She has a normal mood and affect. Her behavior is normal.     A/P:  1. Encounter to establish care with new doctor - due for CPE, labs, PAP  2. Tendonitis of wrist,  left 3. Left hand pain - ice x 10 min 2x/day Rx: - naproxen (NAPROSYN) 500 MG tablet; Take 1 tablet (500 mg total) by mouth 2 (two) times daily.  Dispense: 60 tablet; Refill: 0 - wrist sleeve while at work - f/u in 2-3 wks Discussed plan and reviewed medications with patient, including risks, benefits, and potential side effects. Pt expressed understand. All questions answered.

## 2018-09-01 DIAGNOSIS — F315 Bipolar disorder, current episode depressed, severe, with psychotic features: Secondary | ICD-10-CM | POA: Diagnosis not present

## 2018-09-08 ENCOUNTER — Other Ambulatory Visit (HOSPITAL_COMMUNITY)
Admission: RE | Admit: 2018-09-08 | Discharge: 2018-09-08 | Disposition: A | Discharge status: Home / Self Care | Payer: BLUE CROSS/BLUE SHIELD | Source: Ambulatory Visit | Attending: Family Medicine | Admitting: Family Medicine

## 2018-09-08 ENCOUNTER — Ambulatory Visit (INDEPENDENT_AMBULATORY_CARE_PROVIDER_SITE_OTHER): Payer: BLUE CROSS/BLUE SHIELD | Admitting: Family Medicine

## 2018-09-08 ENCOUNTER — Encounter: Payer: Self-pay | Admitting: Family Medicine

## 2018-09-08 VITALS — BP 126/80 | HR 84 | Temp 97.9°F | Ht 63.0 in | Wt 236.5 lb

## 2018-09-08 DIAGNOSIS — Z124 Encounter for screening for malignant neoplasm of cervix: Secondary | ICD-10-CM

## 2018-09-08 DIAGNOSIS — Z0000 Encounter for general adult medical examination without abnormal findings: Secondary | ICD-10-CM

## 2018-09-08 DIAGNOSIS — F17200 Nicotine dependence, unspecified, uncomplicated: Secondary | ICD-10-CM | POA: Diagnosis not present

## 2018-09-08 DIAGNOSIS — Z6841 Body Mass Index (BMI) 40.0 and over, adult: Secondary | ICD-10-CM

## 2018-09-08 NOTE — Progress Notes (Signed)
Amy GibbsChiquita Sexton is a 32 y.o. female  Chief Complaint  Patient presents with  . Annual Exam    HPI: Amy Sexton is a 32 y.o. female here for annual exam and PAP.  Since her last OV with me, she saw her therapist and psychiatrist who changed her medication. Pt is happy about this, as she was having side effects from previous med.  Her wrist pain has improved significantly since last OV. Flu vaccine UTD.  Last PAP: due today Last mammo: n/a Last Dexa: n/a Last colonoscopy: n/a  Diet/Exercise: no regular exercise; states her diet "varies" - she is not a big sweets person but is a [address] girl". She has down from 40-60oz per day to 60oz per week.   Med refills needed today? none  Last menstrual cycle: FDLMP - 08/20/18. No vaginal itching, discharge, odor. She is sexually active and uses condoms. She has not had a PAP in "years"  Past Medical History:  Diagnosis Date  . ADHD (attention deficit hyperactivity disorder)   . Asthma     History reviewed. No pertinent surgical history.  Social History   Socioeconomic History  . Marital status: Single    Spouse name: Not on file  . Number of children: Not on file  . Years of education: Not on file  . Highest education level: Not on file  Occupational History  . Not on file  Social Needs  . Financial resource strain: Not on file  . Food insecurity:    Worry: Not on file    Inability: Not on file  . Transportation needs:    Medical: Not on file    Non-medical: Not on file  Tobacco Use  . Smoking status: Current Every Day Smoker    Packs/day: 0.50    Years: 10.00    Pack years: 5.00    Types: Cigarettes  . Smokeless tobacco: Never Used  Substance and Sexual Activity  . Alcohol use: Yes    Alcohol/week: 3.0 standard drinks    Types: 1 Glasses of wine, 2 Cans of beer per week  . Drug use: No  . Sexual activity: Yes    Birth control/protection: Condom  Lifestyle  . Physical activity:    Days per week: Not  on file    Minutes per session: Not on file  . Stress: Not on file  Relationships  . Social connections:    Talks on phone: Not on file    Gets together: Not on file    Attends religious service: Not on file    Active member of club or organization: Not on file    Attends meetings of clubs or organizations: Not on file    Relationship status: Not on file  . Intimate partner violence:    Fear of current or ex partner: Not on file    Emotionally abused: Not on file    Physically abused: Not on file    Forced sexual activity: Not on file  Other Topics Concern  . Not on file  Social History Narrative  . Not on file    Family History  Problem Relation Age of Onset  . Hypertension Mother   . Heart disease Maternal Grandmother   . Hyperlipidemia Maternal Grandmother      Immunization History  Administered Date(s) Administered  . Influenza-Unspecified 08/04/2017, 07/21/2018  . Tdap 01/30/2013    Outpatient Encounter Medications as of 09/08/2018  Medication Sig  . ibuprofen (ADVIL,MOTRIN) 200 MG tablet Take 200 mg by  mouth every 6 (six) hours as needed.  . lurasidone (LATUDA) 20 MG TABS tablet Take 20 mg by mouth daily.  . naproxen (NAPROSYN) 500 MG tablet Take 1 tablet (500 mg total) by mouth 2 (two) times daily.  . naproxen sodium (ANAPROX) 220 MG tablet Take 440 mg by mouth 2 (two) times daily as needed.   . Oxcarbazepine (TRILEPTAL) 300 MG tablet Take 300 mg by mouth 2 (two) times daily.   No facility-administered encounter medications on file as of 09/08/2018.      ROS: Gen: no fever, chills  Skin: no rash, itching ENT: no ear pain, ear drainage, nasal congestion, rhinorrhea, sinus pressure, sore throat Eyes: no blurry vision, double vision Resp: no cough, wheeze,SOB Breast: no breast tenderness, no nipple discharge, no breast masses CV: no CP, palpitations, LE edema,  GI: no heartburn, n/v/d/c, abd pain GU: no dysuria, urgency, frequency, hematuria; no vaginal  itching, odor, discharge MSK: no joint pain, myalgias, back pain Neuro: no dizziness, headache, weakness, vertigo Psych: no depression, anxiety, insomnia   No Known Allergies  BP 126/80 (BP Location: Left Arm, Patient Position: Sitting, Cuff Size: Large)   Pulse 84   Temp 97.9 F (36.6 C) (Oral)   Ht 5\' 3"  (1.6 m)   Wt 236 lb 8 oz (107.3 kg)   LMP 08/21/2018   SpO2 99%   BMI 41.89 kg/m   Physical Exam  Constitutional: She is oriented to person, place, and time. She appears well-developed and well-nourished. No distress.  HENT:  Head: Normocephalic and atraumatic.  Right Ear: Tympanic membrane and ear canal normal.  Left Ear: Tympanic membrane and ear canal normal.  Nose: Nose normal.  Mouth/Throat: Oropharynx is clear and moist and mucous membranes are normal. Abnormal dentition (poor dentition and missing top front teeth).  Eyes: Pupils are equal, round, and reactive to light. EOM are normal.  Neck: Normal range of motion. No thyromegaly present.  Cardiovascular: Normal rate, regular rhythm, normal heart sounds and intact distal pulses.  No murmur heard. Pulmonary/Chest: Effort normal and breath sounds normal. No respiratory distress. She has no wheezes.  Abdominal: Soft. Bowel sounds are normal. She exhibits no distension and no mass. There is no tenderness.  Obese   Musculoskeletal: She exhibits no edema.  Lymphadenopathy:    She has no cervical adenopathy.  Neurological: She is alert and oriented to person, place, and time.  Skin: Skin is warm and dry.  Psychiatric: She has a normal mood and affect. Her behavior is normal.     A/P:  1. Annual physical exam - immunizations UTD - due for PAP, CBE today - stressed importance of regular CV exercise (walking) and consistent healthy diet - pt is not motivated to make changes at this time - needs dental and vision exams but pt declines at this time - ALT - AST - Basic metabolic panel - Lipid panel - CBC - next CPE  in 1 year   2. Screening for cervical cancer - Cytology - PAP  3. BMI 41 - stressed importance of regular CV exercise (walking) and consistent healthy diet - pt is not motivated to make changes at this time  4. Tobacco use disorder - counseled pt about risks of smoking including cancer, COPD and benefits of quitting - pt is not motivated to quit at this time

## 2018-09-09 LAB — CBC
HEMATOCRIT: 37.4 % (ref 36.0–46.0)
Hemoglobin: 11.9 g/dL — ABNORMAL LOW (ref 12.0–15.0)
MCHC: 31.8 g/dL (ref 30.0–36.0)
MCV: 70.6 fl — ABNORMAL LOW (ref 78.0–100.0)
Platelets: 197 10*3/uL (ref 150.0–400.0)
RBC: 5.3 Mil/uL — ABNORMAL HIGH (ref 3.87–5.11)
RDW: 16.7 % — AB (ref 11.5–15.5)
WBC: 7.3 10*3/uL (ref 4.0–10.5)

## 2018-09-09 LAB — BASIC METABOLIC PANEL
BUN: 8 mg/dL (ref 6–23)
CO2: 24 meq/L (ref 19–32)
CREATININE: 0.82 mg/dL (ref 0.40–1.20)
Calcium: 9.2 mg/dL (ref 8.4–10.5)
Chloride: 103 mEq/L (ref 96–112)
GFR: 102.95 mL/min (ref >60.00)
GLUCOSE: 71 mg/dL (ref 70–99)
POTASSIUM: 3.8 meq/L (ref 3.5–5.1)
Sodium: 138 mEq/L (ref 135–145)

## 2018-09-09 LAB — AST: AST: 17 U/L (ref 0–37)

## 2018-09-09 LAB — LIPID PANEL
CHOL/HDL RATIO: 4
Cholesterol: 177 mg/dL (ref 0–200)
HDL: 40.1 mg/dL (ref >39.00)
LDL CALC: 120 mg/dL — AB (ref 0–99)
NONHDL: 137.33
Triglycerides: 86 mg/dL (ref 0.0–149.0)
VLDL: 17.2 mg/dL (ref 0.0–40.0)

## 2018-09-09 LAB — ALT: ALT: 16 U/L (ref 0–35)

## 2018-09-10 ENCOUNTER — Encounter: Payer: Self-pay | Admitting: Family Medicine

## 2018-09-10 LAB — CYTOLOGY - PAP
Adequacy: ABSENT
Diagnosis: NEGATIVE
HPV: NOT DETECTED

## 2018-09-15 ENCOUNTER — Other Ambulatory Visit: Payer: Self-pay | Admitting: Family Medicine

## 2018-09-15 DIAGNOSIS — A5901 Trichomonal vulvovaginitis: Secondary | ICD-10-CM

## 2018-09-15 DIAGNOSIS — A599 Trichomoniasis, unspecified: Secondary | ICD-10-CM

## 2018-09-15 MED ORDER — METRONIDAZOLE 500 MG PO TABS: ORAL_TABLET | ORAL | 0 refills | Status: DC

## 2018-09-15 NOTE — Progress Notes (Signed)
Pt with trichomoniasis on PAP. Rx sent to pharm on file

## 2018-10-28 ENCOUNTER — Encounter: Payer: Self-pay | Admitting: Family Medicine

## 2018-10-28 ENCOUNTER — Ambulatory Visit (INDEPENDENT_AMBULATORY_CARE_PROVIDER_SITE_OTHER): Payer: BLUE CROSS/BLUE SHIELD | Admitting: Family Medicine

## 2018-10-28 VITALS — BP 122/86 | HR 82 | Temp 98.5°F | Ht 63.0 in | Wt 237.6 lb

## 2018-10-28 DIAGNOSIS — J101 Influenza due to other identified influenza virus with other respiratory manifestations: Secondary | ICD-10-CM | POA: Diagnosis not present

## 2018-10-28 DIAGNOSIS — M25512 Pain in left shoulder: Secondary | ICD-10-CM

## 2018-10-28 DIAGNOSIS — G8929 Other chronic pain: Secondary | ICD-10-CM

## 2018-10-28 DIAGNOSIS — R52 Pain, unspecified: Secondary | ICD-10-CM

## 2018-10-28 DIAGNOSIS — R509 Fever, unspecified: Secondary | ICD-10-CM | POA: Diagnosis not present

## 2018-10-28 LAB — POCT INFLUENZA A/B
Influenza A, POC: POSITIVE — AB
Influenza B, POC: NEGATIVE

## 2018-10-28 NOTE — Patient Instructions (Signed)
You have Influenza A You should drink plenty of fluids (water), rest, take tylenol or ibuprofen as needed, wash your hands/cover your mouth/ets Follow-up if symptoms worsen or do not improve in 7-10 days   Influenza, Adult Influenza, more commonly known as "the flu," is a viral infection that mainly affects the respiratory tract. The respiratory tract includes organs that help you breathe, such as the lungs, nose, and throat. The flu causes many symptoms similar to the common cold along with high fever and body aches. The flu spreads easily from person to person (is contagious). Getting a flu shot (influenza vaccination) every year is the best way to prevent the flu. What are the causes? This condition is caused by the influenza virus. You can get the virus by:  Breathing in droplets that are in the air from an infected person's cough or sneeze.  Touching something that has been exposed to the virus (has been contaminated) and then touching your mouth, nose, or eyes. What increases the risk? The following factors may make you more likely to get the flu:  Not washing or sanitizing your hands often.  Having close contact with many people during cold and flu season.  Touching your mouth, eyes, or nose without first washing or sanitizing your hands.  Not getting a yearly (annual) flu shot. You may have a higher risk for the flu, including serious problems such as a lung infection (pneumonia), if you:  Are older than 65.  Are pregnant.  Have a weakened disease-fighting system (immune system). You may have a weakened immune system if you: ? Have HIV or AIDS. ? Are undergoing chemotherapy. ? Are taking medicines that reduce (suppress) the activity of your immune system.  Have a long-term (chronic) illness, such as heart disease, kidney disease, diabetes, or lung disease.  Have a liver disorder.  Are severely overweight (morbidly obese).  Have anemia. This is a condition that affects  your red blood cells.  Have asthma. What are the signs or symptoms? Symptoms of this condition usually begin suddenly and last 4-14 days. They may include:  Fever and chills.  Headaches, body aches, or muscle aches.  Sore throat.  Cough.  Runny or stuffy (congested) nose.  Chest discomfort.  Poor appetite.  Weakness or fatigue.  Dizziness.  Nausea or vomiting. How is this diagnosed? This condition may be diagnosed based on:  Your symptoms and medical history.  A physical exam.  Swabbing your nose or throat and testing the fluid for the influenza virus. How is this treated? If the flu is diagnosed early, you can be treated with medicine that can help reduce how severe the illness is and how long it lasts (antiviral medicine). This may be given by mouth (orally) or through an IV. Taking care of yourself at home can help relieve symptoms. Your health care provider may recommend:  Taking over-the-counter medicines.  Drinking plenty of fluids. In many cases, the flu goes away on its own. If you have severe symptoms or complications, you may be treated in a hospital. Follow these instructions at home: Activity  Rest as needed and get plenty of sleep.  Stay home from work or school as told by your health care provider. Unless you are visiting your health care provider, avoid leaving home until your fever has been gone for 24 hours without taking medicine. Eating and drinking  Take an oral rehydration solution (ORS). This is a drink that is sold at pharmacies and retail stores.  Drink enough  fluid to keep your urine pale yellow.  Drink clear fluids in small amounts as you are able. Clear fluids include water, ice chips, diluted fruit juice, and low-calorie sports drinks.  Eat bland, easy-to-digest foods in small amounts as you are able. These foods include bananas, applesauce, rice, lean meats, toast, and crackers.  Avoid drinking fluids that contain a lot of sugar or  caffeine, such as energy drinks, regular sports drinks, and soda.  Avoid alcohol.  Avoid spicy or fatty foods. General instructions      Take over-the-counter and prescription medicines only as told by your health care provider.  Use a cool mist humidifier to add humidity to the air in your home. This can make it easier to breathe.  Cover your mouth and nose when you cough or sneeze.  Wash your hands with soap and water often, especially after you cough or sneeze. If soap and water are not available, use alcohol-based hand sanitizer.  Keep all follow-up visits as told by your health care provider. This is important. How is this prevented?   Get an annual flu shot. You may get the flu shot in late summer, fall, or winter. Ask your health care provider when you should get your flu shot.  Avoid contact with people who are sick during cold and flu season. This is generally fall and winter. Contact a health care provider if:  You develop new symptoms.  You have: ? Chest pain. ? Diarrhea. ? A fever.  Your cough gets worse.  You produce more mucus.  You feel nauseous or you vomit. Get help right away if:  You develop shortness of breath or difficulty breathing.  Your skin or nails turn a bluish color.  You have severe pain or stiffness in your neck.  You develop a sudden headache or sudden pain in your face or ear.  You cannot eat or drink without vomiting. Summary  Influenza, more commonly known as "the flu," is a viral infection that primarily affects your respiratory tract.  Symptoms of the flu usually begin suddenly and last 4-14 days.  Getting an annual flu shot is the best way to prevent getting the flu.  Stay home from work or school as told by your health care provider. Unless you are visiting your health care provider, avoid leaving home until your fever has been gone for 24 hours without taking medicine.  Keep all follow-up visits as told by your health  care provider. This is important. This information is not intended to replace advice given to you by your health care provider. Make sure you discuss any questions you have with your health care provider. Document Released: 10/10/2000 Document Revised: 03/31/2018 Document Reviewed: 03/31/2018 Elsevier Interactive Patient Education  2019 [employer]   Shoulder Exercises Ask your health care provider which exercises are safe for you. Do exercises exactly as told by your health care provider and adjust them as directed. It is normal to feel mild stretching, pulling, tightness, or discomfort as you do these exercises, but you should stop right away if you feel sudden pain or your pain gets worse.Do not begin these exercises until told by your health care provider. Range of Motion Exercises        These exercises warm up your muscles and joints and improve the movement and flexibility of your shoulder. These exercises also help to relieve pain, numbness, and tingling. These exercises involve stretching your injured shoulder directly. Exercise A: Pendulum 1. Stand near a wall or  a surface that you can hold onto for balance. 2. Bend at the waist and let your left / right arm hang straight down. Use your other arm to support you. Keep your back straight and do not lock your knees. 3. Relax your left / right arm and shoulder muscles, and move your hips and your trunk so your left / right arm swings freely. Your arm should swing because of the motion of your body, not because you are using your arm or shoulder muscles. 4. Keep moving your body so your arm swings in the following directions, as told by your health care provider: ? Side to side. ? Forward and backward. ? In clockwise and counterclockwise circles. 5. Continue each motion for __________ seconds, or for as long as told by your health care provider. 6. Slowly return to the starting position. Repeat __________ times. Complete this  exercise __________ times a day. Exercise B:Flexion, Standing 1. Stand and hold a broomstick, a cane, or a similar object. Place your hands a little more than shoulder-width apart on the object. Your left / right hand should be palm-up, and your other hand should be palm-down. 2. Keep your elbow straight and keep your shoulder muscles relaxed. Push the stick down with your healthy arm to raise your left / right arm in front of your body, and then over your head until you feel a stretch in your shoulder. ? Avoid shrugging your shoulder while you raise your arm. Keep your shoulder blade tucked down toward the middle of your back. 3. Hold for __________ seconds. 4. Slowly return to the starting position. Repeat __________ times. Complete this exercise __________ times a day. Exercise C: Abduction, Standing 1. Stand and hold a broomstick, a cane, or a similar object. Place your hands a little more than shoulder-width apart on the object. Your left / right hand should be palm-up, and your other hand should be palm-down. 2. While keeping your elbow straight and your shoulder muscles relaxed, push the stick across your body toward your left / right side. Raise your left / right arm to the side of your body and then over your head until you feel a stretch in your shoulder. ? Do not raise your arm above shoulder height, unless your health care provider tells you to do that. ? Avoid shrugging your shoulder while you raise your arm. Keep your shoulder blade tucked down toward the middle of your back. 3. Hold for __________ seconds. 4. Slowly return to the starting position. Repeat __________ times. Complete this exercise __________ times a day. Exercise D:Internal Rotation 1. Place your left / right hand behind your back, palm-up. 2. Use your other hand to dangle an exercise band, a towel, or a similar object over your shoulder. Grasp the band with your left / right hand so you are holding onto both  ends. 3. Gently pull up on the band until you feel a stretch in the front of your left / right shoulder. ? Avoid shrugging your shoulder while you raise your arm. Keep your shoulder blade tucked down toward the middle of your back. 4. Hold for __________ seconds. 5. Release the stretch by letting go of the band and lowering your hands. Repeat __________ times. Complete this exercise __________ times a day. Stretching Exercises  These exercises warm up your muscles and joints and improve the movement and flexibility of your shoulder. These exercises also help to relieve pain, numbness, and tingling. These exercises are done using your healthy  shoulder to help stretch the muscles of your injured shoulder. Exercise E: Research officer, political party (External Rotation and Abduction) 1. Stand in a doorway with one of your feet slightly in front of the other. This is called a staggered stance. If you cannot reach your forearms to the door frame, stand facing a corner of a room. 2. Choose one of the following positions as told by your health care provider: ? Place your hands and forearms on the door frame above your head. ? Place your hands and forearms on the door frame at the height of your head. ? Place your hands on the door frame at the height of your elbows. 3. Slowly move your weight onto your front foot until you feel a stretch across your chest and in the front of your shoulders. Keep your head and chest upright and keep your abdominal muscles tight. 4. Hold for __________ seconds. 5. To release the stretch, shift your weight to your back foot. Repeat __________ times. Complete this stretch __________ times a day. Exercise F:Extension, Standing 1. Stand and hold a broomstick, a cane, or a similar object behind your back. ? Your hands should be a little wider than shoulder-width apart. ? Your palms should face away from your back. 2. Keeping your elbows straight and keeping your shoulder muscles relaxed,  move the stick away from your body until you feel a stretch in your shoulder. ? Avoid shrugging your shoulders while you move the stick. Keep your shoulder blade tucked down toward the middle of your back. 3. Hold for __________ seconds. 4. Slowly return to the starting position. Repeat __________ times. Complete this exercise __________ times a day. Strengthening Exercises           These exercises build strength and endurance in your shoulder. Endurance is the ability to use your muscles for a long time, even after they get tired. Exercise G:External Rotation 1. Sit in a stable chair without armrests. 2. Secure an exercise band at elbow height on your left / right side. 3. Place a soft object, such as a folded towel or a small pillow, between your left / right upper arm and your body to move your elbow a few inches away (about 10 cm) from your side. 4. Hold the end of the band so it is tight and there is no slack. 5. Keeping your elbow pressed against the soft object, move your left / right forearm out, away from your abdomen. Keep your body steady so only your forearm moves. 6. Hold for __________ seconds. 7. Slowly return to the starting position. Repeat __________ times. Complete this exercise __________ times a day. Exercise H:Shoulder Abduction 1. Sit in a stable chair without armrests, or stand. 2. Hold a __________ weight in your left / right hand, or hold an exercise band with both hands. 3. Start with your arms straight down and your left / right palm facing in, toward your body. 4. Slowly lift your left / right hand out to your side. Do not lift your hand above shoulder height unless your health care provider tells you that this is safe. ? Keep your arms straight. ? Avoid shrugging your shoulder while you do this movement. Keep your shoulder blade tucked down toward the middle of your back. 5. Hold for __________ seconds. 6. Slowly lower your arm, and return to the  starting position. Repeat __________ times. Complete this exercise __________ times a day. Exercise I:Shoulder Extension 1. Sit in a stable chair without armrests,  or stand. 2. Secure an exercise band to a stable object in front of you where it is at shoulder height. 3. Hold one end of the exercise band in each hand. Your palms should face each other. 4. Straighten your elbows and lift your hands up to shoulder height. 5. Step back, away from the secured end of the exercise band, until the band is tight and there is no slack. 6. Squeeze your shoulder blades together as you pull your hands down to the sides of your thighs. Stop when your hands are straight down by your sides. Do not let your hands go behind your body. 7. Hold for __________ seconds. 8. Slowly return to the starting position. Repeat __________ times. Complete this exercise __________ times a day. Exercise J:Standing Shoulder Row 1. Sit in a stable chair without armrests, or stand. 2. Secure an exercise band to a stable object in front of you so it is at waist height. 3. Hold one end of the exercise band in each hand. Your palms should be in a thumbs-up position. 4. Bend each of your elbows to an "L" shape (about 90 degrees) and keep your upper arms at your sides. 5. Step back until the band is tight and there is no slack. 6. Slowly pull your elbows back behind you. 7. Hold for __________ seconds. 8. Slowly return to the starting position. Repeat __________ times. Complete this exercise __________ times a day. Exercise K:Shoulder Press-Ups 1. Sit in a stable chair that has armrests. Sit upright, with your feet flat on the floor. 2. Put your hands on the armrests so your elbows are bent and your fingers are pointing forward. Your hands should be about even with the sides of your body. 3. Push down on the armrests and use your arms to lift yourself off of the chair. Straighten your elbows and lift yourself up as much as you  comfortably can. ? Move your shoulder blades down, and avoid letting your shoulders move up toward your ears. ? Keep your feet on the ground. As you get stronger, your feet should support less of your body weight as you lift yourself up. 4. Hold for __________ seconds. 5. Slowly lower yourself back into the chair. Repeat __________ times. Complete this exercise __________ times a day. Exercise L: Wall Push-Ups 1. Stand so you are facing a stable wall. Your feet should be about one arm-length away from the wall. 2. Lean forward and place your palms on the wall at shoulder height. 3. Keep your feet flat on the floor as you bend your elbows and lean forward toward the wall. 4. Hold for __________ seconds. 5. Straighten your elbows to push yourself back to the starting position. Repeat __________ times. Complete this exercise __________ times a day. This information is not intended to replace advice given to you by your health care provider. Make sure you discuss any questions you have with your health care provider. Document Released: 08/27/2005 Document Revised: 02/16/2018 Document Reviewed: 06/24/2015 [employer] Interactive Patient Education  2019 [employer]

## 2018-10-28 NOTE — Progress Notes (Signed)
Amy GibbsChiquita Sexton is a 33 y.o. female  Chief Complaint  Patient presents with  . Shoulder Pain    left  . Cough    w/ congestion and vomit    HPI: Amy Sexton is a 33 y.o. female complains of 1.5-2wk h/o cough that is productive. She complains of body aches, fatigue, increased cough, lightheadedness x 3 days. She had temp of 101 3 days ago, afebrile since then. + chills. No SOB, wheeze, DOE. Pt states she has been drinking fluids (water, OJ). Pt has been taking naproxen for her shoulder. No other meds for above symptoms.  Pt also complains of 1 wk h/o Lt shoulder pain. She states she was unable to move shoulder d/t pain x 2 days but pain and ROM have improved. She is taking naproxen 500mg  BID, using heating pad at night which helps. No injury or trauma. Pt does have a h/o chronic Lt shoulder pain and has been seen multiple times in the past (2018) for it. She did course of PT for her back and shoulder. Overall she feels like her current symptoms are improving.   Past Medical History:  Diagnosis Date  . ADHD (attention deficit hyperactivity disorder)   . Asthma     No past surgical history on file.  Social History   Socioeconomic History  . Marital status: Single    Spouse name: Not on file  . Number of children: Not on file  . Years of education: Not on file  . Highest education level: Not on file  Occupational History  . Not on file  Social Needs  . Financial resource strain: Not on file  . Food insecurity:    Worry: Not on file    Inability: Not on file  . Transportation needs:    Medical: Not on file    Non-medical: Not on file  Tobacco Use  . Smoking status: Current Every Day Smoker    Packs/day: 0.50    Years: 10.00    Pack years: 5.00    Types: Cigarettes  . Smokeless tobacco: Never Used  Substance and Sexual Activity  . Alcohol use: Yes    Alcohol/week: 3.0 standard drinks    Types: 1 Glasses of wine, 2 Cans of beer per week  . Drug use: No  . Sexual  activity: Yes    Birth control/protection: Condom  Lifestyle  . Physical activity:    Days per week: Not on file    Minutes per session: Not on file  . Stress: Not on file  Relationships  . Social connections:    Talks on phone: Not on file    Gets together: Not on file    Attends religious service: Not on file    Active member of club or organization: Not on file    Attends meetings of clubs or organizations: Not on file    Relationship status: Not on file  . Intimate partner violence:    Fear of current or ex partner: Not on file    Emotionally abused: Not on file    Physically abused: Not on file    Forced sexual activity: Not on file  Other Topics Concern  . Not on file  Social History Narrative  . Not on file    Family History  Problem Relation Age of Onset  . Hypertension Mother   . Heart disease Maternal Grandmother   . Hyperlipidemia Maternal Grandmother      Immunization History  Administered Date(s) Administered  .  Influenza-Unspecified 08/04/2017, 07/21/2018  . Tdap 01/30/2013    Outpatient Encounter Medications as of 10/28/2018  Medication Sig  . ibuprofen (ADVIL,MOTRIN) 200 MG tablet Take 200 mg by mouth every 6 (six) hours as needed.  . lurasidone (LATUDA) 20 MG TABS tablet Take 20 mg by mouth daily.  . naproxen (NAPROSYN) 500 MG tablet Take 1 tablet (500 mg total) by mouth 2 (two) times daily.  . naproxen sodium (ANAPROX) 220 MG tablet Take 440 mg by mouth 2 (two) times daily as needed.   . Oxcarbazepine (TRILEPTAL) 300 MG tablet Take 300 mg by mouth 2 (two) times daily.  . metroNIDAZOLE (FLAGYL) 500 MG tablet Take 4 tabs (2gm) po x 1 (Patient not taking: Reported on 10/28/2018)   No facility-administered encounter medications on file as of 10/28/2018.      ROS: Gen: + fever, chills  Skin: no rash, itching ENT: no ear pain, ear drainage; some nasal congestion, rhinorrhea; no sinus pressure, sore throat Eyes: no blurry vision, double vision Resp: +  cough; no wheeze,SOB CV: no CP, palpitations, LE edema,  GI: no heartburn, n/v/d/c, abd pain GU: no dysuria, urgency, frequency, hematuria  MSK: + joint pain - Lt shoulder; + myalgias; no back pain Neuro: no dizziness, headache, weakness, vertigo Psych: no depression, anxiety, insomnia   No Known Allergies  BP 122/86   Pulse 82   Temp 98.5 F (36.9 C) (Oral)   Ht 5\' 3"  (1.6 m)   Wt 237 lb 9.6 oz (107.8 kg)   SpO2 98%   BMI 42.09 kg/m   Physical Exam  Constitutional: She is oriented to person, place, and time. She appears well-developed and well-nourished. No distress.  HENT:  Head: Normocephalic and atraumatic.  Right Ear: Tympanic membrane and ear canal normal.  Left Ear: Tympanic membrane and ear canal normal.  Nose: Rhinorrhea present. No mucosal edema. Right sinus exhibits no maxillary sinus tenderness and no frontal sinus tenderness. Left sinus exhibits no maxillary sinus tenderness and no frontal sinus tenderness.  Mouth/Throat: Oropharynx is clear and moist and mucous membranes are normal.  Neck: Normal range of motion.  Cardiovascular: Normal rate, regular rhythm and normal heart sounds.  Pulmonary/Chest: Effort normal and breath sounds normal. No respiratory distress. She has no wheezes. She has no rhonchi.  Musculoskeletal: Normal range of motion.        General: Tenderness (Lt shoulder TTP over posterior and lateral aspect, good ROM, normal strength) present.  Lymphadenopathy:    She has no cervical adenopathy.  Neurological: She is alert and oriented to person, place, and time. She displays normal reflexes. No cranial nerve deficit. She exhibits normal muscle tone.  Skin: Skin is warm and dry.  Psychiatric: She has a normal mood and affect. Her behavior is normal.     A/P:  1. Influenza A - POCT Influenza A/B - positive for Flu A - pt with symptoms x > 72 hrs so tamiflu not indicated - advised supportive care and f/u PRN - note given for work  2. Chronic  left shoulder pain - symptoms x 1 week, improving - cont with naproxen BID, heating pad - add exercises (included in AVS) - f/u if symptoms worsen or do not continue to improve  Discussed plan and reviewed medications with patient, including risks, benefits, and potential side effects. Pt expressed understand. All questions answered.

## 2018-11-08 ENCOUNTER — Encounter: Payer: Self-pay | Admitting: Family Medicine

## 2018-11-10 MED ORDER — PREDNISONE 20 MG PO TABS: 40.0000 mg | ORAL_TABLET | Freq: Every day | ORAL | 0 refills | Status: AC

## 2018-11-11 DIAGNOSIS — F315 Bipolar disorder, current episode depressed, severe, with psychotic features: Secondary | ICD-10-CM | POA: Diagnosis not present

## 2018-11-23 ENCOUNTER — Emergency Department (HOSPITAL_COMMUNITY)
Admission: EM | Admit: 2018-11-23 | Discharge: 2018-11-23 | Disposition: A | Discharge status: Home / Self Care | Payer: BLUE CROSS/BLUE SHIELD | Attending: Emergency Medicine | Admitting: Emergency Medicine

## 2018-11-23 ENCOUNTER — Emergency Department (HOSPITAL_COMMUNITY): Payer: BLUE CROSS/BLUE SHIELD

## 2018-11-23 ENCOUNTER — Encounter (HOSPITAL_COMMUNITY): Payer: Self-pay | Admitting: Obstetrics and Gynecology

## 2018-11-23 DIAGNOSIS — J45909 Unspecified asthma, uncomplicated: Secondary | ICD-10-CM | POA: Insufficient documentation

## 2018-11-23 DIAGNOSIS — F17210 Nicotine dependence, cigarettes, uncomplicated: Secondary | ICD-10-CM | POA: Insufficient documentation

## 2018-11-23 DIAGNOSIS — R0602 Shortness of breath: Secondary | ICD-10-CM | POA: Diagnosis not present

## 2018-11-23 DIAGNOSIS — R0789 Other chest pain: Secondary | ICD-10-CM | POA: Insufficient documentation

## 2018-11-23 DIAGNOSIS — R600 Localized edema: Secondary | ICD-10-CM | POA: Diagnosis not present

## 2018-11-23 DIAGNOSIS — M79662 Pain in left lower leg: Secondary | ICD-10-CM | POA: Insufficient documentation

## 2018-11-23 DIAGNOSIS — Z7901 Long term (current) use of anticoagulants: Secondary | ICD-10-CM | POA: Insufficient documentation

## 2018-11-23 DIAGNOSIS — Z79899 Other long term (current) drug therapy: Secondary | ICD-10-CM | POA: Insufficient documentation

## 2018-11-23 DIAGNOSIS — R079 Chest pain, unspecified: Secondary | ICD-10-CM | POA: Diagnosis not present

## 2018-11-23 DIAGNOSIS — R05 Cough: Secondary | ICD-10-CM | POA: Diagnosis not present

## 2018-11-23 LAB — CBC
HCT: 39.4 % (ref 36.0–46.0)
Hemoglobin: 11.8 g/dL — ABNORMAL LOW (ref 12.0–15.0)
MCH: 21.6 pg — ABNORMAL LOW (ref 26.0–34.0)
MCHC: 29.9 g/dL — AB (ref 30.0–36.0)
MCV: 72.2 fL — ABNORMAL LOW (ref 80.0–100.0)
NRBC: 0 % (ref 0.0–0.2)
PLATELETS: 237 10*3/uL (ref 150–400)
RBC: 5.46 MIL/uL — ABNORMAL HIGH (ref 3.87–5.11)
RDW: 17 % — AB (ref 11.5–15.5)
WBC: 9.5 10*3/uL (ref 4.0–10.5)

## 2018-11-23 LAB — BASIC METABOLIC PANEL
Anion gap: 6 (ref 5–15)
BUN: 10 mg/dL (ref 6–20)
CHLORIDE: 107 mmol/L (ref 98–111)
CO2: 25 mmol/L (ref 22–32)
CREATININE: 0.8 mg/dL (ref 0.44–1.00)
Calcium: 8.8 mg/dL — ABNORMAL LOW (ref 8.9–10.3)
Glucose, Bld: 79 mg/dL (ref 70–99)
Potassium: 4.1 mmol/L (ref 3.5–5.1)
SODIUM: 138 mmol/L (ref 135–145)

## 2018-11-23 LAB — I-STAT BETA HCG BLOOD, ED (NOT ORDERABLE): I-stat hCG, quantitative: <5 m[IU]/mL (ref <5)

## 2018-11-23 LAB — D-DIMER, QUANTITATIVE: D-Dimer, Quant: 3.22 ug/mL-FEU — ABNORMAL HIGH (ref 0.00–0.50)

## 2018-11-23 LAB — POCT I-STAT TROPONIN I
TROPONIN I, POC: 0 ng/mL (ref 0.00–0.08)
Troponin i, poc: 0 ng/mL (ref 0.00–0.08)

## 2018-11-23 MED ORDER — IOPAMIDOL (ISOVUE-370) INJECTION 76%
INTRAVENOUS | Status: AC
  Filled 2018-11-23: qty 100

## 2018-11-23 MED ORDER — SODIUM CHLORIDE 0.9 % IV BOLUS
1000.0000 mL | Freq: Once | INTRAVENOUS | Status: AC
  Administered 2018-11-23: 1000 mL via INTRAVENOUS

## 2018-11-23 MED ORDER — IOPAMIDOL (ISOVUE-370) INJECTION 76%
100.0000 mL | Freq: Once | INTRAVENOUS | Status: AC | PRN
  Administered 2018-11-23: 100 mL via INTRAVENOUS

## 2018-11-23 MED ORDER — ENOXAPARIN SODIUM 120 MG/0.8ML ~~LOC~~ SOLN
1.0000 mg/kg | Freq: Once | SUBCUTANEOUS | Status: AC
  Administered 2018-11-23: 110 mg via SUBCUTANEOUS
  Filled 2018-11-23: qty 0.73

## 2018-11-23 MED ORDER — SODIUM CHLORIDE 0.9% FLUSH: 3.0000 mL | Freq: Once | INTRAVENOUS | Status: DC

## 2018-11-23 MED ORDER — SODIUM CHLORIDE (PF) 0.9 % IJ SOLN
INTRAMUSCULAR | Status: AC
  Filled 2018-11-23: qty 50

## 2018-11-23 NOTE — ED Provider Notes (Signed)
[facility]-EMERGENCY DEPT Provider Note   CSN: [id number] Arrival date & time: 11/23/18  1255     History   Chief Complaint Chief Complaint  Patient presents with  . Chest Pain  . Hand Pain    swelling    HPI Amy Sexton is a 33 y.o. female presenting today for 1 week of chest pain.  Patient describes chest pain as a constant midsternal aching sensation worse with palpation and movement and improved with being still.  Patient denies injury or trauma.  Patient denies recent fever or illness, she denies history of shortness of breath on my examination.  Patient states that throughout her ER stay her pain has resolved and she is no longer having pain.  Patient has not taken any medication for her pain prior to arrival.  Additionally patient endorses left hand swelling without pain, states that this is been present for multiple weeks and denies any injury.  HPI  Past Medical History:  Diagnosis Date  . ADHD (attention deficit hyperactivity disorder)   . Asthma     Patient Active Problem List   Diagnosis Date Noted  . BMI 40.0-44.9, adult (HCC) 09/08/2018  . Tobacco use disorder 09/08/2018    History reviewed. No pertinent surgical history.   OB History   No obstetric history on file.      Home Medications    Prior to Admission medications   Medication Sig Start Date End Date Taking? Authorizing Provider  ibuprofen (ADVIL,MOTRIN) 200 MG tablet Take 200 mg by mouth every 6 (six) hours as needed.   Yes [provider]  lurasidone (LATUDA) 20 MG TABS tablet Take 20 mg by mouth daily.   Yes [provider]  naproxen sodium (ANAPROX) 220 MG tablet Take 440 mg by mouth 2 (two) times daily as needed.    Yes [provider]  Oxcarbazepine (TRILEPTAL) 300 MG tablet Take 300 mg by mouth 2 (two) times daily.   Yes [provider]  metroNIDAZOLE (FLAGYL) 500 MG tablet Take 4 tabs (2gm) po x 1 Patient not taking:  Reported on 10/28/2018 09/15/18   Overton Mamirigliano, Mary K, DO  naproxen (NAPROSYN) 500 MG tablet Take 1 tablet (500 mg total) by mouth 2 (two) times daily. Patient not taking: Reported on 11/23/2018 08/25/18   Overton Mamirigliano, Mary K, DO    Family History Family History  Problem Relation Age of Onset  . Hypertension Mother   . Heart disease Maternal Grandmother   . Hyperlipidemia Maternal Grandmother     Social History Social History   Tobacco Use  . Smoking status: Current Every Day Smoker    Packs/day: 0.50    Years: 10.00    Pack years: 5.00    Types: Cigarettes  . Smokeless tobacco: Never Used  Substance Use Topics  . Alcohol use: Yes    Alcohol/week: 3.0 standard drinks    Types: 1 Glasses of wine, 2 Cans of beer per week  . Drug use: No     Allergies   Patient has no known allergies.   Review of Systems Review of Systems  Constitutional: Negative.  Negative for chills, diaphoresis, fatigue and fever.  Eyes: Negative.  Negative for visual disturbance.  Respiratory: Negative.  Negative for cough and shortness of breath.   Cardiovascular: Positive for chest pain. Negative for leg swelling.  Gastrointestinal: Negative.  Negative for abdominal pain, diarrhea, nausea and vomiting.  Musculoskeletal: Negative.  Negative for arthralgias and myalgias.  Neurological: Negative.  Negative for dizziness,  syncope, weakness, light-headedness, numbness and headaches.  All other systems reviewed and are negative.  Physical Exam Updated Vital Signs BP 132/88 (BP Location: Left Arm)   Pulse 73   Temp 99.1 F (37.3 C) (Oral)   Resp 15   Ht 5\' 3"  (1.6 m)   Wt 110.2 kg   LMP 11/16/2018 (Approximate)   SpO2 100%   BMI 43.05 kg/m   Physical Exam Constitutional:      General: She is not in acute distress.    Appearance: She is well-developed. She is not ill-appearing or diaphoretic.  HENT:     Head: Normocephalic and atraumatic.     Right Ear: External ear normal.     Left Ear:  External ear normal.     Nose: Nose normal.     Mouth/Throat:     Lips: Pink.     Mouth: Mucous membranes are moist.     Pharynx: Oropharynx is clear. Uvula midline.  Eyes:     General: Vision grossly intact. Gaze aligned appropriately.     Extraocular Movements: Extraocular movements intact.     Conjunctiva/sclera: Conjunctivae normal.     Pupils: Pupils are equal, round, and reactive to light.  Neck:     Musculoskeletal: Full passive range of motion without pain, normal range of motion and neck supple.     Trachea: Trachea and phonation normal. No tracheal deviation.  Cardiovascular:     Rate and Rhythm: Normal rate and regular rhythm.     Pulses:          Radial pulses are 2+ on the right side and 2+ on the left side.       Dorsalis pedis pulses are 2+ on the right side and 2+ on the left side.       Posterior tibial pulses are 2+ on the right side and 2+ on the left side.     Heart sounds: Normal heart sounds.  Pulmonary:     Effort: Pulmonary effort is normal. No accessory muscle usage or respiratory distress.     Breath sounds: Normal breath sounds. No wheezing or rhonchi.  Chest:     Chest wall: Tenderness present. No deformity or crepitus.     Comments: Consistently reproducible tenderness to light palpation of the sternum. Abdominal:     General: Bowel sounds are normal.     Palpations: Abdomen is soft.     Tenderness: There is no abdominal tenderness. There is no guarding or rebound.  Musculoskeletal: Normal range of motion.     Right lower leg: She exhibits no tenderness. No edema.     Left lower leg: She exhibits tenderness. No edema.     Comments: Patient with tenderness of the left calf.  No swelling color change.  Patient states that she did not realize this area was tender prior to my evaluation.  No abnormality or swelling of the upper extremities present.  Full range of motion 5/5 strength.  No increased warmth or erythema.  No sign of injury.  Feet:     Right  foot:     Protective Sensation: 3 sites tested. 3 sites sensed.     Left foot:     Protective Sensation: 3 sites tested. 3 sites sensed.  Skin:    General: Skin is warm and dry.     Capillary Refill: Capillary refill takes less than 2 seconds.  Neurological:     General: No focal deficit present.     Mental Status: She is alert.  GCS: GCS eye subscore is 4. GCS verbal subscore is 5. GCS motor subscore is 6.     Comments: Speech is clear and goal oriented, follows commands Major Cranial nerves without deficit, no facial droop Normal strength in upper and lower extremities bilaterally including dorsiflexion and plantar flexion, strong and equal grip strength Sensation normal to light touch Moves extremities without ataxia, coordination intact Normal gait  Psychiatric:        Behavior: Behavior normal.    ED Treatments / Results  Labs (all labs ordered are listed, but only abnormal results are displayed) Labs Reviewed  BASIC METABOLIC PANEL - Abnormal; Notable for the following components:      Result Value   Calcium 8.8 (*)    All other components within normal limits  CBC - Abnormal; Notable for the following components:   RBC 5.46 (*)    Hemoglobin 11.8 (*)    MCV 72.2 (*)    MCH 21.6 (*)    MCHC 29.9 (*)    RDW 17.0 (*)    All other components within normal limits  D-DIMER, QUANTITATIVE (NOT AT [facility]) - Abnormal; Notable for the following components:   D-Dimer, Quant 3.22 (*)    All other components within normal limits  I-STAT TROPONIN, ED  I-STAT BETA HCG BLOOD, ED ([facility], [facility], [facility] ONLY)  POCT I-STAT TROPONIN I  I-STAT BETA HCG BLOOD, ED (NOT ORDERABLE)  I-STAT TROPONIN, ED  POCT I-STAT TROPONIN I    EKG EKG Interpretation  Date/Time:  Tuesday November 23 2018 13:11:49 EST Ventricular Rate:  91 PR Interval:    QRS Duration: 76 QT Interval:  328 QTC Calculation: 404 R Axis:   24 Text Interpretation:  Sinus rhythm Low voltage, precordial leads Borderline T wave  abnormalities No significant change since last tracing Confirmed by Gwyneth Sprout ([id number]) on 11/23/2018 7:25:23 PM   Radiology Dg Chest 2 View  Result Date: 11/23/2018 CLINICAL DATA:  Chest pain.  Shortness of breath.  Cough. EXAM: CHEST - 2 VIEW COMPARISON:  04/12/2017. FINDINGS: Mediastinum hilar structures normal. Heart size normal. Low lung volumes with mild bibasilar atelectasis. No alveolar infiltrate. No pleural effusion or pneumothorax. IMPRESSION: Low lung volumes with mild bibasilar atelectasis. Electronically Signed   By: Maisie Fus  Register   On: 11/23/2018 14:00   Ct Angio Chest Pe W And/or Wo Contrast  Result Date: 11/23/2018 CLINICAL DATA:  33 year old female with acute chest pain and shortness of breath for 1 week. Elevated D-dimer. EXAM: CT ANGIOGRAPHY CHEST WITH CONTRAST TECHNIQUE: Multidetector CT imaging of the chest was performed using the standard protocol during bolus administration of intravenous contrast. Multiplanar CT image reconstructions and MIPs were obtained to evaluate the vascular anatomy. CONTRAST:  ISOVUE-370 IOPAMIDOL (ISOVUE-370) INJECTION 76% COMPARISON:  11/23/2018 prior radiographs.  01/02/2015 chest CT FINDINGS: Cardiovascular: This is a technically adequate study but respiratory motion artifact slightly decreases sensitivity in the LOWER lungs. No pulmonary emboli are identified. Cardiomegaly noted. No evidence of thoracic aortic aneurysm or pericardial effusion. Mediastinum/Nodes: No enlarged mediastinal, hilar, or axillary lymph nodes. Thyroid gland, trachea, and esophagus demonstrate no significant findings. Lungs/Pleura: No airspace disease, consolidation, suspicious nodule, mass, pleural effusion or pneumothorax identified. A 4 mm RIGHT apical nodule (series 7: Image 23) is unchanged 2016. Upper Abdomen: No acute abnormality Musculoskeletal: No acute or suspicious bony abnormalities. Review of the MIP images confirms the above findings. IMPRESSION: 1. No  evidence of acute abnormality. No evidence of pulmonary emboli. 2. Cardiomegaly Electronically Signed   By:  Harmon PierJeffrey  Hu M.D.   On: 11/23/2018 20:57    Procedures Procedures (including critical care time)  Medications Ordered in ED Medications  sodium chloride flush (NS) 0.9 % injection 3 mL (has no administration in time range)  sodium chloride (PF) 0.9 % injection (has no administration in time range)  iopamidol (ISOVUE-370) 76 % injection (has no administration in time range)  enoxaparin (LOVENOX) injection 110 mg (has no administration in time range)  sodium chloride 0.9 % bolus 1,000 mL (0 mLs Intravenous Stopped 11/23/18 2125)  iopamidol (ISOVUE-370) 76 % injection 100 mL (100 mLs Intravenous Contrast Given 11/23/18 2033)     Initial Impression / Assessment and Plan / ED Course  I have reviewed the triage vital signs and the nursing notes.  Pertinent labs & imaging results that were available during my care of the patient were reviewed by me and considered in my medical decision making (see chart for details).    Patient presenting with 1 week of chest pain.  Pain is consistently reproducible with palpation of the chest wall.  Additionally patient with left calf pain without swelling.  Troponin negative Beta hCG negative CBC nonacute BMP nonacute Chest x-ray nonacute EKG without acute changes reviewed by Dr. Anitra LauthPlunkett --------------------- Delta troponin negative D-dimer ordered due to tenderness of the left calf and was positive, CTA ordered --------------- CTA Chest:  IMPRESSION:  1. No evidence of acute abnormality. No evidence of pulmonary  emboli.  2. Cardiomegaly  -------------------- Ultrasound of the left lower extremity ordered however this imaging is not available at our facility at this time. -------------- Case discussed with Dr. Anitra LauthPlunkett who agrees with giving patient Lovenox 1 mg/kg here today and scheduling [facility] of D[facility]. ------------- Lovenox given by nursing staff, patient informed of increased bleeding risk while on Lovenox and is agreeable to care plan and discharge at this time. --------- Suspect patient's 1 week of chest pain is musculoskeletal in nature today.  Do not suspect acute cardiopulmonary process due to patient's presentation, history, negative CT Angio, negative troponin x2.  Patient with equal pulses throughout, doubt dissection or aneurysm today.  Patient only with pain with palpation of the chest and has remained chest pain-free otherwise throughout my evaluations. Denies SOB.  At discharge patient is afebrile, not tachycardic, not hypoxic, not hypotensive, well-appearing and in no acute distress.  She is ambulatory without assistance or difficulty.  At this time there does not appear to be any evidence of an acute emergency medical condition and the patient appears stable for discharge with appropriate outpatient follow up. Diagnosis was discussed with patient who verbalizes understanding of care plan and is agreeable to discharge. I have discussed return precautions with patient and friend at bedside who verbalize understanding of return precautions. Patient strongly encouraged to follow-up with their scheduled US study tomorrow as well as with her PCP. All questions answered.  Patient's case rediscussed with Dr. Anitra LauthPlunkett who agrees with plan to discharge with follow-up.   Note: Portions of this report may have been transcribed using voice recognition software. Every effort was made to ensure accuracy; however, inadvertent computerized transcription errors may still be present.  Final Clinical Impressions(s) / ED Diagnoses   Final diagnoses:  Chest wall pain  Pain in left lower leg    ED Discharge Orders         Ordered    LE VENOUS     11/23/18 2127  Elizabeth Palau 11/23/18 2217    Gwyneth Sprout, MD 11/24/18 [phone]

## 2018-11-23 NOTE — Discharge Instructions (Addendum)
You have been diagnosed today with Chest pain and left lower leg pain.  At this time there does not appear to be the presence of an emergent medical condition, however there is always the potential for conditions to change. Please read and follow the below instructions.  Please return to the Emergency Department immediately for any new or worsening. Please be sure to follow up with your Primary Care Provider within one week regarding your visit today; please call their office to schedule an appointment even if you are feeling better for a follow-up visit. You have been given a blood thinning medication today called Lovenox to hold you over until you have ultrasound performed of your left lower extremity.  You are at high risk of bleeding after taking this medication, please avoid dangerous activities that would lead to injury.  If you have an injury please come to the emergency department immediately. You must go to [facility] tomorrow morning for your ultrasound of your left lower extremity to look for blood clots.  Get help right away if: Your chest pain gets worse. You have a cough that gets worse, or you cough up blood. You have severe pain in your abdomen. You faint. You have sudden, unexplained chest discomfort. You have sudden, unexplained discomfort in your arms, back, neck, or jaw. You have shortness of breath at any time. You suddenly start to sweat, or your skin gets clammy. You feel nausea or you vomit. You suddenly feel lightheaded or dizzy. You have severe weakness, or unexplained weakness or fatigue. Your heart begins to beat quickly, or it feels like it is skipping beats. Get help right away if: You have: New or increased pain, swelling, or redness in an arm or leg. Numbness or tingling in an arm or leg. Shortness of breath. Chest pain. A rapid or irregular heartbeat. A severe headache or confusion. A cut that will not stop bleeding. There is blood in your vomit, stool,  or urine. You have a serious fall or accident, or you hit your head. You feel light-headed or dizzy. You cough up blood.   Please read the additional information packets attached to your discharge summary.  Do not take your medicine if  develop an itchy rash, swelling in your mouth or lips, or difficulty breathing.

## 2018-11-23 NOTE — ED Notes (Signed)
Pt reports 2 week hx of intermittent throbbing pain in chest. Pt stated that she worked through it yesterday. Pt reports decreased pain since she checked into the ED.

## 2018-11-23 NOTE — ED Triage Notes (Signed)
Pt reports she has been having chest pain x1 week and SOB. Pt reports she has had some left hand swelling. Pt reports she feels SOB when she is walking. Pt is a poor historian and keeps changing her times during triage.  Pt denies HTN or diabetes

## 2018-11-24 ENCOUNTER — Encounter: Payer: Self-pay | Admitting: Family Medicine

## 2018-11-24 ENCOUNTER — Ambulatory Visit (HOSPITAL_COMMUNITY)
Admission: RE | Admit: 2018-11-24 | Discharge: 2018-11-24 | Disposition: A | Discharge status: Home / Self Care | Payer: BLUE CROSS/BLUE SHIELD | Source: Ambulatory Visit | Attending: Medical | Admitting: Medical

## 2018-11-24 ENCOUNTER — Telehealth: Payer: Self-pay

## 2018-11-24 DIAGNOSIS — M79609 Pain in unspecified limb: Secondary | ICD-10-CM | POA: Diagnosis not present

## 2018-11-24 DIAGNOSIS — R0602 Shortness of breath: Secondary | ICD-10-CM | POA: Insufficient documentation

## 2018-11-24 DIAGNOSIS — M79606 Pain in leg, unspecified: Secondary | ICD-10-CM | POA: Insufficient documentation

## 2018-11-24 NOTE — Progress Notes (Signed)
Left lower extremity veous duplex completed. Preliminary results in Chart review CV Proc. [employer], RVS 1/29.2020:8:58 AM

## 2018-11-24 NOTE — Telephone Encounter (Signed)
Copied from CRM [id number]. Topic: General - Other >> Nov 24, 2018 12:12 PM Wyonia Hough E wrote:  Reason for CRM: [employer] will be calling office about Pt going to the ER yesterday due to hospital telling Pt she couldn't go back to work until 01.01.2020/ please call and discuss FMLA

## 2018-11-25 ENCOUNTER — Ambulatory Visit (INDEPENDENT_AMBULATORY_CARE_PROVIDER_SITE_OTHER): Payer: BLUE CROSS/BLUE SHIELD | Admitting: Family Medicine

## 2018-11-25 ENCOUNTER — Encounter: Payer: Self-pay | Admitting: Family Medicine

## 2018-11-25 VITALS — BP 118/86 | HR 86 | Temp 98.1°F | Ht 63.0 in | Wt 238.2 lb

## 2018-11-25 DIAGNOSIS — R202 Paresthesia of skin: Secondary | ICD-10-CM

## 2018-11-25 DIAGNOSIS — M940 Chondrocostal junction syndrome [Tietze]: Secondary | ICD-10-CM

## 2018-11-25 NOTE — Telephone Encounter (Signed)
Talked to pt about this FMLA she has no information as of right now about this FMLA. Going to call back when she receives more information and gets the paper work.

## 2018-11-25 NOTE — Progress Notes (Signed)
Amy Sexton is a 33 y.o. female  Chief Complaint  Patient presents with  . Hospitalization Follow-up    chest pain hasn't resolved,legs when sitting go numb and hurt when walking long periods of time    HPI: Amy Sexton is a 33 y.o. female here for f/u after ER visit on 11/23/18 for chest pain that was diagnosed as MSK in nature. She had normal EKG, neg cardiac enzymes, d-dimer was positive but CTA chest was negative and LE doppler was also negative. She states pain has improved but it is still present. Pain with palpation, certain movements, deep breath. No injury. No increase in physical activity.   Pt also states her gets "pins and needles" in her legs when she sits still in a chair or sofa for more than 30 min. This resolves with position change or standing for a few minutes. No weakness. No back pain. No injury/trauma. No change in bowel/bladder function. Pt is sedentary and does not exercise.   Past Medical History:  Diagnosis Date  . ADHD (attention deficit hyperactivity disorder)   . Asthma     No past surgical history on file.  Social History   Socioeconomic History  . Marital status: Single    Spouse name: Not on file  . Number of children: Not on file  . Years of education: Not on file  . Highest education level: Not on file  Occupational History  . Not on file  Social Needs  . Financial resource strain: Not on file  . Food insecurity:    Worry: Not on file    Inability: Not on file  . Transportation needs:    Medical: Not on file    Non-medical: Not on file  Tobacco Use  . Smoking status: Current Every Day Smoker    Packs/day: 0.50    Years: 10.00    Pack years: 5.00    Types: Cigarettes  . Smokeless tobacco: Never Used  Substance and Sexual Activity  . Alcohol use: Yes    Alcohol/week: 3.0 standard drinks    Types: 1 Glasses of wine, 2 Cans of beer per week  . Drug use: No  . Sexual activity: Yes    Birth control/protection: Condom    Lifestyle  . Physical activity:    Days per week: Not on file    Minutes per session: Not on file  . Stress: Not on file  Relationships  . Social connections:    Talks on phone: Not on file    Gets together: Not on file    Attends religious service: Not on file    Active member of club or organization: Not on file    Attends meetings of clubs or organizations: Not on file    Relationship status: Not on file  . Intimate partner violence:    Fear of current or ex partner: Not on file    Emotionally abused: Not on file    Physically abused: Not on file    Forced sexual activity: Not on file  Other Topics Concern  . Not on file  Social History Narrative  . Not on file    Family History  Problem Relation Age of Onset  . Hypertension Mother   . Heart disease Maternal Grandmother   . Hyperlipidemia Maternal Grandmother      Immunization History  Administered Date(s) Administered  . Influenza-Unspecified 08/04/2017, 07/21/2018  . Tdap 01/30/2013    Outpatient Encounter Medications as of 11/25/2018  Medication Sig  .  ibuprofen (ADVIL,MOTRIN) 200 MG tablet Take 200 mg by mouth every 6 (six) hours as needed.  . lurasidone (LATUDA) 20 MG TABS tablet Take 20 mg by mouth daily.  . naproxen sodium (ANAPROX) 220 MG tablet Take 440 mg by mouth 2 (two) times daily as needed.   . Oxcarbazepine (TRILEPTAL) 300 MG tablet Take 300 mg by mouth 2 (two) times daily.  . metroNIDAZOLE (FLAGYL) 500 MG tablet Take 4 tabs (2gm) po x 1 (Patient not taking: Reported on 10/28/2018)  . naproxen (NAPROSYN) 500 MG tablet Take 1 tablet (500 mg total) by mouth 2 (two) times daily. (Patient not taking: Reported on 11/23/2018)   No facility-administered encounter medications on file as of 11/25/2018.      ROS: Gen: no fever, chills  Skin: no rash, itching ENT: no ear pain, ear drainage, nasal congestion, rhinorrhea, sinus pressure, sore throat Eyes: no blurry vision, double vision Resp: no cough,  wheeze,SOB CV: no palpitations, LE edema; + chest pain - see above HPI GI: no heartburn, n/v/d/c, abd pain GU: no dysuria, urgency, frequency, hematuria  MSK: no joint pain, myalgias, back pain Neuro: no dizziness, headache, weakness; + paresthesias in LE when seated for > 30 min Psych: no depression, anxiety, insomnia   No Known Allergies  BP 118/86   Pulse 86   Temp 98.1 F (36.7 C) (Oral)   Ht 5\' 3"  (1.6 m)   Wt 238 lb 3.2 oz (108 kg)   LMP 11/16/2018 (Approximate)   SpO2 96%   BMI 42.20 kg/m   Physical Exam  Constitutional: She is oriented to person, place, and time. She appears well-developed and well-nourished. No distress.  Cardiovascular: Normal rate, regular rhythm and normal heart sounds.  No murmur heard. Pulmonary/Chest: Effort normal and breath sounds normal. No respiratory distress. She has no wheezes. She has no rhonchi. Tenderness:  + TTP anterior chest midline over sternum and just lateral to it B/L Lt > Rt.  Musculoskeletal: Normal range of motion.  Neurological: She is alert and oriented to person, place, and time. She has normal reflexes. No cranial nerve deficit. Coordination normal.  Psychiatric: She has a normal mood and affect. Her behavior is normal.     A/P:  1. Costochondritis - discussed anatomy, possibly etiologies, symptoms, treatment with pt and included info in AVS - ice pack (can do ice then heat) at least 2x/day for 15 min - naproxen 500mg  1 tab BID with food x 7-10 days - note for work given for 1 week of light duty - f/u PRN Discussed plan and reviewed medications with patient, including risks, benefits, and potential side effects. Pt expressed understand. All questions answered.  2. Paresthesia of both lower extremities - only occurs if pt is seated for > 30 min and resolves quickly with position change of standing. Pt is obese with BMI 42.2 - discussed importance of regular exercise, weight loss - advised pt to start walking for 10  min daily with goal, over a few weeks, of 30-40 min daily or at least 4-5x/wk - f/u PRN if symptoms worsen or new ones develop

## 2018-11-25 NOTE — Patient Instructions (Signed)
Use ice pack/heating pad 2x/day for 10-15 min Take naproxen 1 tab twice per day with food x 7-10 days   Costochondritis Costochondritis is swelling and irritation (inflammation) of the tissue (cartilage) that connects your ribs to your breastbone (sternum). This causes pain in the front of your chest. Usually, the pain:  Starts gradually.  Is in more than one rib. This condition usually goes away on its own over time. Follow these instructions at home:  Do not do anything that makes your pain worse.  If directed, put ice on the painful area: ? Put ice in a plastic bag. ? Place a towel between your skin and the bag. ? Leave the ice on for 20 minutes, 2-3 times a day.  If directed, put heat on the affected area as often as told by your doctor. Use the heat source that your doctor tells you to use, such as a moist heat pack or a heating pad. ? Place a towel between your skin and the heat source. ? Leave the heat on for 20-30 minutes. ? Take off the heat if your skin turns bright red. This is very important if you cannot feel pain, heat, or cold. You may have a greater risk of getting burned.  Take over-the-counter and prescription medicines only as told by your doctor.  Return to your normal activities as told by your doctor. Ask your doctor what activities are safe for you.  Keep all follow-up visits as told by your doctor. This is important. Contact a doctor if:  You have chills or a fever.  Your pain does not go away or it gets worse.  You have a cough that does not go away. Get help right away if:  You are short of breath. This information is not intended to replace advice given to you by your health care provider. Make sure you discuss any questions you have with your health care provider. Document Released: 03/31/2008 Document Revised: 05/02/2016 Document Reviewed: 02/06/2016 Elsevier Interactive Patient Education  2019 [employer].

## 2019-05-29 ENCOUNTER — Encounter: Payer: Self-pay | Admitting: Family Medicine

## 2019-06-01 ENCOUNTER — Telehealth: Payer: Self-pay

## 2019-06-01 ENCOUNTER — Ambulatory Visit (INDEPENDENT_AMBULATORY_CARE_PROVIDER_SITE_OTHER): Payer: Self-pay | Admitting: Family Medicine

## 2019-06-01 ENCOUNTER — Other Ambulatory Visit: Payer: Self-pay

## 2019-06-01 ENCOUNTER — Ambulatory Visit (INDEPENDENT_AMBULATORY_CARE_PROVIDER_SITE_OTHER): Payer: Self-pay

## 2019-06-01 ENCOUNTER — Encounter: Payer: Self-pay | Admitting: Family Medicine

## 2019-06-01 VITALS — Wt 215.0 lb

## 2019-06-01 DIAGNOSIS — M2550 Pain in unspecified joint: Secondary | ICD-10-CM

## 2019-06-01 DIAGNOSIS — M62838 Other muscle spasm: Secondary | ICD-10-CM

## 2019-06-01 MED ORDER — NAPROXEN 500 MG PO TABS: 500.0000 mg | ORAL_TABLET | Freq: Two times a day (BID) | ORAL | 1 refills | Status: DC

## 2019-06-01 MED ORDER — TIZANIDINE HCL 4 MG PO CAPS: 4.0000 mg | ORAL_CAPSULE | Freq: Every evening | ORAL | 0 refills | Status: DC | PRN

## 2019-06-01 NOTE — Telephone Encounter (Signed)
Called patient this morning. Left message for patient to call back and schedule VV with Dr. Loletha Grayer.

## 2019-06-01 NOTE — Progress Notes (Signed)
Virtual Visit via Video Note  I connected with Amy Gibbshiquita Scheu on 06/01/19 at 11:00 AM EDT by a video enabled telemedicine application and verified that I am speaking with the correct person using two identifiers. Location patient: home Location provider: home office Persons participating in the virtual visit: patient, provider  I discussed the limitations of evaluation and management by telemedicine and the availability of in person appointments. The patient expressed understanding and agreed to proceed.  Chief Complaint  Patient presents with  . Shoulder Pain    Pt agrees to virtual visit.  She is C/O left shoulder pain. The pain started 3-weeks-ago but was intermittent.  As of last Wednesday it became persistent without breaks in pain. Does not have full ROM. Denies any injury but states that it has actually been going on for a couple years. It radiates up into neck.  . Hand Pain    She is also C/O bilateral hand pain that is worse in the left. Sx started 2-weeks-ago with intermittent pain and edema.  When she works for 2 days in a row it swells back up again. Denies any injury. Does have full ROM. Her palms and knuckles swell and hurt the most. Friday she picked up a box at work and dropped it because her left arm couldn't hold it and her hands were swollen then. She was told that she would need a note in order to return to work.     HPI: Amy Sexton is a 33 y.o. female who complains of Lt shoulder pain that is chronic, intermittent in nature. Current flare began about 3 wks ago and in the past week pt endorses persistent pain and decreased ROM secondary to pain. No UE weakness, no decreased grip strength. No numbness or tingling in LUE. Pt does have neck pain on Lt side. She feels like she "has knots in her neck"  Pt also complains of B/L hand pain, Lt > Rt. Symptoms x 2 weeks. Pain is intermittent. She also endorses intermittent swelling which contributes to pain and difficulty holding  heavy things. Pt dropped a box at work 5 days ago. She needs a note in order to return to work.  Pt has had chronic neck, upper back, shoulder, wrist, hand pain x years. She has done a few courses of PT in the past. Her job is physical with repetitive motion of hand/wrist.   Past Medical History:  Diagnosis Date  . ADHD (attention deficit hyperactivity disorder)   . Asthma     No past surgical history on file.  Family History  Problem Relation Age of Onset  . Hypertension Mother   . Heart disease Maternal Grandmother   . Hyperlipidemia Maternal Grandmother     Social History   Tobacco Use  . Smoking status: Current Every Day Smoker    Packs/day: 0.50    Years: 10.00    Pack years: 5.00    Types: Cigarettes  . Smokeless tobacco: Never Used  Substance Use Topics  . Alcohol use: Yes    Alcohol/week: 3.0 standard drinks    Types: 1 Glasses of wine, 2 Cans of beer per week  . Drug use: No     Current Outpatient Medications:  .  ARIPiprazole (ABILIFY) 10 MG tablet, Take 10 mg by mouth daily., Disp: , Rfl:  .  ibuprofen (ADVIL,MOTRIN) 200 MG tablet, Take 200 mg by mouth every 6 (six) hours as needed., Disp: , Rfl:  .  Oxcarbazepine (TRILEPTAL) 300 MG tablet, Take 300 mg  by mouth 2 (two) times daily., Disp: , Rfl:  .  naproxen sodium (ANAPROX) 220 MG tablet, Take 440 mg by mouth 2 (two) times daily as needed. , Disp: , Rfl:   No Known Allergies    ROS: See pertinent positives and negatives per HPI.   EXAM:  VITALS per patient if applicable: Wt 215 lb (97.5 kg)   LMP 05/31/2019   BMI 38.09 kg/m    GENERAL: alert, oriented, appears well and in no acute distress  HEENT: atraumatic, conjunctiva clear, no obvious abnormalities on inspection of external nose and ears  NECK: normal movements of the head and neck  LUNGS: on inspection no signs of respiratory distress, breathing rate appears normal, no obvious gross SOB, gasping or wheezing, no conversational  dyspnea  CV: no obvious cyanosis  MS: moves all visible extremities without noticeable abnormality  PSYCH/NEURO: pleasant and cooperative, no obvious depression or anxiety, speech and thought processing grossly intact   ASSESSMENT AND PLAN:  1. Pain in joint involving multiple sites - ANA; Future - C-reactive protein; Future - Sedimentation rate; Future - Rheumatoid factor; Future - DG Shoulder Left; Future - DG Hand Complete Left; Future Rx: - naproxen (NAPROSYN) 500 MG tablet; Take 1 tablet (500 mg total) by mouth 2 (two) times daily with a meal.  Dispense: 60 tablet; Refill: 1  2. Muscle spasms of neck - cont with heating pad BID, stretching exercises Rx: - tiZANidine (ZANAFLEX) 4 MG capsule; Take 1 capsule (4 mg total) by mouth at bedtime as needed for muscle spasms.  Dispense: 30 capsule; Refill: 0 - naproxen (NAPROSYN) 500 MG tablet; Take 1 tablet (500 mg total) by mouth 2 (two) times daily with a meal.  Dispense: 60 tablet; Refill: 1   I discussed the assessment and treatment plan with the patient. The patient was provided an opportunity to ask questions and all were answered. The patient agreed with the plan and demonstrated an understanding of the instructions.   The patient was advised to call back or seek an in-person evaluation if the symptoms worsen or if the condition fails to improve as anticipated.   Letta Median, DO

## 2019-06-01 NOTE — Addendum Note (Signed)
Addended by: Lynnea Ferrier on: 06/01/2019 01:06 PM   Modules accepted: Orders

## 2019-06-01 NOTE — Telephone Encounter (Signed)
Questions for Screening COVID-19  Symptom onset: None  Travel or Contacts: None  During this illness, did/does the patient experience any of the following symptoms? Fever >100.83F []   Yes [x]   No []   Unknown Subjective fever (felt feverish) []   Yes [x]   No []   Unknown Chills []   Yes [x]   No []   Unknown Muscle aches (myalgia) []   Yes [x]   No []   Unknown Runny nose (rhinorrhea) []   Yes [x]   No []   Unknown Sore throat []   Yes [x]   No []   Unknown Cough (new onset or worsening of chronic cough) []   Yes [x]   No []   Unknown Shortness of breath (dyspnea) []   Yes [x]   No []   Unknown Nausea or vomiting []   Yes [x]   No []   Unknown Headache []   Yes [x]   No []   Unknown Abdominal pain  []   Yes [x]   No []   Unknown Diarrhea (?3 loose/looser than normal stools/24hr period) []   Yes [x]   No []   Unknown Other, specify:  Patient risk factors: Smoker? []   Current []   Former []   Never If female, currently pregnant? []   Yes []   No  Patient Active Problem List   Diagnosis Date Noted  . BMI 40.0-44.9, adult ([facility]) 09/08/2018  . Tobacco use disorder 09/08/2018    Plan:  []   High risk for COVID-19 with red flags go to ED (with CP, SOB, weak/lightheaded, or fever > 101.5). Call ahead.  []   High risk for COVID-19 but stable. Inform provider and coordinate time for [facility] visit.   []   No red flags but URI signs or symptoms okay for [facility] visit.

## 2019-06-02 LAB — SEDIMENTATION RATE: Sed Rate: 39 mm/hr — ABNORMAL HIGH (ref 0–32)

## 2019-06-02 LAB — ANA: Anti Nuclear Antibody (ANA): NEGATIVE

## 2019-06-02 LAB — C-REACTIVE PROTEIN: CRP: 14 mg/L — ABNORMAL HIGH (ref 0–10)

## 2019-06-02 LAB — RHEUMATOID FACTOR: Rheumatoid fact SerPl-aCnc: <10 IU/mL (ref 0.0–13.9)

## 2019-06-03 ENCOUNTER — Encounter: Payer: Self-pay | Admitting: Family Medicine

## 2019-06-04 ENCOUNTER — Encounter (HOSPITAL_BASED_OUTPATIENT_CLINIC_OR_DEPARTMENT_OTHER): Payer: Self-pay | Admitting: Adult Health

## 2019-06-04 ENCOUNTER — Other Ambulatory Visit: Payer: Self-pay

## 2019-06-04 ENCOUNTER — Emergency Department (HOSPITAL_BASED_OUTPATIENT_CLINIC_OR_DEPARTMENT_OTHER)
Admission: EM | Admit: 2019-06-04 | Discharge: 2019-06-04 | Disposition: A | Discharge status: Home / Self Care | Payer: Self-pay | Attending: Emergency Medicine | Admitting: Emergency Medicine

## 2019-06-04 DIAGNOSIS — H6122 Impacted cerumen, left ear: Secondary | ICD-10-CM | POA: Insufficient documentation

## 2019-06-04 DIAGNOSIS — H9202 Otalgia, left ear: Secondary | ICD-10-CM

## 2019-06-04 DIAGNOSIS — H6502 Acute serous otitis media, left ear: Secondary | ICD-10-CM | POA: Insufficient documentation

## 2019-06-04 DIAGNOSIS — J45909 Unspecified asthma, uncomplicated: Secondary | ICD-10-CM | POA: Insufficient documentation

## 2019-06-04 DIAGNOSIS — F17210 Nicotine dependence, cigarettes, uncomplicated: Secondary | ICD-10-CM | POA: Insufficient documentation

## 2019-06-04 DIAGNOSIS — Z79899 Other long term (current) drug therapy: Secondary | ICD-10-CM | POA: Insufficient documentation

## 2019-06-04 MED ORDER — AMOXICILLIN-POT CLAVULANATE 875-125 MG PO TABS: 1.0000 | ORAL_TABLET | Freq: Two times a day (BID) | ORAL | 0 refills | Status: AC

## 2019-06-04 MED ORDER — DOCUSATE SODIUM 50 MG/5ML PO LIQD
50.0000 mg | Freq: Once | ORAL | Status: AC
  Administered 2019-06-04: 50 mg via ORAL
  Filled 2019-06-04: qty 10

## 2019-06-04 MED ORDER — AMOXICILLIN-POT CLAVULANATE 875-125 MG PO TABS: 1.0000 | ORAL_TABLET | Freq: Two times a day (BID) | ORAL | 0 refills | Status: DC

## 2019-06-04 NOTE — ED Provider Notes (Signed)
[facility] EMERGENCY DEPARTMENT Provider Note   CSN: [id number] Arrival date & time: 06/04/19  1655    History   Chief Complaint Chief Complaint  Patient presents with  . Otalgia    HPI Amy Sexton is a 33 y.o. female.     33 y.o female with a PMH of ADHD, Asthma presents to the ED with a chief complaint of left ear pain x 3 days.  Patient reports she was cleaning her ears earlier this week with some peroxide along with Q-tips, reports she then noted pain to her left ear, now has proceeded to worsen as she reports decreased hearing on the left ear.  She has tried some peroxide, Q-tips, over-the-counter cleaning regimen without improvement in symptoms.  She denies any dizziness, headache, fevers or URI symptoms.  The history is provided by the patient.  Otalgia Associated symptoms: no fever     Past Medical History:  Diagnosis Date  . ADHD (attention deficit hyperactivity disorder)   . Asthma     Patient Active Problem List   Diagnosis Date Noted  . BMI 40.0-44.9, adult ([facility]) 09/08/2018  . Tobacco use disorder 09/08/2018    History reviewed. No pertinent surgical history.   OB History   No obstetric history on file.      Home Medications    Prior to Admission medications   Medication Sig Start Date End Date Taking? Authorizing Provider  amoxicillin-clavulanate (AUGMENTIN) 875-125 MG tablet Take 1 tablet by mouth 2 (two) times daily for 7 days. 06/04/19 06/11/19  Claude MangesSoto, Izsak Meir, PA-C  ARIPiprazole (ABILIFY) 10 MG tablet Take 10 mg by mouth daily.    [provider]  ibuprofen (ADVIL,MOTRIN) 200 MG tablet Take 200 mg by mouth every 6 (six) hours as needed.    [provider]  naproxen (NAPROSYN) 500 MG tablet Take 1 tablet (500 mg total) by mouth 2 (two) times daily with a meal. 06/01/19   Cirigliano, Jearld LeschMary K, DO  Oxcarbazepine (TRILEPTAL) 300 MG tablet Take 300 mg by mouth 2 (two) times daily.    [provider]  tiZANidine  (ZANAFLEX) 4 MG capsule Take 1 capsule (4 mg total) by mouth at bedtime as needed for muscle spasms. 06/01/19   CiriglianoJearld Lesch, Mary K, DO    Family History Family History  Problem Relation Age of Onset  . Hypertension Mother   . Heart disease Maternal Grandmother   . Hyperlipidemia Maternal Grandmother     Social History Social History   Tobacco Use  . Smoking status: Current Every Day Smoker    Packs/day: 0.50    Years: 10.00    Pack years: 5.00    Types: Cigarettes  . Smokeless tobacco: Never Used  Substance Use Topics  . Alcohol use: Yes    Alcohol/week: 3.0 standard drinks    Types: 1 Glasses of wine, 2 Cans of beer per week  . Drug use: No     Allergies   Patient has no known allergies.   Review of Systems Review of Systems  Constitutional: Negative for fever.  HENT: Positive for ear pain.      Physical Exam Updated Vital Signs BP 117/79   Pulse 68   Temp 98.8 F (37.1 C) (Oral)   Resp 18   Ht 5\' 2"  (1.575 m)   Wt 97.5 kg   LMP 05/31/2019   SpO2 97%   BMI 39.32 kg/m   Physical Exam Vitals signs and nursing note reviewed.  Constitutional:  Appearance: Normal appearance.  HENT:     Head: Normocephalic and atraumatic.     Right Ear: There is no impacted cerumen.     Left Ear: External ear normal. Decreased hearing noted. There is impacted cerumen.     Mouth/Throat:     Mouth: Mucous membranes are dry.  Eyes:     Pupils: Pupils are equal, round, and reactive to light.  Neck:     Musculoskeletal: Normal range of motion and neck supple.  Cardiovascular:     Rate and Rhythm: Normal rate.  Pulmonary:     Effort: Pulmonary effort is normal.  Abdominal:     General: Abdomen is flat.  Musculoskeletal:        General: No tenderness.  Skin:    Capillary Refill: Capillary refill takes more than 3 seconds.  Neurological:     Mental Status: She is alert.      ED Treatments / Results  Labs (all labs ordered are listed, but only abnormal results  are displayed) Labs Reviewed - No data to display  EKG None  Radiology No results found.  Procedures .Ear Cerumen Removal  Date/Time: 06/04/2019 6:51 PM Performed by: Claude MangesSoto, Manpreet Kemmer, PA-C Authorized by: Claude MangesSoto, Lavonte Palos, PA-C   Consent:    Consent obtained:  Verbal   Risks discussed:  Bleeding, infection and incomplete removal   Alternatives discussed:  No treatment Procedure details:    Location:  L ear   Procedure type: irrigation   Post-procedure details:    Inspection:  Macerated skin   Hearing quality:  Improved   Patient tolerance of procedure:  Tolerated well, no immediate complications Comments:     TM visualized, bulging present, erythema noted to the area.  Removal of a foreign body of an earring from patient's left ear.   (including critical care time)  Medications Ordered in ED Medications  docusate (COLACE) 50 MG/5ML liquid 50 mg (50 mg Oral Given 06/04/19 1737)     Initial Impression / Assessment and Plan / ED Course  I have reviewed the triage vital signs and the nursing notes.  Pertinent labs & imaging results that were available during my care of the patient were reviewed by me and considered in my medical decision making (see chart for details).       Patient with no pertinent past medical history presents to the ED with complaints of left ear pain which began 3 days ago, she was attempted to clean out her ear with peroxide along with Q-tips without improvement in symptoms.  She reports decrease in hearing, during primary evaluation ear canal is visualized, cerumen impaction noted on TM.  Will place Colace along with irrigate patient's ear in order to remove cerumen at this time and further evaluate TM.  Patient otherwise well-appearing, vital signs are within normal limits and stable.  I have personally removed a left earring from left patient's ear, unknown how long this was on there.  Patient will be placed on antibiotics prophylactically to help treat her  symptoms.  She will also follow-up with ENT.  Hearing improved significantly after procedure was done.  Ear canal appears within normal limits.  Patient stable for discharge.   Portions of this note were generated with Scientist, clinical (histocompatibility and immunogenetics)Dragon dictation software. Dictation errors may occur despite best attempts at proofreading.    Final Clinical Impressions(s) / ED Diagnoses   Final diagnoses:  Impacted cerumen of left ear  Otalgia of left ear  Acute serous otitis media of left ear, recurrence not specified  ED Discharge Orders         Ordered    amoxicillin-clavulanate (AUGMENTIN) 875-125 MG tablet  Every 12 hours,   Status:  Discontinued     06/04/19 1850    amoxicillin-clavulanate (AUGMENTIN) 875-125 MG tablet  2 times daily     06/04/19 1853           Janeece Fitting, PA-C 06/04/19 1853    Hayden Rasmussen, MD 06/04/19 1946

## 2019-06-04 NOTE — Discharge Instructions (Signed)
I prescribed antibiotics to help treat your ear infection, please take this medication as prescribed.  A referral to ENT specialist attached to your chart, please schedule an appointment for further management of your left ear infection.

## 2019-06-04 NOTE — ED Triage Notes (Signed)
PResents with loss of hearing from left ear and left ear pain. Cerumen impaction noted.

## 2019-07-06 ENCOUNTER — Emergency Department (HOSPITAL_BASED_OUTPATIENT_CLINIC_OR_DEPARTMENT_OTHER)
Admission: EM | Admit: 2019-07-06 | Discharge: 2019-07-06 | Disposition: A | Discharge status: Home / Self Care | Payer: Self-pay | Attending: Emergency Medicine | Admitting: Emergency Medicine

## 2019-07-06 ENCOUNTER — Encounter (HOSPITAL_BASED_OUTPATIENT_CLINIC_OR_DEPARTMENT_OTHER): Payer: Self-pay

## 2019-07-06 ENCOUNTER — Other Ambulatory Visit: Payer: Self-pay

## 2019-07-06 DIAGNOSIS — J45909 Unspecified asthma, uncomplicated: Secondary | ICD-10-CM | POA: Insufficient documentation

## 2019-07-06 DIAGNOSIS — M545 Low back pain, unspecified: Secondary | ICD-10-CM

## 2019-07-06 DIAGNOSIS — F17210 Nicotine dependence, cigarettes, uncomplicated: Secondary | ICD-10-CM | POA: Insufficient documentation

## 2019-07-06 LAB — URINALYSIS, ROUTINE W REFLEX MICROSCOPIC
Bilirubin Urine: NEGATIVE
Glucose, UA: NEGATIVE mg/dL
Hgb urine dipstick: NEGATIVE
Ketones, ur: NEGATIVE mg/dL
Leukocytes,Ua: NEGATIVE
Nitrite: NEGATIVE
Protein, ur: NEGATIVE mg/dL
Specific Gravity, Urine: >1.03 — ABNORMAL HIGH (ref 1.005–1.030)
pH: 5.5 (ref 5.0–8.0)

## 2019-07-06 LAB — PREGNANCY, URINE: Preg Test, Ur: NEGATIVE

## 2019-07-06 MED ORDER — KETOROLAC TROMETHAMINE 30 MG/ML IJ SOLN
30.0000 mg | Freq: Once | INTRAMUSCULAR | Status: AC
  Administered 2019-07-06: 30 mg via INTRAMUSCULAR
  Filled 2019-07-06: qty 1

## 2019-07-06 MED ORDER — CYCLOBENZAPRINE HCL 5 MG PO TABS
5.0000 mg | ORAL_TABLET | Freq: Once | ORAL | Status: AC
  Administered 2019-07-06: 5 mg via ORAL
  Filled 2019-07-06: qty 1

## 2019-07-06 MED ORDER — NAPROXEN 500 MG PO TABS: 500.0000 mg | ORAL_TABLET | Freq: Two times a day (BID) | ORAL | 0 refills | Status: DC

## 2019-07-06 NOTE — Discharge Instructions (Addendum)
You were seen today for back pain.  Continue naproxen at home for anti-inflammatory effect.  Apply ice or heat whichever makes it feel better.

## 2019-07-06 NOTE — ED Triage Notes (Signed)
Pt c/o left sided lower back pain since yesterday. States she took muscle relaxer at home with no relief. Pt denies urinary symptoms or radiation of pain.

## 2019-07-06 NOTE — ED Provider Notes (Signed)
[facility] EMERGENCY DEPARTMENT Provider Note   CSN: [id number] Arrival date & time: 07/06/19  0053     History   Chief Complaint Chief Complaint  Patient presents with  . Flank Pain    HPI Evalena Clute is a 33 y.o. female.     HPI  This is a 33 year old female with history of tobacco abuse and asthma who presents with back pain.  Patient reports left lower back pain onset 2 days ago.  Pain is worse with certain movements and breathing.  No heavy lifting.  Denies shortness of breath or fevers.  She describes the pain as sharp.  It is currently 8 out of 10.  She took naproxen and a muscle relaxer at home with minimal relief.  Denies any hematuria or dysuria.  Does have a history of kidney stones but this feels somewhat different.  Denies any bowel or bladder difficulty, weakness, numbness, tingling of the lower extremities.  Denies heavy lifting or injury.  Past Medical History:  Diagnosis Date  . ADHD (attention deficit hyperactivity disorder)   . Asthma     Patient Active Problem List   Diagnosis Date Noted  . BMI 40.0-44.9, adult (HCC) 09/08/2018  . Tobacco use disorder 09/08/2018    History reviewed. No pertinent surgical history.   OB History   No obstetric history on file.      Home Medications    Prior to Admission medications   Medication Sig Start Date End Date Taking? Authorizing Provider  ARIPiprazole (ABILIFY) 10 MG tablet Take 10 mg by mouth daily.    [provider]  ibuprofen (ADVIL,MOTRIN) 200 MG tablet Take 200 mg by mouth every 6 (six) hours as needed.    [provider]  naproxen (NAPROSYN) 500 MG tablet Take 1 tablet (500 mg total) by mouth 2 (two) times daily with a meal. 06/01/19   Cirigliano, Mary K, DO  naproxen (NAPROSYN) 500 MG tablet Take 1 tablet (500 mg total) by mouth 2 (two) times daily. 07/06/19   Horton, Mayer Masker, MD  Oxcarbazepine (TRILEPTAL) 300 MG tablet Take 300 mg by mouth 2 (two) times daily.     [provider]  tiZANidine (ZANAFLEX) 4 MG capsule Take 1 capsule (4 mg total) by mouth at bedtime as needed for muscle spasms. 06/01/19   CiriglianoJearld Lesch, DO    Family History Family History  Problem Relation Age of Onset  . Hypertension Mother   . Heart disease Maternal Grandmother   . Hyperlipidemia Maternal Grandmother     Social History Social History   Tobacco Use  . Smoking status: Current Every Day Smoker    Packs/day: 0.50    Years: 10.00    Pack years: 5.00    Types: Cigarettes  . Smokeless tobacco: Never Used  Substance Use Topics  . Alcohol use: Yes    Alcohol/week: 3.0 standard drinks    Types: 1 Glasses of wine, 2 Cans of beer per week  . Drug use: No     Allergies   Patient has no known allergies.   Review of Systems Review of Systems  Constitutional: Negative for fever.  Respiratory: Negative for shortness of breath.   Cardiovascular: Negative for chest pain.  Genitourinary: Negative for dysuria and hematuria.  Musculoskeletal: Positive for back pain.  Neurological: Negative for weakness and numbness.  All other systems reviewed and are negative.    Physical Exam Updated Vital Signs BP 130/85 (BP Location: Right Arm)   Pulse 88  Temp 98.6 F (37 C) (Oral)   Resp 18   Ht 1.575 m (5\' 2" )   Wt 97.5 kg   LMP 06/26/2019   SpO2 98%   BMI 39.32 kg/m   Physical Exam Vitals signs and nursing note reviewed.  Constitutional:      Appearance: She is well-developed. She is obese. She is not ill-appearing.  HENT:     Head: Normocephalic and atraumatic.  Eyes:     Pupils: Pupils are equal, round, and reactive to light.  Cardiovascular:     Rate and Rhythm: Normal rate and regular rhythm.     Heart sounds: Normal heart sounds.  Pulmonary:     Effort: Pulmonary effort is normal. No respiratory distress.     Breath sounds: No wheezing.  Abdominal:     General: Bowel sounds are normal.     Palpations: Abdomen is soft.      Tenderness: There is no abdominal tenderness. There is no right CVA tenderness or left CVA tenderness.  Musculoskeletal:     Comments: Tenderness to palpation left paraspinous muscle region of the upper lumbar spine, no step-off or deformity, no overlying skin changes Negative straight leg raise  Skin:    General: Skin is warm and dry.  Neurological:     Mental Status: She is alert and oriented to person, place, and time.     Comments: 5 out of 5 strength bilateral lower extremities, normal gait  Psychiatric:        Mood and Affect: Mood normal.      ED Treatments / Results  Labs (all labs ordered are listed, but only abnormal results are displayed) Labs Reviewed  URINALYSIS, ROUTINE W REFLEX MICROSCOPIC - Abnormal; Notable for the following components:      Result Value   APPearance HAZY (*)    Specific Gravity, Urine >1.030 (*)    All other components within normal limits  PREGNANCY, URINE    EKG None  Radiology No results found.  Procedures Procedures (including critical care time)  Medications Ordered in ED Medications  ketorolac (TORADOL) 30 MG/ML injection 30 mg (30 mg Intramuscular Given 07/06/19 0129)  cyclobenzaprine (FLEXERIL) tablet 5 mg (5 mg Oral Given 07/06/19 0129)     Initial Impression / Assessment and Plan / ED Course  I have reviewed the triage vital signs and the nursing notes.  Pertinent labs & imaging results that were available during my care of the patient were reviewed by me and considered in my medical decision making (see chart for details).        Presents with back pain.  Overall nontoxic-appearing and vital signs are reassuring.  She has some reproducible tenderness on exam.  History of kidney stones but no urinary symptoms.  Urinalysis without any evidence of hemoglobin.  Patient was given Toradol and Flexeril.  On recheck, she states she feels better.  Given location and otherwise benign exam, suspect musculoskeletal etiology.  No red  flags of back pain.  Recommend continued NSAIDs.  Follow-up with primary physician if symptoms are not better in 1 week.  After history, exam, and medical workup I feel the patient has been appropriately medically screened and is safe for discharge home. Pertinent diagnoses were discussed with the patient. Patient was given return precautions.   Final Clinical Impressions(s) / ED Diagnoses   Final diagnoses:  Acute left-sided low back pain without sciatica    ED Discharge Orders         Ordered  naproxen (NAPROSYN) 500 MG tablet  2 times daily     07/06/19 0218           Shon BatonHorton, Courtney F, MD 07/06/19 [phone]

## 2020-02-07 ENCOUNTER — Emergency Department (HOSPITAL_BASED_OUTPATIENT_CLINIC_OR_DEPARTMENT_OTHER)
Admission: EM | Admit: 2020-02-07 | Discharge: 2020-02-07 | Disposition: A | Discharge status: Home / Self Care | Payer: Self-pay | Attending: Emergency Medicine | Admitting: Emergency Medicine

## 2020-02-07 ENCOUNTER — Encounter (HOSPITAL_BASED_OUTPATIENT_CLINIC_OR_DEPARTMENT_OTHER): Payer: Self-pay | Admitting: Emergency Medicine

## 2020-02-07 ENCOUNTER — Other Ambulatory Visit: Payer: Self-pay

## 2020-02-07 DIAGNOSIS — M722 Plantar fascial fibromatosis: Secondary | ICD-10-CM | POA: Insufficient documentation

## 2020-02-07 DIAGNOSIS — B353 Tinea pedis: Secondary | ICD-10-CM | POA: Insufficient documentation

## 2020-02-07 DIAGNOSIS — Z79899 Other long term (current) drug therapy: Secondary | ICD-10-CM | POA: Insufficient documentation

## 2020-02-07 DIAGNOSIS — J45909 Unspecified asthma, uncomplicated: Secondary | ICD-10-CM | POA: Insufficient documentation

## 2020-02-07 DIAGNOSIS — F17210 Nicotine dependence, cigarettes, uncomplicated: Secondary | ICD-10-CM | POA: Insufficient documentation

## 2020-02-07 NOTE — Discharge Instructions (Addendum)
Make sure you are wearing a shoe with a good sole all the time even at home.  Order the insoles that are supposed to be better for your feet.  You can take Tylenol or ibuprofen as needed for the pain.  Also you are starting to get a little bit of athletes feet between your toes and you can use the over-the-counter cream clotrimazole 2 times a day until that goes away.

## 2020-02-07 NOTE — ED Provider Notes (Signed)
[facility] EMERGENCY DEPARTMENT Provider Note   CSN: [id number] Arrival date & time: 02/07/20  2297     History Chief Complaint  Patient presents with  . Foot Pain    Amy Sexton is a 34 y.o. female.  Patient is a 34 year old female presenting today with complaint of right foot pain.  She states that for years she will have intermittent pain between her left and right foot but 2 days ago her right foot started hurting and it is gotten worse.  She states that she normally drives a forklift at work and she is had to start working on the floor because someone did not come to work so for the last 2 nights she has been on her feet for 12 hours on a hard floor.  She did try to buy inserts for her shoes but they did not have the kind she needed so she bought another kind and does not feel like it is helping.  She has no drainage, wounds or swelling to the feet.  No noted injury.  She states the pain is the worse when she gets out of bed and puts her barefoot on the floor.  The history is provided by the patient.  Foot Pain This is a recurrent problem. The current episode started 2 days ago. The problem occurs constantly. The problem has been gradually worsening. Associated symptoms comments: Right foot pain. The symptoms are aggravated by walking. The symptoms are relieved by rest. She has tried rest for the symptoms.       Past Medical History:  Diagnosis Date  . ADHD (attention deficit hyperactivity disorder)   . Asthma     Patient Active Problem List   Diagnosis Date Noted  . BMI 40.0-44.9, adult (HCC) 09/08/2018  . Tobacco use disorder 09/08/2018    History reviewed. No pertinent surgical history.   OB History   No obstetric history on file.     Family History  Problem Relation Age of Onset  . Hypertension Mother   . Heart disease Maternal Grandmother   . Hyperlipidemia Maternal Grandmother     Social History   Tobacco Use  . Smoking status: Current  Every Day Smoker    Packs/day: 0.50    Years: 10.00    Pack years: 5.00    Types: Cigarettes  . Smokeless tobacco: Never Used  Substance Use Topics  . Alcohol use: Yes    Alcohol/week: 3.0 standard drinks    Types: 1 Glasses of wine, 2 Cans of beer per week  . Drug use: No    Home Medications Prior to Admission medications   Medication Sig Start Date End Date Taking? Authorizing Provider  ARIPiprazole (ABILIFY) 10 MG tablet Take 10 mg by mouth daily.    [provider]  ibuprofen (ADVIL,MOTRIN) 200 MG tablet Take 200 mg by mouth every 6 (six) hours as needed.    [provider]  naproxen (NAPROSYN) 500 MG tablet Take 1 tablet (500 mg total) by mouth 2 (two) times daily with a meal. 06/01/19   Cirigliano, Mary K, DO  naproxen (NAPROSYN) 500 MG tablet Take 1 tablet (500 mg total) by mouth 2 (two) times daily. 07/06/19   Horton, Mayer Masker, MD  Oxcarbazepine (TRILEPTAL) 300 MG tablet Take 300 mg by mouth 2 (two) times daily.    [provider]  tiZANidine (ZANAFLEX) 4 MG capsule Take 1 capsule (4 mg total) by mouth at bedtime as needed for muscle spasms. 06/01/19   Cirigliano,  Garvin Fila, DO    Allergies    Patient has no known allergies.  Review of Systems   Review of Systems  All other systems reviewed and are negative.   Physical Exam Updated Vital Signs BP 120/64 (BP Location: Right Arm)   Pulse 94   Temp 98.7 F (37.1 C) (Oral)   Resp 18   Ht 5\' 2"  (1.575 m)   Wt 109.1 kg   LMP 01/09/2020   SpO2 96%   BMI 43.98 kg/m   Physical Exam Vitals and nursing note reviewed.  Constitutional:      General: She is not in acute distress.    Appearance: Normal appearance. She is obese.  HENT:     Head: Normocephalic.  Cardiovascular:     Rate and Rhythm: Normal rate.     Pulses: Normal pulses.  Pulmonary:     Effort: Pulmonary effort is normal.  Musculoskeletal:        General: Tenderness present.     Right foot: Normal range of motion. Tenderness  present. No swelling, deformity, bunion or bony tenderness.       Feet:     Comments: Patient has some mild maceration of her skin and concern for early developing fungal infection between the toes bilaterally  Skin:    General: Skin is warm.     Capillary Refill: Capillary refill takes less than 2 seconds.  Neurological:     General: No focal deficit present.     Mental Status: She is alert and oriented to person, place, and time. Mental status is at baseline.  Psychiatric:        Mood and Affect: Mood normal.        Behavior: Behavior normal.        Thought Content: Thought content normal.     ED Results / Procedures / Treatments   Labs (all labs ordered are listed, but only abnormal results are displayed) Labs Reviewed - No data to display  EKG None  Radiology No results found.  Procedures Procedures (including critical care time)  Medications Ordered in ED Medications - No data to display  ED Course  I have reviewed the triage vital signs and the nursing notes.  Pertinent labs & imaging results that were available during my care of the patient were reviewed by me and considered in my medical decision making (see chart for details).    MDM Rules/Calculators/A&P                      Patient presenting today with symptoms most consistent for plantar fasciitis in the right foot.  Patient never wears shoes at home and usually just wears socks and sandals if necessary.  Also at work she has been on her feet the last 2 days because she has not been doing her normal job.  Patient has pain along the plantar fascia in the arch of the foot on the right foot.  There is no evidence of infection and no report of injury.  Do not feel an x-ray will be helpful at this time.  Also concerned that patient may be developing tinea between the toes on both feet and was given treatment for that.  Encouraged her to follow-up with a podiatrist in order insoles that are appropriate for her feet.   Also encouraged her to wear a shoe with a good sole at all times even at home and after getting out of bed.  Final Clinical Impression(s) / ED  Diagnoses Final diagnoses:  Plantar fasciitis of right foot  Tinea pedis of both feet    Rx / DC Orders ED Discharge Orders    None       Gwyneth Sprout, MD 02/07/20 1049

## 2020-02-07 NOTE — ED Triage Notes (Signed)
Pt c/o pain to bottom of RT foot for about 3 wks

## 2020-05-15 ENCOUNTER — Ambulatory Visit (INDEPENDENT_AMBULATORY_CARE_PROVIDER_SITE_OTHER): Payer: No Payment, Other | Admitting: Licensed Clinical Social Worker

## 2020-05-15 ENCOUNTER — Other Ambulatory Visit: Payer: Self-pay

## 2020-05-15 DIAGNOSIS — F418 Other specified anxiety disorders: Secondary | ICD-10-CM | POA: Diagnosis not present
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
## 2020-05-30 ENCOUNTER — Other Ambulatory Visit: Payer: Self-pay

## 2020-05-30 ENCOUNTER — Telehealth (INDEPENDENT_AMBULATORY_CARE_PROVIDER_SITE_OTHER): Payer: No Payment, Other | Admitting: Psychiatric/Mental Health

## 2020-05-30 DIAGNOSIS — F315 Bipolar disorder, current episode depressed, severe, with psychotic features: Secondary | ICD-10-CM | POA: Insufficient documentation

## 2020-05-30 HISTORY — DX: Bipolar disorder, current episode depressed, severe, with psychotic features: F31.5

## 2020-05-30 MED ORDER — OXCARBAZEPINE 300 MG PO TABS: 300.0000 mg | ORAL_TABLET | Freq: Two times a day (BID) | ORAL | 3 refills | Status: DC

## 2020-05-30 MED ORDER — ARIPIPRAZOLE 10 MG PO TABS: 10.0000 mg | ORAL_TABLET | Freq: Every day | ORAL | 3 refills | Status: DC

## 2020-06-12 ENCOUNTER — Ambulatory Visit (HOSPITAL_COMMUNITY): Payer: 59 | Admitting: Licensed Clinical Social Worker

## 2020-06-12 ENCOUNTER — Other Ambulatory Visit: Payer: Self-pay

## 2020-06-12 ENCOUNTER — Telehealth (HOSPITAL_COMMUNITY): Payer: Self-pay | Admitting: Licensed Clinical Social Worker

## 2020-06-12 NOTE — Telephone Encounter (Signed)
LCSW sent link via text for 2:00 video session. When there was not a response or sign on from pt LCSW phoned pt. Clinician allowed the phone to ring numerous times. There was never a voice mail box option so no message was left.

## 2020-06-18 ENCOUNTER — Encounter (HOSPITAL_COMMUNITY): Payer: Self-pay | Admitting: Psychiatric/Mental Health

## 2020-06-18 NOTE — Progress Notes (Signed)
Psychiatric Initial Adult Assessment  Virtual Visit via Video Note  I connected with [facility] on8/4/21 at  8:00 AM EDT by a video enabled telemedicine application and verified that I am speaking with the correct person using two identifiers.   Location: Patient: Home Provider: Home office   I discussed the limitations of evaluation and management by telemedicine and the availability of in person appointments. The patient expressed understanding and agreed to proceed.  I provided 45 minutes of non-face-to-face time during this encounter.  Patient Identification: Amy Sexton MRN:  [id number] Date of Evaluation:  06/18/2020 Referral Source: Vesta Mixer Chief Complaint:  " I'm doing fine" Visit Diagnosis:    ICD-10-CM   1. Bipolar I disorder, most recent episode (or current) depressed, severe, specified as with psychotic behavior (HCC)  F31.5 ARIPiprazole (ABILIFY) 10 MG tablet    Oxcarbazepine (TRILEPTAL) 300 MG tablet    History of Present Illness: A 34 yo female with Bipolar disorder, PTSD, Alcohol abuse, Nicotine dependence reported that she is doing well. she stated her sleep and appetite are good. Denies any AVH, delusions, s/i or h/i. Pt stated her symptoms well improved with current med. She stated she missed sometimes taking her medications.  Past Psychiatric History: Bipolar disorder  Previous Psychotropic Medications: Yes   Substance Abuse History in the last 12 months:  No.  Consequences of Substance Abuse: NA  Past Medical History:  Past Medical History:  Diagnosis Date  . ADHD (attention deficit hyperactivity disorder)   . Asthma    No past surgical history on file.  Family Psychiatric History:   Family History:  Family History  Problem Relation Age of Onset  . Hypertension Mother   . Heart disease Maternal Grandmother   . Hyperlipidemia Maternal Grandmother     Social History:   Social History   Socioeconomic History  . Marital status: Single    Spouse  name: Not on file  . Number of children: Not on file  . Years of education: Not on file  . Highest education level: Not on file  Occupational History  . Not on file  Tobacco Use  . Smoking status: Current Every Day Smoker    Packs/day: 0.50    Years: 10.00    Pack years: 5.00    Types: Cigarettes  . Smokeless tobacco: Never Used  Vaping Use  . Vaping Use: Never used  Substance and Sexual Activity  . Alcohol use: Yes    Alcohol/week: 3.0 standard drinks    Types: 1 Glasses of wine, 2 Cans of beer per week  . Drug use: No  . Sexual activity: Yes    Birth control/protection: Condom  Other Topics Concern  . Not on file  Social History Narrative  . Not on file   Social Determinants of Health   Financial Resource Strain:   . Difficulty of Paying Living Expenses: Not on file  Food Insecurity:   . Worried About Programme researcher, broadcasting/film/video in the Last Year: Not on file  . Ran Out of Food in the Last Year: Not on file  Transportation Needs:   . Lack of Transportation (Medical): Not on file  . Lack of Transportation (Non-Medical): Not on file  Physical Activity:   . Days of Exercise per Week: Not on file  . Minutes of Exercise per Session: Not on file  Stress:   . Feeling of Stress : Not on file  Social Connections:   . Frequency of Communication with Friends and Family: Not on file  .  Frequency of Social Gatherings with Friends and Family: Not on file  . Attends Religious Services: Not on file  . Active Member of Clubs or Organizations: Not on file  . Attends Banker Meetings: Not on file  . Marital Status: Not on file    Additional Social History:   Allergies:  No Known Allergies  Metabolic Disorder Labs: Lab Results  Component Value Date   HGBA1C 5.5 01/25/2016   No results found for: PROLACTIN Lab Results  Component Value Date   CHOL 177 09/08/2018   TRIG 86.0 09/08/2018   HDL 40.10 09/08/2018   CHOLHDL 4 09/08/2018   VLDL 17.2 09/08/2018   LDLCALC  120 (H) 09/08/2018   Lab Results  Component Value Date   TSH 0.714 03/27/2017    Therapeutic Level Labs: No results found for: LITHIUM No results found for: CBMZ No results found for: VALPROATE  Current Medications: Current Outpatient Medications  Medication Sig Dispense Refill  . ARIPiprazole (ABILIFY) 10 MG tablet Take 1 tablet (10 mg total) by mouth daily. 30 tablet 3  . ibuprofen (ADVIL,MOTRIN) 200 MG tablet Take 200 mg by mouth every 6 (six) hours as needed.    . naproxen (NAPROSYN) 500 MG tablet Take 1 tablet (500 mg total) by mouth 2 (two) times daily with a meal. 60 tablet 1  . naproxen (NAPROSYN) 500 MG tablet Take 1 tablet (500 mg total) by mouth 2 (two) times daily. 30 tablet 0  . Oxcarbazepine (TRILEPTAL) 300 MG tablet Take 1 tablet (300 mg total) by mouth 2 (two) times daily. 60 tablet 3  . tiZANidine (ZANAFLEX) 4 MG capsule Take 1 capsule (4 mg total) by mouth at bedtime as needed for muscle spasms. 30 capsule 0   No current facility-administered medications for this visit.    Musculoskeletal: Strength & Muscle Tone: within normal limits Gait & Station: normal Patient leans: N/A  Psychiatric Specialty Exam: Review of Systems  Psychiatric/Behavioral: Negative for decreased concentration, hallucinations, sleep disturbance and suicidal ideas. The patient is not hyperactive.   All other systems reviewed and are negative.   There were no vitals taken for this visit.There is no height or weight on file to calculate BMI.  General Appearance: Casual  Eye Contact:  Good  Speech:  Clear and Coherent  Volume:  Normal  Mood:  Euthymic  Affect:  Appropriate  Thought Process:  Coherent and Descriptions of Associations: Intact  Orientation:  Full (Time, Place, and Person)  Thought Content:  WDL and Logical  Suicidal Thoughts:  No  Homicidal Thoughts:  No  Memory:  Immediate;   Good  Judgement:  Intact  Insight:  Fair  Psychomotor Activity:  Normal  Concentration:   Concentration: Good  Recall:  Good  Fund of Knowledge:Good  Language: Good  Akathisia:  NA  Handed:  Right  AIMS (if indicated):  not done  Assets:  Communication Skills Desire for Improvement Intimacy Resilience Talents/Skills  ADL's:  Intact  Cognition: WNL  Sleep:  Fair   Screenings: GAD-7     Office Visit from 08/25/2018 in [facility]  Total GAD-7 Score 13    PHQ2-9     Office Visit from 08/25/2018 in [facility] Office Visit from 04/25/2017 in Primary Care at [place] Office Visit from 03/27/2017 in Primary Care at [facility] Visit from 03/09/2017 in Primary Care at [facility] Visit from 01/28/2016 in [facility] Care at [facility] Total Score 4 1 5  0 0  PHQ-9  Total Score 12 9 12  -- --      Assessment and Plan: continue with medications as prescribed.    ICD-10-CM   1. Bipolar I disorder, most recent episode (or current) depressed, severe, specified as with psychotic behavior (HCC)  F31.5 ARIPiprazole (ABILIFY) 10 MG tablet    Oxcarbazepine (TRILEPTAL) 300 MG tablet    , NP 8/23/20214:31 AM

## 2020-07-05 ENCOUNTER — Encounter: Payer: Self-pay | Admitting: Family Medicine

## 2020-07-12 ENCOUNTER — Ambulatory Visit (HOSPITAL_COMMUNITY): Payer: Self-pay | Admitting: Licensed Clinical Social Worker

## 2020-07-25 ENCOUNTER — Ambulatory Visit (INDEPENDENT_AMBULATORY_CARE_PROVIDER_SITE_OTHER): Payer: No Payment, Other | Admitting: Licensed Clinical Social Worker

## 2020-07-25 ENCOUNTER — Other Ambulatory Visit: Payer: Self-pay

## 2020-07-25 DIAGNOSIS — F418 Other specified anxiety disorders: Secondary | ICD-10-CM
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
## 2020-08-30 ENCOUNTER — Telehealth (HOSPITAL_COMMUNITY): Payer: No Payment, Other

## 2020-08-30 ENCOUNTER — Telehealth (HOSPITAL_COMMUNITY): Payer: Self-pay | Admitting: Psychiatry

## 2020-08-30 NOTE — Telephone Encounter (Signed)
Provider is okay with patient being rescheduled.

## 2020-09-01 ENCOUNTER — Encounter (HOSPITAL_COMMUNITY): Payer: Self-pay | Admitting: Emergency Medicine

## 2020-09-01 ENCOUNTER — Other Ambulatory Visit: Payer: Self-pay

## 2020-09-01 ENCOUNTER — Emergency Department (HOSPITAL_COMMUNITY)
Admission: EM | Admit: 2020-09-01 | Discharge: 2020-09-01 | Disposition: A | Discharge status: Home / Self Care | Payer: Managed Care, Other (non HMO) | Attending: Emergency Medicine | Admitting: Emergency Medicine

## 2020-09-01 DIAGNOSIS — F17210 Nicotine dependence, cigarettes, uncomplicated: Secondary | ICD-10-CM | POA: Diagnosis not present

## 2020-09-01 DIAGNOSIS — M62838 Other muscle spasm: Secondary | ICD-10-CM

## 2020-09-01 DIAGNOSIS — J45909 Unspecified asthma, uncomplicated: Secondary | ICD-10-CM | POA: Diagnosis not present

## 2020-09-01 DIAGNOSIS — M542 Cervicalgia: Secondary | ICD-10-CM | POA: Diagnosis present

## 2020-09-01 MED ORDER — CYCLOBENZAPRINE HCL 5 MG PO TABS: 5.0000 mg | ORAL_TABLET | Freq: Two times a day (BID) | ORAL | 0 refills | Status: DC | PRN

## 2020-09-01 MED ORDER — KETOROLAC TROMETHAMINE 30 MG/ML IJ SOLN
30.0000 mg | Freq: Once | INTRAMUSCULAR | Status: AC
  Administered 2020-09-01: 30 mg via INTRAMUSCULAR
  Filled 2020-09-01: qty 1

## 2020-09-01 MED ORDER — NAPROXEN 500 MG PO TABS: 500.0000 mg | ORAL_TABLET | Freq: Two times a day (BID) | ORAL | 0 refills | Status: DC

## 2020-09-01 NOTE — ED Provider Notes (Signed)
[facility]-EMERGENCY DEPT Provider Note   CSN: [id number] Arrival date & time: 09/01/20  0026     History Chief Complaint  Patient presents with  . Neck Pain    Amy Sexton is a 34 y.o. female.  HPI     This is a 34 year old female with a history of ADHD and asthma who presents with left neck pain.  Patient reports that she woke up from sleep on Thursday morning with pain in the left neck.  She states that it felt stiff.  She applied heat with some improvement.  She has not taken any oral medications.  She states that she is continued to have pain in the left neck that radiates into the right shoulder.  It is exacerbated at work.  She is gradually had increased mobility of the neck but continues to have pain.  She rates her pain 8 out of 10.  Denies numbness or tingling in the arm.  She is right-handed.  She denies any significant injury.  She does believe she may have slept on her own.  Past Medical History:  Diagnosis Date  . ADHD (attention deficit hyperactivity disorder)   . Asthma     Patient Active Problem List   Diagnosis Date Noted  . Bipolar I disorder, most recent episode (or current) depressed, severe, specified as with psychotic behavior (HCC) 05/30/2020  . BMI 40.0-44.9, adult (HCC) 09/08/2018  . Tobacco use disorder 09/08/2018    History reviewed. No pertinent surgical history.   OB History   No obstetric history on file.     Family History  Problem Relation Age of Onset  . Hypertension Mother   . Heart disease Maternal Grandmother   . Hyperlipidemia Maternal Grandmother     Social History   Tobacco Use  . Smoking status: Current Every Day Smoker    Packs/day: 0.50    Years: 10.00    Pack years: 5.00    Types: Cigarettes  . Smokeless tobacco: Never Used  Vaping Use  . Vaping Use: Never used  Substance Use Topics  . Alcohol use: Yes    Alcohol/week: 3.0 standard drinks    Types: 1 Glasses of wine, 2 Cans of beer per  week  . Drug use: No    Home Medications Prior to Admission medications   Medication Sig Start Date End Date Taking? Authorizing Provider  ARIPiprazole (ABILIFY) 10 MG tablet Take 1 tablet (10 mg total) by mouth daily. 05/30/20 09/27/20  Jearld Lesch, NP  cyclobenzaprine (FLEXERIL) 5 MG tablet Take 1 tablet (5 mg total) by mouth 2 (two) times daily as needed for muscle spasms. 09/01/20   Juelz Claar, Mayer Masker, MD  ibuprofen (ADVIL,MOTRIN) 200 MG tablet Take 200 mg by mouth every 6 (six) hours as needed.    [provider]  naproxen (NAPROSYN) 500 MG tablet Take 1 tablet (500 mg total) by mouth 2 (two) times daily. 09/01/20   Kayvon Mo, Mayer Masker, MD  Oxcarbazepine (TRILEPTAL) 300 MG tablet Take 1 tablet (300 mg total) by mouth 2 (two) times daily. 05/30/20 09/27/20  Jearld Lesch, NP  tiZANidine (ZANAFLEX) 4 MG capsule Take 1 capsule (4 mg total) by mouth at bedtime as needed for muscle spasms. 06/01/19   Overton Mam, DO    Allergies    Patient has no known allergies.  Review of Systems   Review of Systems  Constitutional: Negative for fever.  Cardiovascular: Negative for chest pain.  Musculoskeletal: Positive for neck pain and  neck stiffness.  Neurological: Negative for weakness and numbness.  All other systems reviewed and are negative.   Physical Exam Updated Vital Signs BP (!) 152/100 (BP Location: Left Arm)   Pulse 62   Temp 99.1 F (37.3 C) (Oral)   Resp 16   SpO2 99%   Physical Exam Vitals and nursing note reviewed.  Constitutional:      Appearance: She is well-developed. She is obese. She is not ill-appearing.  HENT:     Head: Normocephalic and atraumatic.     Nose: Nose normal.     Mouth/Throat:     Mouth: Mucous membranes are moist.  Eyes:     Pupils: Pupils are equal, round, and reactive to light.  Neck:     Comments: Tenderness to palpation left upper trapezius with spasm noted, no torticollis, range of motion intact Cardiovascular:     Rate and  Rhythm: Normal rate and regular rhythm.  Pulmonary:     Effort: Pulmonary effort is normal. No respiratory distress.     Breath sounds: No wheezing.  Abdominal:     Palpations: Abdomen is soft.  Musculoskeletal:     Cervical back: Neck supple.     Comments: Tenderness palpation left neck region, normal range of motion of the left shoulder, no obvious deformities, 2+ distal radial pulse  Skin:    General: Skin is warm and dry.  Neurological:     Mental Status: She is alert and oriented to person, place, and time.  Psychiatric:        Mood and Affect: Mood normal.     ED Results / Procedures / Treatments   Labs (all labs ordered are listed, but only abnormal results are displayed) Labs Reviewed - No data to display  EKG None  Radiology No results found.  Procedures Procedures (including critical care time)  Medications Ordered in ED Medications  ketorolac (TORADOL) 30 MG/ML injection 30 mg (has no administration in time range)    ED Course  I have reviewed the triage vital signs and the nursing notes.  Pertinent labs & imaging results that were available during my care of the patient were reviewed by me and considered in my medical decision making (see chart for details).    MDM Rules/Calculators/A&P                          Patient presents with left neck and arm pain she is overall nontoxic and vital signs are reassuring.  Pain and mobility improved with heat.  She does report that she woke up with this discomfort.  She has point tenderness on exam.  This is highly suggestive of muscular etiology.  Doubt fracture or acute injury.  Do not believe she needs x-rays.  Patient was given IM Toradol.  Will discharge with naproxen and Flexeril for pain control.  Recommend ongoing heat as tolerated and range of motion exercises of the neck.  After history, exam, and medical workup I feel the patient has been appropriately medically screened and is safe for discharge home.  Pertinent diagnoses were discussed with the patient. Patient was given return precautions.  Final Clinical Impression(s) / ED Diagnoses Final diagnoses:  Muscle spasms of neck    Rx / DC Orders ED Discharge Orders         Ordered    naproxen (NAPROSYN) 500 MG tablet  2 times daily        09/01/20 0321    cyclobenzaprine (FLEXERIL)  5 MG tablet  2 times daily PRN        09/01/20 0321           Shon Baton, MD 09/01/20 [phone]

## 2020-09-01 NOTE — ED Triage Notes (Addendum)
Patient states she woke up yesterday with neck pain. Denies injury. States she may have slept on it wrong. States that heat helped with the pain.

## 2020-09-01 NOTE — Discharge Instructions (Addendum)
You were seen today for neck pain.  You likely have spasms in the muscle of the neck and shoulder which could be related to sleeping in an odd position.  Take medications as prescribed.  Do not drive while taking Flexeril.

## 2020-10-03 ENCOUNTER — Telehealth (INDEPENDENT_AMBULATORY_CARE_PROVIDER_SITE_OTHER): Payer: No Payment, Other | Admitting: Psychiatry

## 2020-10-03 ENCOUNTER — Encounter (HOSPITAL_COMMUNITY): Payer: Self-pay | Admitting: Psychiatry

## 2020-10-03 ENCOUNTER — Other Ambulatory Visit: Payer: Self-pay

## 2020-10-03 DIAGNOSIS — F315 Bipolar disorder, current episode depressed, severe, with psychotic features: Secondary | ICD-10-CM | POA: Diagnosis not present

## 2020-10-03 MED ORDER — ARIPIPRAZOLE 10 MG PO TABS: 10.0000 mg | ORAL_TABLET | Freq: Every day | ORAL | 2 refills | Status: DC

## 2020-10-03 MED ORDER — OXCARBAZEPINE 600 MG PO TABS: 600.0000 mg | ORAL_TABLET | Freq: Two times a day (BID) | ORAL | 2 refills | Status: DC
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
## 2020-10-18 ENCOUNTER — Ambulatory Visit: Payer: Managed Care, Other (non HMO) | Admitting: Family Medicine

## 2020-11-07 ENCOUNTER — Encounter (HOSPITAL_COMMUNITY): Payer: Self-pay | Admitting: Psychiatry

## 2020-11-07 ENCOUNTER — Other Ambulatory Visit: Payer: Self-pay

## 2020-11-07 ENCOUNTER — Telehealth (INDEPENDENT_AMBULATORY_CARE_PROVIDER_SITE_OTHER): Payer: 59 | Admitting: Psychiatry

## 2020-11-07 DIAGNOSIS — F315 Bipolar disorder, current episode depressed, severe, with psychotic features: Secondary | ICD-10-CM | POA: Diagnosis not present

## 2020-11-07 MED ORDER — ARIPIPRAZOLE 10 MG PO TABS: 10.0000 mg | ORAL_TABLET | Freq: Every day | ORAL | 2 refills | Status: DC

## 2020-11-07 MED ORDER — OXCARBAZEPINE 600 MG PO TABS: 600.0000 mg | ORAL_TABLET | Freq: Two times a day (BID) | ORAL | 2 refills | Status: DC

## 2020-11-07 NOTE — Progress Notes (Signed)
BH MD/PA/NP OP Progress Note Virtual Visit via Video Note  I connected with Amy Sexton on 11/07/20 at 10:30 AM EST by a video enabled telemedicine application and verified that I am speaking with the correct person using two identifiers.  Location: Patient: Home Provider: Clinic   I discussed the limitations of evaluation and management by telemedicine and the availability of in person appointments. The patient expressed understanding and agreed to proceed.  I provided 30 inutes of non-face-to-face time during this encounter.    11/07/2020 10:50 AM Amy Sexton  MRN:  [id number]  Chief Complaint: "I haven't been as up and down"  HPI: 35 year old female seen today for follow-up psychiatric evaluation. She has a psychiatric history of Bipolar disorder, ADHD, and tobacco dependence. She is currently managed on trileptal 600 mg twice daily and Abilify 10 mg. She noted that her medications are somewhat effective in managing her psychiatric evaluations.   Today he is well groomed pleasant, cooperative, engaged in conversation, and maintained eye contact. She informed provider that anxiety and depression has improved since her last visit.  Provider conducted a GAD-7 and patient scored 9, at last visit patient for an 11. Provider also conducted a PHQ-9 and patient scored an 11, at last visit patient scored 19.  She notes at times he feels paranoid and believes people are walking behind her howevershe does not want her Abilify adjusted at this time.  She reported that she works 12 hours and notes that her sleep is increased.  She informed provider that she works 3rd shift at the [employer] which she reports can be tiring.    States that her mood has been more stable since increasing Trileptal.  She notes that her mood has not been as up-and-down.  She also notes that she is less irritable, distractible, or impulsive.  She denies SI/HI/VAH.  No medication changes made. Patient is agreeable to  continue medications as prescribed. No other concerns noted at this time.     Visit Diagnosis:    ICD-10-CM   1. Bipolar I disorder, most recent episode (or current) depressed, severe, specified as with psychotic behavior (HCC)  F31.5 oxcarbazepine (TRILEPTAL) 600 MG tablet    ARIPiprazole (ABILIFY) 10 MG tablet    Past Psychiatric History: Bipolar disorder, ADHD, and tobacco dependence.  Past Medical History:  Past Medical History:  Diagnosis Date  . ADHD (attention deficit hyperactivity disorder)   . Asthma    No past surgical history on file.  Family Psychiatric History: Denies  Family History:  Family History  Problem Relation Age of Onset  . Hypertension Mother   . Heart disease Maternal Grandmother   . Hyperlipidemia Maternal Grandmother     Social History:  Social History   Socioeconomic History  . Marital status: Single    Spouse name: Not on file  . Number of children: Not on file  . Years of education: Not on file  . Highest education level: Not on file  Occupational History  . Not on file  Tobacco Use  . Smoking status: Current Every Day Smoker    Packs/day: 0.50    Years: 10.00    Pack years: 5.00    Types: Cigarettes  . Smokeless tobacco: Never Used  Vaping Use  . Vaping Use: Never used  Substance and Sexual Activity  . Alcohol use: Yes    Alcohol/week: 3.0 standard drinks    Types: 1 Glasses of wine, 2 Cans of beer per week  . Drug use: No  .  Sexual activity: Yes    Birth control/protection: Condom  Other Topics Concern  . Not on file  Social History Narrative  . Not on file   Social Determinants of Health   Financial Resource Strain: Not on file  Food Insecurity: Not on file  Transportation Needs: Not on file  Physical Activity: Not on file  Stress: Not on file  Social Connections: Not on file    Allergies: No Known Allergies  Metabolic Disorder Labs: Lab Results  Component Value Date   HGBA1C 5.5 01/25/2016   No results  found for: PROLACTIN Lab Results  Component Value Date   CHOL 177 09/08/2018   TRIG 86.0 09/08/2018   HDL 40.10 09/08/2018   CHOLHDL 4 09/08/2018   VLDL 17.2 09/08/2018   LDLCALC 120 (H) 09/08/2018   Lab Results  Component Value Date   TSH 0.714 03/27/2017   TSH 0.81 01/25/2016    Therapeutic Level Labs: No results found for: LITHIUM No results found for: VALPROATE No components found for:  CBMZ  Current Medications: Current Outpatient Medications  Medication Sig Dispense Refill  . ARIPiprazole (ABILIFY) 10 MG tablet Take 1 tablet (10 mg total) by mouth daily. 30 tablet 2  . cyclobenzaprine (FLEXERIL) 5 MG tablet Take 1 tablet (5 mg total) by mouth 2 (two) times daily as needed for muscle spasms. 10 tablet 0  . ibuprofen (ADVIL,MOTRIN) 200 MG tablet Take 200 mg by mouth every 6 (six) hours as needed.    . naproxen (NAPROSYN) 500 MG tablet Take 1 tablet (500 mg total) by mouth 2 (two) times daily. 30 tablet 0  . oxcarbazepine (TRILEPTAL) 600 MG tablet Take 1 tablet (600 mg total) by mouth 2 (two) times daily. 60 tablet 2  . tiZANidine (ZANAFLEX) 4 MG capsule Take 1 capsule (4 mg total) by mouth at bedtime as needed for muscle spasms. 30 capsule 0   No current facility-administered medications for this visit.     Musculoskeletal: Strength & Muscle Tone: Unable to assess due to telehealth visit Gait & Station: Unable to assess due to telehealth visit Patient leans: N/A  Psychiatric Specialty Exam: Review of Systems  There were no vitals taken for this visit.There is no height or weight on file to calculate BMI.  General Appearance: Well Groomed  Eye Contact:  Good  Speech:  Clear and Coherent and Normal Rate  Volume:  Normal  Mood:  Euthymic and Notes that she occassional is anxious and depressed.   Affect:  Appropriate and Congruent  Thought Process:  Coherent, Goal Directed and Linear  Orientation:  Full (Time, Place, and Person)  Thought Content: Logical and  Paranoid Ideation   Suicidal Thoughts:  No  Homicidal Thoughts:  No  Memory:  Immediate;   Good Recent;   Good Remote;   Good  Judgement:  Good  Insight:  Good  Psychomotor Activity:  Normal  Concentration:  Concentration: Good and Attention Span: Good  Recall:  Good  Fund of Knowledge: Good  Language: Good  Akathisia:  No  Handed:  Right  AIMS (if indicated):Not done  Assets:  Communication Skills Desire for Improvement Financial Resources/Insurance Housing Social Support  ADL's:  Intact  Cognition: WNL  Sleep:  Good   Screenings: GAD-7   Flowsheet Row Video Visit from 11/07/2020 in [facility] Video Visit from 10/03/2020 in [facility] Office Visit from 08/25/2018 in [facility]  Total GAD-7 Score 9 11 13     [phone]  Flowsheet Row Video Visit from 11/07/2020 in [facility] Video Visit from 10/03/2020 in [facility] Office Visit from 08/25/2018 in [facility] Office Visit from 04/25/2017 in Primary Care at [facility] Visit from 03/27/2017 in Primary Care at [facility] Total Score 3 5 4 1 5   PHQ-9 Total Score 11 19 12 9 12        Assessment and Plan: Patient notes that his anxiety, depression, and mood have improved since her last visit. She notes she still has some paranoia but notes that she does not want her medications adjusted. No medication changes made today. Patient will continue medications as prescribed.   1. Bipolar I disorder, most recent episode (or current) depressed, severe, specified as with psychotic behavior (HCC)  Continue- oxcarbazepine (TRILEPTAL) 600 MG tablet; Take 1 tablet (600 mg total) by mouth 2 (two) times daily.  Dispense: 60 tablet; Refill: 2 Continue- ARIPiprazole (ABILIFY) 10 MG tablet; Take 1 tablet (10 mg total) by mouth daily.  Dispense: 30 tablet; Refill: 2  Follow up in 3  months Follow up with therapy.    , NP 11/07/2020, 10:50 AM

## 2021-01-01 NOTE — Patient Instructions (Addendum)
Health Maintenance Due  Topic Date Due  . Hepatitis C Screening  Never done  . COVID-19 Vaccine (1) Never done  . HIV Screening  Never done  . INFLUENZA VACCINE  05/27/2020    Depression screen [facility] 2/9 08/25/2018 08/25/2018 04/25/2017  Decreased Interest 2 2 1   Down, Depressed, Hopeless 2 1 0  PHQ - 2 Score 4 3 1   Altered sleeping 3 - 3  Tired, decreased energy 0 - 2  Change in appetite 2 - 1  Feeling bad or failure about yourself  2 - 0  Trouble concentrating 1 - 2  Moving slowly or fidgety/restless 0 - 0  Suicidal thoughts 0 - 0  PHQ-9 Score 12 - 9  Difficult doing work/chores - - Somewhat difficult  Some encounter information is confidential and restricted. Go to Review Flowsheets activity to see all data.   Start amlodipine 10mg  daily Low salt diet Follow-up in 2 wks

## 2021-01-03 ENCOUNTER — Ambulatory Visit (INDEPENDENT_AMBULATORY_CARE_PROVIDER_SITE_OTHER): Payer: Managed Care, Other (non HMO) | Admitting: Family Medicine

## 2021-01-03 ENCOUNTER — Encounter: Payer: Self-pay | Admitting: Family Medicine

## 2021-01-03 ENCOUNTER — Other Ambulatory Visit: Payer: Self-pay

## 2021-01-03 VITALS — BP 130/100 | HR 74 | Temp 97.2°F | Ht 63.0 in | Wt 222.0 lb

## 2021-01-03 DIAGNOSIS — Z0000 Encounter for general adult medical examination without abnormal findings: Secondary | ICD-10-CM

## 2021-01-03 DIAGNOSIS — Z13220 Encounter for screening for lipoid disorders: Secondary | ICD-10-CM

## 2021-01-03 DIAGNOSIS — M62838 Other muscle spasm: Secondary | ICD-10-CM

## 2021-01-03 DIAGNOSIS — I10 Essential (primary) hypertension: Secondary | ICD-10-CM

## 2021-01-03 LAB — CBC
HCT: 38.7 % (ref 36.0–46.0)
Hemoglobin: 12.1 g/dL (ref 12.0–15.0)
MCHC: 31.4 g/dL (ref 30.0–36.0)
MCV: 71 fl — ABNORMAL LOW (ref 78.0–100.0)
Platelets: 216 10*3/uL (ref 150.0–400.0)
RBC: 5.45 Mil/uL — ABNORMAL HIGH (ref 3.87–5.11)
RDW: 16.5 % — ABNORMAL HIGH (ref 11.5–15.5)
WBC: 8.2 10*3/uL (ref 4.0–10.5)

## 2021-01-03 LAB — LIPID PANEL
Cholesterol: 149 mg/dL (ref 0–200)
HDL: 41.6 mg/dL (ref >39.00)
LDL Cholesterol: 97 mg/dL (ref 0–99)
NonHDL: 107.62
Total CHOL/HDL Ratio: 4
Triglycerides: 54 mg/dL (ref 0.0–149.0)
VLDL: 10.8 mg/dL (ref 0.0–40.0)

## 2021-01-03 LAB — BASIC METABOLIC PANEL
BUN: 11 mg/dL (ref 6–23)
CO2: 29 mEq/L (ref 19–32)
Calcium: 9.1 mg/dL (ref 8.4–10.5)
Chloride: 107 mEq/L (ref 96–112)
Creatinine, Ser: 0.93 mg/dL (ref 0.40–1.20)
GFR: 79.47 mL/min (ref >60.00)
Glucose, Bld: 64 mg/dL — ABNORMAL LOW (ref 70–99)
Potassium: 4 mEq/L (ref 3.5–5.1)
Sodium: 141 mEq/L (ref 135–145)

## 2021-01-03 LAB — ALT: ALT: 19 U/L (ref 0–35)

## 2021-01-03 LAB — AST: AST: 18 U/L (ref 0–37)

## 2021-01-03 MED ORDER — CYCLOBENZAPRINE HCL 5 MG PO TABS: 5.0000 mg | ORAL_TABLET | Freq: Two times a day (BID) | ORAL | 1 refills | Status: DC | PRN

## 2021-01-03 MED ORDER — AMLODIPINE BESYLATE 10 MG PO TABS: 10.0000 mg | ORAL_TABLET | Freq: Every day | ORAL | 3 refills | Status: DC

## 2021-01-03 NOTE — Progress Notes (Signed)
Amy Sexton is a 35 y.o. female  Chief Complaint  Patient presents with  . Annual Exam    Pt fasting today.  Pt UTD on Covid vaccines.  Pt c/o elevated Bp x 2 weeks.    HPI: Amy Sexton is a 35 y.o. female seen today for annual CPE, fasting labs.  Last PAP: 08/2018 - normal PAP, HPV negative (pt did test positive and was treated for trichomonas at that time)  Diet/Exercise: does not eat out often, does snack on chips; no regular exercise Dental: overdue and pt is having dental pain in broken tooth. Pt has appt on 02/04/21 at [facility] Vision: UTD - 09/2020   Med refills needed today? Yes flexeril for MSK neck and shoulder pain   Past Medical History:  Diagnosis Date  . ADHD (attention deficit hyperactivity disorder)   . Asthma     History reviewed. No pertinent surgical history.  Social History   Socioeconomic History  . Marital status: Single    Spouse name: Not on file  . Number of children: Not on file  . Years of education: Not on file  . Highest education level: Not on file  Occupational History  . Not on file  Tobacco Use  . Smoking status: Current Every Day Smoker    Packs/day: 0.50    Years: 10.00    Pack years: 5.00    Types: Cigarettes  . Smokeless tobacco: Never Used  Vaping Use  . Vaping Use: Never used  Substance and Sexual Activity  . Alcohol use: Yes    Alcohol/week: 3.0 standard drinks    Types: 1 Glasses of wine, 2 Cans of beer per week  . Drug use: No  . Sexual activity: Yes    Birth control/protection: Condom  Other Topics Concern  . Not on file  Social History Narrative  . Not on file   Social Determinants of Health   Financial Resource Strain: Not on file  Food Insecurity: Not on file  Transportation Needs: Not on file  Physical Activity: Not on file  Stress: Not on file  Social Connections: Not on file  Intimate Partner Violence: Not on file    Family History  Problem Relation Age of Onset  . Hypertension  Mother   . Heart disease Maternal Grandmother   . Hyperlipidemia Maternal Grandmother      Immunization History  Administered Date(s) Administered  . Influenza-Unspecified 08/04/2017, 07/21/2018  . PFIZER(Purple Top)SARS-COV-2 Vaccination 07/06/2020, 07/27/2020  . Tdap 01/30/2013    Outpatient Encounter Medications as of 01/03/2021  Medication Sig  . ARIPiprazole (ABILIFY) 10 MG tablet Take 1 tablet (10 mg total) by mouth daily.  . cyclobenzaprine (FLEXERIL) 5 MG tablet Take 1 tablet (5 mg total) by mouth 2 (two) times daily as needed for muscle spasms.  Marland Kitchen ibuprofen (ADVIL,MOTRIN) 200 MG tablet Take 200 mg by mouth every 6 (six) hours as needed.  Marland Kitchen oxcarbazepine (TRILEPTAL) 600 MG tablet Take 1 tablet (600 mg total) by mouth 2 (two) times daily.  . [DISCONTINUED] cyclobenzaprine (FLEXERIL) 5 MG tablet Take 1 tablet (5 mg total) by mouth 2 (two) times daily as needed for muscle spasms.  . naproxen (NAPROSYN) 500 MG tablet Take 1 tablet (500 mg total) by mouth 2 (two) times daily. (Patient not taking: Reported on 01/03/2021)  . [DISCONTINUED] tiZANidine (ZANAFLEX) 4 MG capsule Take 1 capsule (4 mg total) by mouth at bedtime as needed for muscle spasms. (Patient not taking: Reported on 01/03/2021)   No facility-administered  encounter medications on file as of 01/03/2021.     ROS: Gen: no fever, chills  Skin: no rash, itching ENT: no ear pain, ear drainage, nasal congestion, rhinorrhea, sinus pressure, sore throat Eyes: no blurry vision, double vision Resp: no cough, wheeze,SOB Breast: no breast tenderness, no nipple discharge, no breast masses CV: no CP, palpitations, LE edema,  GI: no heartburn, n/v/d/c, abd pain GU: no dysuria, urgency, frequency, hematuria MSK: no joint pain, myalgias, +neck and shoulder pain that is intermittent Neuro: no dizziness, headache, weakness, vertigo Psych: no depression, anxiety, insomnia   No Known Allergies  BP (!) 130/100 (BP Location: Left Arm,  Patient Position: Sitting, Cuff Size: Normal)   Pulse 74   Temp (!) 97.2 F (36.2 C) (Temporal)   Ht 5\' 3"  (1.6 m)   Wt 222 lb (100.7 kg)   LMP 12/25/2020   SpO2 98%   BMI 39.33 kg/m   Wt Readings from Last 3 Encounters:  01/03/21 222 lb (100.7 kg)  02/07/20 240 lb 7 oz (109.1 kg)  07/06/19 215 lb (97.5 kg)   Temp Readings from Last 3 Encounters:  01/03/21 (!) 97.2 F (36.2 C) (Temporal)  09/01/20 99.1 F (37.3 C) (Oral)  02/07/20 98.7 F (37.1 C) (Oral)   BP Readings from Last 3 Encounters:  01/03/21 (!) 130/100  09/01/20 (!) 152/100  02/07/20 120/60   Pulse Readings from Last 3 Encounters:  01/03/21 74  09/01/20 62  02/07/20 90     Physical Exam Constitutional:      General: She is not in acute distress.    Appearance: She is well-developed. She is obese. She is not ill-appearing.  HENT:     Head: Normocephalic and atraumatic.     Right Ear: Tympanic membrane and ear canal normal.     Left Ear: Tympanic membrane and ear canal normal.     Nose: Nose normal.     Mouth/Throat:     Mouth: Mucous membranes are moist.     Pharynx: Oropharynx is clear.  Eyes:     Conjunctiva/sclera: Conjunctivae normal.  Neck:     Thyroid: No thyromegaly.  Cardiovascular:     Rate and Rhythm: Normal rate and regular rhythm.     Heart sounds: Normal heart sounds. No murmur heard.   Pulmonary:     Effort: Pulmonary effort is normal. No respiratory distress.     Breath sounds: Normal breath sounds. No wheezing or rhonchi.  Abdominal:     General: Bowel sounds are normal. There is no distension.     Palpations: Abdomen is soft. There is no mass.     Tenderness: There is no abdominal tenderness.  Musculoskeletal:     Cervical back: Neck supple.     Right lower leg: No edema.     Left lower leg: No edema.  Lymphadenopathy:     Cervical: No cervical adenopathy.  Skin:    General: Skin is warm and dry.  Neurological:     Mental Status: She is alert and oriented to person,  place, and time.     Motor: No abnormal muscle tone.     Coordination: Coordination normal.  Psychiatric:        Behavior: Behavior normal.      A/P:  1. Annual physical exam - discussed importance of regular CV exercise, healthy diet, adequate sleep - UTD on PAP - dental appt scheduled and vision appt UTD - CBC - Basic metabolic panel - AST - ALT - next CPE in 1  year  2. Muscle spasms of neck - intermittent Refill: - cyclobenzaprine (FLEXERIL) 5 MG tablet; Take 1 tablet (5 mg total) by mouth 2 (two) times daily as needed for muscle spasms.  Dispense: 30 tablet; Refill: 1  3. Screening for lipid disorders - Lipid panel  4. Primary hypertension - low sodium diet - stressed importance of regular CV exercise Rx: - amLODipine (NORVASC) 10 MG tablet; Take 1 tablet (10 mg total) by mouth daily.  Dispense: 90 tablet; Refill: 3 - f/u in 2 wks Discussed plan and reviewed medications with patient, including risks, benefits, and potential side effects. Pt expressed understand. All questions answered.    This visit occurred during the SARS-CoV-2 public health emergency.  Safety protocols were in place, including screening questions prior to the visit, additional usage of staff PPE, and extensive cleaning of exam room while observing appropriate contact time as indicated for disinfecting solutions.

## 2021-02-05 ENCOUNTER — Encounter (HOSPITAL_COMMUNITY): Payer: Self-pay | Admitting: Psychiatry

## 2021-02-05 ENCOUNTER — Other Ambulatory Visit: Payer: Self-pay

## 2021-02-05 ENCOUNTER — Telehealth (INDEPENDENT_AMBULATORY_CARE_PROVIDER_SITE_OTHER): Payer: 59 | Admitting: Psychiatry

## 2021-02-05 DIAGNOSIS — F315 Bipolar disorder, current episode depressed, severe, with psychotic features: Secondary | ICD-10-CM

## 2021-02-05 MED ORDER — ARIPIPRAZOLE ER 400 MG IM PRSY: 400.0000 mg | PREFILLED_SYRINGE | INTRAMUSCULAR | 11 refills | Status: DC

## 2021-02-05 MED ORDER — OXCARBAZEPINE 600 MG PO TABS: 600.0000 mg | ORAL_TABLET | Freq: Two times a day (BID) | ORAL | 2 refills | Status: DC

## 2021-02-05 MED ORDER — ARIPIPRAZOLE ER 400 MG IM PRSY
400.0000 mg | PREFILLED_SYRINGE | Freq: Once | INTRAMUSCULAR | Status: AC
  Administered 2021-02-11: 400 mg via INTRAMUSCULAR

## 2021-02-05 NOTE — Progress Notes (Signed)
BH MD/PA/NP OP Progress Note Virtual Visit via Video Note  I connected with Amy Sexton on 02/05/21 at  1:30 PM EDT by a video enabled telemedicine application and verified that I am speaking with the correct person using two identifiers.  Location: Patient: Home Provider: Clinic   I discussed the limitations of evaluation and management by telemedicine and the availability of in person appointments. The patient expressed understanding and agreed to proceed.  I provided 30 inutes of non-face-to-face time during this encounter.    02/05/2021 3:37 PM Amy Sexton  MRN:  [id number]  Chief Complaint: "Work is stressful"  HPI: 35 year old female seen today for follow-up psychiatric evaluation. She has a psychiatric history of Bipolar disorder, ADHD, and tobacco dependence. She is currently managed on trileptal 600 mg twice daily and Abilify 10 mg. She noted that her medications are somewhat effective in managing her psychiatric evaluations.   Today he is well groomed pleasant, cooperative, engaged in conversation, and maintained eye contact. She informed provider that work has been stressful.  She notes that there has been change in management.  She also informed Clinical research associate that she got sick at work.  She notes that she was jittery, dizzy, and had a headache.  She notes that her coworkers called for assistance and noted that her blood pressure was elevated.  She inform Clinical research associate that she followed up with her primary care doctor and was given medications to control her blood pressure.    Patient notes that since last visit her anxiety and depression anxiety and depression has improved since her last visit.  Provider conducted a GAD-7 and patient scored 9, at last visit patient for an 11.  She notes that she worries about her grandmother who was living with her mother but now has to find other housing.  Provider also conducted a PHQ-9 and patient scored an 11, at last visit patient scored 19.     Patient notes that she recently restarted her medications after being off of it for a few weeks and is now feeling more stable.  Provider asked patient if she was interested in starting Abilify injections.  She notes that she was and informed provider that she could come in next week to receive her first injection.  Provider informed patient to continue Abilify 10 mg daily until she is able to receive her injection.  She endorsed understanding and agreed.  No other medication changes made.  Patient agreeable to continue medications as prescribed.  No other concerns at this time.      Visit Diagnosis:    ICD-10-CM   1. Bipolar I disorder, most recent episode (or current) depressed, severe, specified as with psychotic behavior (HCC)  F31.5 oxcarbazepine (TRILEPTAL) 600 MG tablet    Past Psychiatric History: Bipolar disorder, ADHD, and tobacco dependence.  Past Medical History:  Past Medical History:  Diagnosis Date  . ADHD (attention deficit hyperactivity disorder)   . Asthma    No past surgical history on file.  Family Psychiatric History: Denies  Family History:  Family History  Problem Relation Age of Onset  . Hypertension Mother   . Heart disease Maternal Grandmother   . Hyperlipidemia Maternal Grandmother     Social History:  Social History   Socioeconomic History  . Marital status: Single    Spouse name: Not on file  . Number of children: Not on file  . Years of education: Not on file  . Highest education level: Not on file  Occupational History  .  Not on file  Tobacco Use  . Smoking status: Current Every Day Smoker    Packs/day: 0.50    Years: 10.00    Pack years: 5.00    Types: Cigarettes  . Smokeless tobacco: Never Used  Vaping Use  . Vaping Use: Never used  Substance and Sexual Activity  . Alcohol use: Yes    Alcohol/week: 3.0 standard drinks    Types: 1 Glasses of wine, 2 Cans of beer per week  . Drug use: No  . Sexual activity: Yes    Birth  control/protection: Condom  Other Topics Concern  . Not on file  Social History Narrative  . Not on file   Social Determinants of Health   Financial Resource Strain: Not on file  Food Insecurity: Not on file  Transportation Needs: Not on file  Physical Activity: Not on file  Stress: Not on file  Social Connections: Not on file    Allergies: No Known Allergies  Metabolic Disorder Labs: Lab Results  Component Value Date   HGBA1C 5.5 01/25/2016   No results found for: PROLACTIN Lab Results  Component Value Date   CHOL 149 01/03/2021   TRIG 54.0 01/03/2021   HDL 41.60 01/03/2021   CHOLHDL 4 01/03/2021   VLDL 10.8 01/03/2021   LDLCALC 97 01/03/2021   LDLCALC 120 (H) 09/08/2018   Lab Results  Component Value Date   TSH 0.714 03/27/2017   TSH 0.81 01/25/2016    Therapeutic Level Labs: No results found for: LITHIUM No results found for: VALPROATE No components found for:  CBMZ  Current Medications: Current Outpatient Medications  Medication Sig Dispense Refill  . ARIPiprazole ER (ABILIFY MAINTENA) 400 MG PRSY prefilled syringe Inject 400 mg into the muscle every 28 (twenty-eight) days. 1 each 11  . amLODipine (NORVASC) 10 MG tablet Take 1 tablet (10 mg total) by mouth daily. 90 tablet 3  . cyclobenzaprine (FLEXERIL) 5 MG tablet Take 1 tablet (5 mg total) by mouth 2 (two) times daily as needed for muscle spasms. 30 tablet 1  . ibuprofen (ADVIL,MOTRIN) 200 MG tablet Take 200 mg by mouth every 6 (six) hours as needed.    Marland Kitchen oxcarbazepine (TRILEPTAL) 600 MG tablet Take 1 tablet (600 mg total) by mouth 2 (two) times daily. 60 tablet 2   Current Facility-Administered Medications  Medication Dose Route Frequency Provider Last Rate Last Admin  . ARIPiprazole ER (ABILIFY MAINTENA) 400 MG prefilled syringe 400 mg  400 mg Intramuscular Once Shanna Cisco, NP         Musculoskeletal: Strength & Muscle Tone: Unable to assess due to telehealth visit Gait & Station: Unable  to assess due to telehealth visit Patient leans: N/A  Psychiatric Specialty Exam: Review of Systems  There were no vitals taken for this visit.There is no height or weight on file to calculate BMI.  General Appearance: Well Groomed  Eye Contact:  Good  Speech:  Clear and Coherent and Normal Rate  Volume:  Normal  Mood:  Anxious and Depressed  Affect:  Appropriate and Congruent  Thought Process:  Coherent, Goal Directed and Linear  Orientation:  Full (Time, Place, and Person)  Thought Content: Logical and Paranoid Ideation   Suicidal Thoughts:  No  Homicidal Thoughts:  No  Memory:  Immediate;   Good Recent;   Good Remote;   Good  Judgement:  Good  Insight:  Good  Psychomotor Activity:  Normal  Concentration:  Concentration: Good and Attention Span: Good  Recall:  Good  Fund of Knowledge: Good  Language: Good  Akathisia:  No  Handed:  Right  AIMS (if indicated):Not done  Assets:  Communication Skills Desire for Improvement Financial Resources/Insurance Housing Social Support  ADL's:  Intact  Cognition: WNL  Sleep:  Good   Screenings: GAD-7   Flowsheet Row Video Visit from 02/05/2021 in [facility] Video Visit from 11/07/2020 in [facility] Video Visit from 10/03/2020 in [facility] Office Visit from 08/25/2018 in [facility]  Total GAD-7 Score 13 9 11 13     PHQ2-9   Flowsheet Row Video Visit from 02/05/2021 in [facility] Video Visit from 11[facility]07/2020 in [facility] Video Visit from 10/03/2020 in [facility] Office Visit from 08/25/2018 in [facility] Office Visit from 04/25/2017 in [facility] at [facility] Total Score 3 3 5 4 1   PHQ-9 Total Score 13 11 19 12 9        Assessment and Plan: Patient notes that her anxiety and depression has worsened due to life  stressors (her health, her family, and work).  She notes that she recently restarted her medications after being off of it for a few weeks and is now feeling a little more mentally stable.  Provider asked patient if she was interested in starting Abilify injections.  She notes that she was and informed provider that she could come in next week to receive her first injection.  Provider informed patient to continue Abilify 10 mg daily until she is able to receive her injection.  She endorsed understanding and agreed.  No other medication changes made.  Patient agreeable to continue medications as prescribed.    1. Bipolar I disorder, most recent episode (or current) depressed, severe, specified as with psychotic behavior (HCC)  Continue- oxcarbazepine (TRILEPTAL) 600 MG tablet; Take 1 tablet (600 mg total) by mouth 2 (two) times daily.  Dispense: 60 tablet; Refill: 2 - ARIPiprazole ER (ABILIFY MAINTENA) 400 MG prefilled syringe 400 mg Start- ARIPiprazole ER (ABILIFY MAINTENA) 400 MG PRSY prefilled syringe; Inject 400 mg into the muscle every 28 (twenty-eight) days.  Dispense: 1 each; Refill: 11  Follow up in 3 months Follow up with therapy.    [facility], NP 02/05/2021, 3:37 PM

## 2021-02-07 ENCOUNTER — Other Ambulatory Visit (HOSPITAL_COMMUNITY): Payer: Self-pay | Admitting: Psychiatry

## 2021-02-07 ENCOUNTER — Telehealth (HOSPITAL_COMMUNITY): Payer: Self-pay | Admitting: Psychiatry

## 2021-02-07 ENCOUNTER — Telehealth (HOSPITAL_COMMUNITY): Payer: Self-pay

## 2021-02-07 DIAGNOSIS — F315 Bipolar disorder, current episode depressed, severe, with psychotic features: Secondary | ICD-10-CM

## 2021-02-07 MED ORDER — ARIPIPRAZOLE 10 MG PO TABS: 10.0000 mg | ORAL_TABLET | Freq: Every day | ORAL | 2 refills | Status: DC

## 2021-02-07 NOTE — Telephone Encounter (Signed)
Pt called stating abilify shot cost $1k . Pt also does not like taking pills. Please advise.  Pt ph [phone]

## 2021-02-07 NOTE — Telephone Encounter (Signed)
Patient notes that she was able to speak to her insurance company and resources that provider had given her about receiving assistance in getting her Abilify injection.  She informed Clinical research associate that her injection now only cost $40 which she reports she cannot afford.  She notes that she will come and receive her first Abilify injection on Monday4/18/2022.  Nursing staff called patient to schedule this appointment.  No other concerns noted at this time.

## 2021-02-07 NOTE — Telephone Encounter (Signed)
Patient states she was told by her insurance company that her deductible for her Abilify injection would be between $1000 and $1500.  She notes that she does not have that amount of money.  She also notes that after she paid her deductible her medications will be $90 thereafter.  Provider spoke to the [facility] care assistant  [facility] to see what other options could be available.  She notes that the patient could attempt to call [facility] [phone], single care provider line [phone] and patient line [phone].  Provider gave this information to the patient.  Patient notes that she will attempt to call.  Provider filled Abilify 10 mg oral pills to be taken daily so that patient would not have a lapse in her medications.  No other concerns noted at this time.

## 2021-02-07 NOTE — Telephone Encounter (Signed)
Patient called and stated that she would like to speak with you regarding her injection. The number to call her at is [phone]. Thank you

## 2021-02-11 ENCOUNTER — Other Ambulatory Visit: Payer: Self-pay

## 2021-02-11 ENCOUNTER — Ambulatory Visit (HOSPITAL_COMMUNITY): Payer: 59 | Admitting: *Deleted

## 2021-02-11 ENCOUNTER — Encounter (HOSPITAL_COMMUNITY): Payer: Self-pay

## 2021-02-11 VITALS — BP 127/99 | HR 87 | Ht 63.0 in | Wt 223.0 lb

## 2021-02-11 DIAGNOSIS — F315 Bipolar disorder, current episode depressed, severe, with psychotic features: Secondary | ICD-10-CM

## 2021-02-11 NOTE — Progress Notes (Signed)
First injection here at this clinic. She had formerly been on oral Abilify but she has difficulty being consistant with oral meds and states her provider said she might be able to come off the trileptal once she is established on the [place]. She was able to get her shot at [place] but states she is unsure how she will get it in the future, not sure how it got down to 40.00. Lorane Gell, provider gave her a card from Abilify to make it affordable with her [employer]. Should be able to continue this for one year, per [place]. She is bright, pleasant, appropriately verbal. She states her job is very stressful to her and provided details on why is stresses her out. She was recently started on BP med by her PCP, but she also found out hypertension runs in her family. She offers no complaints. Education provided on the injection. She verbalized her understanding. Abilify M 400 mg given today in her R deltoid without difficulty. To return in one month.

## 2021-02-21 ENCOUNTER — Encounter: Payer: Self-pay | Admitting: Family Medicine

## 2021-02-21 DIAGNOSIS — I10 Essential (primary) hypertension: Secondary | ICD-10-CM

## 2021-02-28 MED ORDER — HYDROCHLOROTHIAZIDE 12.5 MG PO CAPS: 12.5000 mg | ORAL_CAPSULE | Freq: Every day | ORAL | 1 refills | Status: DC

## 2021-03-04 ENCOUNTER — Emergency Department (HOSPITAL_COMMUNITY)
Admission: EM | Admit: 2021-03-04 | Discharge: 2021-03-04 | Disposition: A | Discharge status: Home / Self Care | Payer: Managed Care, Other (non HMO) | Attending: Emergency Medicine | Admitting: Emergency Medicine

## 2021-03-04 ENCOUNTER — Emergency Department (HOSPITAL_COMMUNITY): Payer: Managed Care, Other (non HMO)

## 2021-03-04 ENCOUNTER — Other Ambulatory Visit: Payer: Self-pay

## 2021-03-04 ENCOUNTER — Encounter (HOSPITAL_COMMUNITY): Payer: Self-pay

## 2021-03-04 DIAGNOSIS — R079 Chest pain, unspecified: Secondary | ICD-10-CM

## 2021-03-04 DIAGNOSIS — Z79899 Other long term (current) drug therapy: Secondary | ICD-10-CM | POA: Diagnosis not present

## 2021-03-04 DIAGNOSIS — J45909 Unspecified asthma, uncomplicated: Secondary | ICD-10-CM | POA: Insufficient documentation

## 2021-03-04 DIAGNOSIS — R072 Precordial pain: Secondary | ICD-10-CM | POA: Diagnosis present

## 2021-03-04 DIAGNOSIS — H8110 Benign paroxysmal vertigo, unspecified ear: Secondary | ICD-10-CM | POA: Insufficient documentation

## 2021-03-04 DIAGNOSIS — R42 Dizziness and giddiness: Secondary | ICD-10-CM | POA: Insufficient documentation

## 2021-03-04 DIAGNOSIS — F17210 Nicotine dependence, cigarettes, uncomplicated: Secondary | ICD-10-CM | POA: Diagnosis not present

## 2021-03-04 DIAGNOSIS — R0602 Shortness of breath: Secondary | ICD-10-CM | POA: Diagnosis not present

## 2021-03-04 LAB — CBC WITH DIFFERENTIAL/PLATELET
Abs Immature Granulocytes: 0.03 10*3/uL (ref 0.00–0.07)
Basophils Absolute: 0 10*3/uL (ref 0.0–0.1)
Basophils Relative: 0 %
Eosinophils Absolute: 0.4 10*3/uL (ref 0.0–0.5)
Eosinophils Relative: 4 %
HCT: 38.5 % (ref 36.0–46.0)
Hemoglobin: 11.7 g/dL — ABNORMAL LOW (ref 12.0–15.0)
Immature Granulocytes: 0 %
Lymphocytes Relative: 39 %
Lymphs Abs: 3.6 10*3/uL (ref 0.7–4.0)
MCH: 21.7 pg — ABNORMAL LOW (ref 26.0–34.0)
MCHC: 30.4 g/dL (ref 30.0–36.0)
MCV: 71.4 fL — ABNORMAL LOW (ref 80.0–100.0)
Monocytes Absolute: 0.8 10*3/uL (ref 0.1–1.0)
Monocytes Relative: 8 %
Neutro Abs: 4.4 10*3/uL (ref 1.7–7.7)
Neutrophils Relative %: 49 %
Platelets: 226 10*3/uL (ref 150–400)
RBC: 5.39 MIL/uL — ABNORMAL HIGH (ref 3.87–5.11)
RDW: 16.9 % — ABNORMAL HIGH (ref 11.5–15.5)
WBC: 9.2 10*3/uL (ref 4.0–10.5)
nRBC: 0 % (ref 0.0–0.2)

## 2021-03-04 LAB — BASIC METABOLIC PANEL
Anion gap: 6 (ref 5–15)
BUN: 11 mg/dL (ref 6–20)
CO2: 26 mmol/L (ref 22–32)
Calcium: 9 mg/dL (ref 8.9–10.3)
Chloride: 105 mmol/L (ref 98–111)
Creatinine, Ser: 0.75 mg/dL (ref 0.44–1.00)
GFR, Estimated: >60 mL/min (ref >60)
Glucose, Bld: 99 mg/dL (ref 70–99)
Potassium: 3.8 mmol/L (ref 3.5–5.1)
Sodium: 137 mmol/L (ref 135–145)

## 2021-03-04 LAB — TROPONIN I (HIGH SENSITIVITY)
Troponin I (High Sensitivity): 3 ng/L (ref <18)
Troponin I (High Sensitivity): 3 ng/L (ref <18)

## 2021-03-04 LAB — I-STAT BETA HCG BLOOD, ED (MC, WL, AP ONLY): I-stat hCG, quantitative: <5 m[IU]/mL (ref <5)

## 2021-03-04 MED ORDER — MECLIZINE HCL 25 MG PO TABS: 25.0000 mg | ORAL_TABLET | Freq: Three times a day (TID) | ORAL | 0 refills | Status: DC | PRN

## 2021-03-04 MED ORDER — MECLIZINE HCL 25 MG PO TABS
25.0000 mg | ORAL_TABLET | Freq: Once | ORAL | Status: AC
  Administered 2021-03-04: 25 mg via ORAL
  Filled 2021-03-04: qty 1

## 2021-03-04 MED ORDER — NAPROXEN 500 MG PO TABS: 500.0000 mg | ORAL_TABLET | Freq: Two times a day (BID) | ORAL | 0 refills | Status: DC

## 2021-03-04 MED ORDER — KETOROLAC TROMETHAMINE 30 MG/ML IJ SOLN
30.0000 mg | Freq: Once | INTRAMUSCULAR | Status: AC
  Administered 2021-03-04: 30 mg via INTRAVENOUS
  Filled 2021-03-04: qty 1

## 2021-03-04 NOTE — ED Notes (Signed)
Patient transported to X-ray 

## 2021-03-04 NOTE — ED Provider Notes (Signed)
[facility]-EMERGENCY DEPT Provider Note   CSN: [id number] Arrival date & time: 03/04/21  0559     History Chief Complaint  Patient presents with  . Chest Pain    Amy Sexton is a 35 y.o. female.  Amy Sexton is a 35 y.o. female withPMH of hypertension and asthma, presents with 3 hours of substernal chest pain. Pain described as a pressure that is sometimes sharp. Pain radiates some to the back, no radiation elsewhere. Pain is not exertional or pleuritic, is worse with some movements and palpation. Patient reports associated shortness of breath and dizziness. .She notes the shortness of breath is exertional, dizziness only when sitting up or standing up. denies HA, vision changes, n/v. Did not take any medications prior to arrival. Pt is a current smoke, denies other drug use. Reports no cardiac history. No prior hx of blood clots, no recent travel, surgeries or hospitalizations. BP elevated, saw primary care recently and was prescribed an additional BP medication that has not been picked up yet.        Past Medical History:  Diagnosis Date  . ADHD (attention deficit hyperactivity disorder)   . Asthma     Patient Active Problem List   Diagnosis Date Noted  . Bipolar I disorder, most recent episode (or current) depressed, severe, specified as with psychotic behavior (HCC) 05/30/2020  . BMI 40.0-44.9, adult (HCC) 09/08/2018  . Tobacco use disorder 09/08/2018    History reviewed. No pertinent surgical history.   OB History   No obstetric history on file.     Family History  Problem Relation Age of Onset  . Hypertension Mother   . Heart disease Maternal Grandmother   . Hyperlipidemia Maternal Grandmother     Social History   Tobacco Use  . Smoking status: Current Every Day Smoker    Packs/day: 0.50    Years: 10.00    Pack years: 5.00    Types: Cigarettes  . Smokeless tobacco: Never Used  Vaping Use  . Vaping Use: Never used   Substance Use Topics  . Alcohol use: Yes    Alcohol/week: 3.0 standard drinks    Types: 1 Glasses of wine, 2 Cans of beer per week  . Drug use: No    Home Medications Prior to Admission medications   Medication Sig Start Date End Date Taking? Authorizing Provider  meclizine (ANTIVERT) 25 MG tablet Take 1 tablet (25 mg total) by mouth 3 (three) times daily as needed for dizziness. 03/04/21  Yes Dartha Lodge, PA-C  naproxen (NAPROSYN) 500 MG tablet Take 1 tablet (500 mg total) by mouth 2 (two) times daily. 03/04/21  Yes Dartha Lodge, PA-C  amLODipine (NORVASC) 10 MG tablet Take 1 tablet (10 mg total) by mouth daily. 01/03/21   Cirigliano, Mary K, DO  ARIPiprazole (ABILIFY) 10 MG tablet Take 1 tablet (10 mg total) by mouth daily. 02/07/21   Shanna Cisco, NP  ARIPiprazole ER (ABILIFY MAINTENA) 400 MG PRSY prefilled syringe Inject 400 mg into the muscle every 28 (twenty-eight) days. 02/05/21   Shanna Cisco, NP  cyclobenzaprine (FLEXERIL) 5 MG tablet Take 1 tablet (5 mg total) by mouth 2 (two) times daily as needed for muscle spasms. 01/03/21   Cirigliano, Jearld Lesch, DO  hydrochlorothiazide (MICROZIDE) 12.5 MG capsule Take 1 capsule (12.5 mg total) by mouth daily. 02/28/21   Cirigliano, Jearld Lesch, DO  ibuprofen (ADVIL,MOTRIN) 200 MG tablet Take 200 mg by mouth every 6 (six) hours as needed.  [provider]  oxcarbazepine (TRILEPTAL) 600 MG tablet Take 1 tablet (600 mg total) by mouth 2 (two) times daily. 02/05/21   Shanna Cisco, NP    Allergies    Patient has no known allergies.  Review of Systems   Review of Systems  Constitutional: Negative for chills and fever.  HENT: Negative.   Eyes: Negative for visual disturbance.  Respiratory: Positive for shortness of breath.   Cardiovascular: Positive for chest pain.  Gastrointestinal: Negative for abdominal pain, nausea and vomiting.  Musculoskeletal: Negative for arthralgias and myalgias.  Skin: Negative for color change,  rash and wound.  Neurological: Positive for dizziness. Negative for syncope, weakness, light-headedness, numbness and headaches.  All other systems reviewed and are negative.   Physical Exam Updated Vital Signs BP (!) 135/96   Pulse 85   Temp 98.1 F (36.7 C)   Resp 17   Ht 5\' 3"  (1.6 m)   Wt 101.2 kg   SpO2 100%   BMI 39.52 kg/m   Physical Exam Vitals and nursing note reviewed.  Constitutional:      General: She is not in acute distress.    Appearance: She is well-developed. She is not ill-appearing or diaphoretic.  HENT:     Head: Normocephalic and atraumatic.  Eyes:     General:        Right eye: No discharge.        Left eye: No discharge.     Pupils: Pupils are equal, round, and reactive to light.  Cardiovascular:     Rate and Rhythm: Normal rate and regular rhythm.     Pulses:          Radial pulses are 2+ on the right side and 2+ on the left side.     Heart sounds: Normal heart sounds. No murmur heard. No friction rub. No gallop.   Pulmonary:     Effort: Pulmonary effort is normal. No respiratory distress.     Breath sounds: Normal breath sounds. No wheezing or rales.     Comments: Respirations equal and unlabored, patient able to speak in full sentences, lungs clear to auscultation bilaterally Chest:     Chest wall: Tenderness present.  Abdominal:     General: Bowel sounds are normal. There is no distension or abdominal bruit.     Palpations: Abdomen is soft. There is no mass.     Tenderness: There is no abdominal tenderness. There is no guarding.     Comments: Abdomen soft, nondistended, nontender to palpation in all quadrants without guarding or peritoneal signs  Musculoskeletal:        General: No deformity.     Cervical back: Neck supple.     Right lower leg: No tenderness. No edema.     Left lower leg: No tenderness. No edema.  Skin:    General: Skin is warm and dry.     Capillary Refill: Capillary refill takes less than 2 seconds.  Neurological:      Mental Status: She is alert.     Coordination: Coordination normal.     Comments: Speech is clear, able to follow commands Moves extremities without ataxia, coordination intact  Psychiatric:        Mood and Affect: Mood normal.        Behavior: Behavior normal.     ED Results / Procedures / Treatments   Labs (all labs ordered are listed, but only abnormal results are displayed) Labs Reviewed  CBC WITH DIFFERENTIAL/PLATELET - Abnormal;  Notable for the following components:      Result Value   RBC 5.39 (*)    Hemoglobin 11.7 (*)    MCV 71.4 (*)    MCH 21.7 (*)    RDW 16.9 (*)    All other components within normal limits  BASIC METABOLIC PANEL  I-STAT BETA HCG BLOOD, ED ([facility], [facility], [facility] ONLY)  TROPONIN I (HIGH SENSITIVITY)  TROPONIN I (HIGH SENSITIVITY)    EKG EKG Interpretation  Date/Time:  Monday Mar 04 2021 06:15:04 EDT Ventricular Rate:  80 PR Interval:  164 QRS Duration: 77 QT Interval:  365 QTC Calculation: 421 R Axis:   25 Text Interpretation: Sinus rhythm No significant change since last tracing Confirmed by Benjiman Core [phone]) on 03/04/2021 6:29:14 AM   Radiology DG Chest 2 View  Result Date: 03/04/2021 CLINICAL DATA:  New onset mid chest pain 3 hours ago Current smoker EXAM: CHEST - 2 VIEW COMPARISON:  11/23/2018 FINDINGS: The heart size and mediastinal contours are within normal limits. Both lungs are clear. The visualized skeletal structures are unremarkable. IMPRESSION: No active cardiopulmonary disease. Electronically Signed   By: Acquanetta Belling M.D.   On: 03/04/2021 07:29    Procedures Procedures   Medications Ordered in ED Medications  ketorolac (TORADOL) 30 MG/ML injection 30 mg (30 mg Intravenous Given 03/04/21 0807)  meclizine (ANTIVERT) tablet 25 mg (25 mg Oral Given 03/04/21 1027)    ED Course  I have reviewed the triage vital signs and the nursing notes.  Pertinent labs & imaging results that were available during my care of the patient were  reviewed by me and considered in my medical decision making (see chart for details).    MDM Rules/Calculators/A&P                          Patient presents to the emergency department with chest pain. Patient nontoxic appearing, in no apparent distress, vitals without significant abnormality. Fairly benign physical exam. Pt does report some dizziness, but this is positional with no other neuro symptoms  DDX: ACS, pulmonary embolism, dissection, pneumothorax, pneumonia, arrhythmia, severe anemia, MSK, GERD, anxiety, abdominal process.   Additional history obtained:  Additional history obtained from chart review & nursing note review.   EKG: sinus rhythm no concerning ischemic changes  Lab Tests:  I Ordered, reviewed, and interpreted labs, which included:  CBC: no leukocytosis, stable Hgb BMP: No electrolyte derangements, normal renal function Troponin: neg x 2  Imaging Studies ordered:  I ordered imaging studies which included CXR, I independently reviewed, formal radiology impression shows:  No active cardiopulmonary disease  ED Course:   EKG without obvious acute ischemia, delta troponin negative, doubt ACS. Patient is low risk wells, PERC negative, doubt pulmonary embolism. Pain is not a tearing sensation, symmetric pulses, no widening of mediastinum on CXR, doubt dissection. Cardiac monitor reviewed, no notable arrhythmias or tachycardia. Patient has appeared hemodynamically stable throughout ER visit and appears safe for discharge with close PCP/cardiology follow up. I discussed results, treatment plan, need for PCP follow-up, and return precautions with the patient. Provided opportunity for questions, patient confirmed understanding and is in agreement with plan.    Portions of this note were generated with Scientist, clinical (histocompatibility and immunogenetics). Dictation errors may occur despite best attempts at proofreading.   Final Clinical Impression(s) / ED Diagnoses Final diagnoses:  Central chest  pain  Benign paroxysmal positional vertigo, unspecified laterality    Rx / DC Orders ED Discharge Orders  Ordered    naproxen (NAPROSYN) 500 MG tablet  2 times daily        03/04/21 1121    meclizine (ANTIVERT) 25 MG tablet  3 times daily PRN        03/04/21 1121           Dartha LodgeFord, Myonna Chisom N, [place]PA-C 03/05/21 2140    Wynetta FinesMessick, Peter C, MD 03/06/21 1623

## 2021-03-04 NOTE — Discharge Instructions (Signed)
Your evaluation regarding chest pain today was reassuring.  Suspect this is chest wall pain or costochondritis.  Take naproxen twice daily regularly for at least the next 1 to 2 weeks.  You can use Tylenol as needed for additional pain or over-the-counter Salonpas lidocaine patches.  Your dizziness seems to be positional, you can use meclizine as needed and do the exercises provided in your paperwork today to help with this.  If dizziness persist please follow-up with your primary care doctor or with ENT.

## 2021-03-04 NOTE — ED Triage Notes (Signed)
Pt reports sternal chest pains beginning approximately 3 hours prior to arrival while resting. Also c/o on going HTN for several days around 140/100. Pt seen and given additional BP medication but has not picked it up from pharmacy yet.

## 2021-03-11 ENCOUNTER — Ambulatory Visit (HOSPITAL_COMMUNITY): Payer: 59

## 2021-03-12 ENCOUNTER — Ambulatory Visit (HOSPITAL_COMMUNITY): Payer: 59 | Admitting: *Deleted

## 2021-03-12 ENCOUNTER — Encounter (HOSPITAL_COMMUNITY): Payer: Self-pay

## 2021-03-12 ENCOUNTER — Other Ambulatory Visit: Payer: Self-pay

## 2021-03-12 VITALS — BP 111/80 | HR 81 | Ht 63.0 in | Wt 227.0 lb

## 2021-03-12 DIAGNOSIS — F315 Bipolar disorder, current episode depressed, severe, with psychotic features: Secondary | ICD-10-CM

## 2021-03-12 MED ORDER — ARIPIPRAZOLE ER 400 MG IM PRSY
400.0000 mg | PREFILLED_SYRINGE | INTRAMUSCULAR | Status: AC
Start: 2021-03-12 — End: 2021-10-22
  Administered 2021-03-12 – 2021-07-11 (×5): 400 mg via INTRAMUSCULAR

## 2021-03-12 NOTE — Progress Notes (Signed)
In a day late for her Abilify M 400 mg injection. Today she got it in her L deltoid without any difficulty. She called yesterday pm to reschedule her missed appt and was given praise for how responsible she was in managing her missed appt. She states the only change she could tell after getting her first injection is she is" mostly numb from emotion." States she feels like "whatever". She is neutral about how she feels, couldn't say if it was a good or bad feeling. She denies any psychosis and denies SI or HI. She cut her hours down to 8 qd from 12, her job is stressful to her, believe the decrease in hours is a positive for her. Her birthday is next week and much discussion about what she would or wouldn't be doing for her day. She has not drank any ETOH in months which is a big positive for her, states it got boring drinking alone. To return in one month for her next shot. She brings her shot as she has insurance.

## 2021-04-08 ENCOUNTER — Encounter: Payer: Self-pay | Admitting: Family Medicine

## 2021-04-09 ENCOUNTER — Ambulatory Visit (HOSPITAL_COMMUNITY): Payer: 59 | Admitting: *Deleted

## 2021-04-09 ENCOUNTER — Encounter (HOSPITAL_COMMUNITY): Payer: Self-pay

## 2021-04-09 ENCOUNTER — Other Ambulatory Visit: Payer: Self-pay

## 2021-04-09 VITALS — BP 114/86 | HR 91 | Ht 63.0 in | Wt 230.0 lb

## 2021-04-09 DIAGNOSIS — F315 Bipolar disorder, current episode depressed, severe, with psychotic features: Secondary | ICD-10-CM

## 2021-04-09 NOTE — Progress Notes (Signed)
In as scheduled for her monthly injection of Abilify M 400 mg, given today in her R deltoid without difficulty. She has lost 4 lbs since last visit without trying to lose weight, thought her medicine might be making her lose weight. She is pleasant and animated appropriately. She continues to dislike her job but stays because she can pay her bills and buy things she wants, today she is focused on buying a big new TV. No complaints offered. To return in one month for her next injection.

## 2021-04-10 ENCOUNTER — Ambulatory Visit (INDEPENDENT_AMBULATORY_CARE_PROVIDER_SITE_OTHER): Payer: Managed Care, Other (non HMO) | Admitting: Nurse Practitioner

## 2021-04-10 ENCOUNTER — Encounter: Payer: Self-pay | Admitting: Nurse Practitioner

## 2021-04-10 VITALS — BP 104/74 | HR 74 | Temp 98.1°F | Ht 63.0 in | Wt 231.2 lb

## 2021-04-10 DIAGNOSIS — I10 Essential (primary) hypertension: Secondary | ICD-10-CM | POA: Diagnosis not present

## 2021-04-10 NOTE — Patient Instructions (Signed)
Normal BP today, no medication changes needed at this time Maintain DASh diet, adequate oral hydration, and quit tobacco use Monitor BP once a day in AM and record. Bring BP reading and machine to next OV F/up with Dr. Salena Saner in 2weeks

## 2021-04-10 NOTE — Progress Notes (Signed)
Subjective:  Patient ID: Amy Sexton, female    DOB: 18-Jan-1986  Age: 35 y.o. MRN: [id number]  CC: Hypertension  Hypertension  Amy Sexton reports an episode of elevated BP on Sunday (154/119) associated with dizziness, headache and chest tightness. Symptoms resolved with rest. Onset of intermittent symptoms 2weeks ago. Always resolves spontaneously. Amy Sexton denies any new medication or change in medication dose. Amy Sexton smokes 1ppd and is working on cutting down. States Amy Sexton tries to maintain Low sodium diet. Amy Sexton denies those symptoms today.  BP Readings from Last 3 Encounters:  04/10/21 104/74  03/04/21 (!) 110/92  01/03/21 (!) 130/100    Reviewed past Medical, Social and Family history today.  Outpatient Medications Prior to Visit  Medication Sig Dispense Refill   amLODipine (NORVASC) 10 MG tablet Take 1 tablet (10 mg total) by mouth daily. 90 tablet 3   ARIPiprazole ER (ABILIFY MAINTENA) 400 MG PRSY prefilled syringe Inject 400 mg into the muscle every 28 (twenty-eight) days. 1 each 11   cyclobenzaprine (FLEXERIL) 5 MG tablet Take 1 tablet (5 mg total) by mouth 2 (two) times daily as needed for muscle spasms. 30 tablet 1   hydrochlorothiazide (MICROZIDE) 12.5 MG capsule Take 1 capsule (12.5 mg total) by mouth daily. 90 capsule 1   ibuprofen (ADVIL,MOTRIN) 200 MG tablet Take 200 mg by mouth every 6 (six) hours as needed.     meclizine (ANTIVERT) 25 MG tablet Take 1 tablet (25 mg total) by mouth 3 (three) times daily as needed for dizziness. 30 tablet 0   naproxen (NAPROSYN) 500 MG tablet Take 1 tablet (500 mg total) by mouth 2 (two) times daily. 30 tablet 0   oxcarbazepine (TRILEPTAL) 600 MG tablet Take 1 tablet (600 mg total) by mouth 2 (two) times daily. 60 tablet 2   ARIPiprazole (ABILIFY) 10 MG tablet Take 1 tablet (10 mg total) by mouth daily. 30 tablet 2   Facility-Administered Medications Prior to Visit  Medication Dose Route Frequency Provider Last Rate Last Admin   ARIPiprazole  ER (ABILIFY MAINTENA) 400 MG prefilled syringe 400 mg  400 mg Intramuscular Q28 days Toy Cookey E, NP   400 mg at 04/09/21 1032    ROS See HPI  Objective:  BP 104/74   Pulse 74   Temp 98.1 F (36.7 C)   Ht 5\' 3"  (1.6 m)   Wt 231 lb 3.2 oz (104.9 kg)   SpO2 99%   BMI 40.96 kg/m   Physical Exam Constitutional:      General: Amy Sexton is not in acute distress. Cardiovascular:     Rate and Rhythm: Normal rate.     Pulses: Normal pulses.  Pulmonary:     Effort: Pulmonary effort is normal.  Neurological:     Mental Status: Amy Sexton is alert and oriented to person, place, and time.   Assessment & Plan:  This visit occurred during the SARS-CoV-2 public health emergency.  Safety protocols were in place, including screening questions prior to the visit, additional usage of staff PPE, and extensive cleaning of exam room while observing appropriate contact time as indicated for disinfecting solutions.   Amy Sexton was seen today for hypertension.  Diagnoses and all orders for this visit:  Primary hypertension Normal BP today, no medication changes needed at this time Maintain DASh diet, adequate oral hydration, and quit tobacco use. Monitor BP once a day in AM and record. Bring BP reading and machine to next OV F/up with Dr. Michael Boston in 2weeks  Problem List Items Addressed This  Visit       Cardiovascular and Mediastinum   Primary hypertension - Primary     Follow-up: Return in about 2 weeks (around 04/24/2021) for HTN with Dr. Darletta Moll, NP

## 2021-04-24 ENCOUNTER — Other Ambulatory Visit: Payer: Self-pay

## 2021-04-24 ENCOUNTER — Ambulatory Visit (INDEPENDENT_AMBULATORY_CARE_PROVIDER_SITE_OTHER): Payer: Managed Care, Other (non HMO) | Admitting: Family Medicine

## 2021-04-24 ENCOUNTER — Encounter: Payer: Self-pay | Admitting: Family Medicine

## 2021-04-24 VITALS — BP 122/70 | HR 92 | Temp 97.2°F | Wt 233.6 lb

## 2021-04-24 DIAGNOSIS — I10 Essential (primary) hypertension: Secondary | ICD-10-CM

## 2021-04-24 NOTE — Progress Notes (Signed)
Chief Complaint  Patient presents with   Follow-up    2 weeks f/u of HTN Pt has been checking BP at home and states the highest it has been is 149/102, Pt states last week she was sent home from work due to elevated BP.     HPI: Amy Sexton is a 35 y.o. female here for HTN follow-up. Pt is on norvasc 10mg  daily and HCTZ 12.5mg  daily. She does have BP cuff at home but has not been checking BP at home the past 2 wks.  She states BP at work was elevated. Can't recall SBP but DBP was 112.  She notes increased headaches. No CP, SOB, dizziness.  Diet: no change, has not had more take-out or processed foods Work has become more stressful - lots of new employees, different roles/responsibilities each day  BP Readings from Last 3 Encounters:  04/24/21 122/70  04/10/21 104/74  03/04/21 (!) 110/92   Lab Results  Component Value Date   CREATININE 0.75 03/04/2021   BUN 11 03/04/2021   NA 137 03/04/2021   K 3.8 03/04/2021   CL 105 03/04/2021   CO2 26 03/04/2021    Past Medical History:  Diagnosis Date   ADHD (attention deficit hyperactivity disorder)    Asthma     History reviewed. No pertinent surgical history.  Social History   Socioeconomic History   Marital status: Single    Spouse name: Not on file   Number of children: Not on file   Years of education: Not on file   Highest education level: Not on file  Occupational History   Not on file  Tobacco Use   Smoking status: Every Day    Packs/day: 0.50    Years: 10.00    Pack years: 5.00    Types: Cigarettes   Smokeless tobacco: Never  Vaping Use   Vaping Use: Never used  Substance and Sexual Activity   Alcohol use: Yes    Alcohol/week: 3.0 standard drinks    Types: 1 Glasses of wine, 2 Cans of beer per week   Drug use: No   Sexual activity: Yes    Birth control/protection: Condom  Other Topics Concern   Not on file  Social History Narrative   Not on file   Social Determinants of Health   Financial  Resource Strain: Not on file  Food Insecurity: Not on file  Transportation Needs: Not on file  Physical Activity: Not on file  Stress: Not on file  Social Connections: Not on file  Intimate Partner Violence: Not on file    Family History  Problem Relation Age of Onset   Hypertension Mother    Heart disease Maternal Grandmother    Hyperlipidemia Maternal Grandmother      Immunization History  Administered Date(s) Administered   Influenza-Unspecified 08/04/2017, 07/21/2018   PFIZER(Purple Top)SARS-COV-2 Vaccination 07/06/2020, 07/27/2020, 03/30/2021   Tdap 01/30/2013    Outpatient Encounter Medications as of 04/24/2021  Medication Sig   amLODipine (NORVASC) 10 MG tablet Take 1 tablet (10 mg total) by mouth daily.   ARIPiprazole ER (ABILIFY MAINTENA) 400 MG PRSY prefilled syringe Inject 400 mg into the muscle every 28 (twenty-eight) days.   hydrochlorothiazide (MICROZIDE) 12.5 MG capsule Take 1 capsule (12.5 mg total) by mouth daily.   ibuprofen (ADVIL,MOTRIN) 200 MG tablet Take 200 mg by mouth every 6 (six) hours as needed.   naproxen (NAPROSYN) 500 MG tablet Take 1 tablet (500 mg total) by mouth 2 (two) times daily.  oxcarbazepine (TRILEPTAL) 600 MG tablet Take 1 tablet (600 mg total) by mouth 2 (two) times daily.   cyclobenzaprine (FLEXERIL) 5 MG tablet Take 1 tablet (5 mg total) by mouth 2 (two) times daily as needed for muscle spasms. (Patient not taking: Reported on 04/24/2021)   meclizine (ANTIVERT) 25 MG tablet Take 1 tablet (25 mg total) by mouth 3 (three) times daily as needed for dizziness. (Patient not taking: Reported on 04/24/2021)   Facility-Administered Encounter Medications as of 04/24/2021  Medication   ARIPiprazole ER (ABILIFY MAINTENA) 400 MG prefilled syringe 400 mg     ROS: Pertinent positives and negatives noted in HPI. Remainder of ROS non-contributory   No Known Allergies  BP 122/70 (BP Location: Left Arm, Patient Position: Sitting, Cuff Size: Large)    Pulse 92   Temp (!) 97.2 F (36.2 C) (Temporal)   Wt 233 lb 9.6 oz (106 kg)   SpO2 97%   BMI 41.38 kg/m   Physical Exam Constitutional:      General: She is not in acute distress.    Appearance: Normal appearance. She is not ill-appearing.  Cardiovascular:     Rate and Rhythm: Normal rate and regular rhythm.     Pulses: Normal pulses.  Pulmonary:     Effort: No respiratory distress.  Musculoskeletal:     Right lower leg: No edema.     Left lower leg: No edema.  Neurological:     Mental Status: She is alert and oriented to person, place, and time.  Psychiatric:        Mood and Affect: Mood normal.        Behavior: Behavior normal.     A/P:  1. Primary hypertension - normal BP today and at OV 2 wks ago - cont current meds - amlodipine 10mg  daily and HCTZ 12.5mg  daily - check BP daily x 1 week - send readings in 1 wk via MyChart - will increase dose  of HCTZ if readings above goal - cont low sodium diet  This visit occurred during the SARS-CoV-2 public health emergency.  Safety protocols were in place, including screening questions prior to the visit, additional usage of staff PPE, and extensive cleaning of exam room while observing appropriate contact time as indicated for disinfecting solutions.

## 2021-04-24 NOTE — Patient Instructions (Addendum)
Check your blood pressure once per day when you wake up Keep a record or log Send mychart message in 1-1.5 weeks with the readings Goal is under 140 / under 90 Low sodium diet   BP Readings from Last 3 Encounters:  04/24/21 122/70  04/10/21 104/74  03/04/21 (!) 110/92

## 2021-05-06 ENCOUNTER — Other Ambulatory Visit: Payer: Self-pay

## 2021-05-06 ENCOUNTER — Encounter (HOSPITAL_COMMUNITY): Payer: Self-pay | Admitting: Psychiatry

## 2021-05-06 ENCOUNTER — Ambulatory Visit (INDEPENDENT_AMBULATORY_CARE_PROVIDER_SITE_OTHER): Payer: 59 | Admitting: Psychiatry

## 2021-05-06 ENCOUNTER — Encounter (HOSPITAL_COMMUNITY): Payer: Self-pay

## 2021-05-06 ENCOUNTER — Ambulatory Visit (INDEPENDENT_AMBULATORY_CARE_PROVIDER_SITE_OTHER): Payer: 59

## 2021-05-06 DIAGNOSIS — F411 Generalized anxiety disorder: Secondary | ICD-10-CM

## 2021-05-06 DIAGNOSIS — F315 Bipolar disorder, current episode depressed, severe, with psychotic features: Secondary | ICD-10-CM

## 2021-05-06 MED ORDER — HYDROXYZINE HCL 10 MG PO TABS: 10.0000 mg | ORAL_TABLET | Freq: Three times a day (TID) | ORAL | 2 refills | Status: DC | PRN

## 2021-05-06 MED ORDER — TRAZODONE HCL 50 MG PO TABS: 50.0000 mg | ORAL_TABLET | Freq: Every evening | ORAL | 2 refills | Status: DC | PRN

## 2021-05-06 MED ORDER — ARIPIPRAZOLE ER 400 MG IM PRSY: 400.0000 mg | PREFILLED_SYRINGE | INTRAMUSCULAR | 11 refills | Status: DC

## 2021-05-06 MED ORDER — OXCARBAZEPINE 600 MG PO TABS: 600.0000 mg | ORAL_TABLET | Freq: Two times a day (BID) | ORAL | 2 refills | Status: DC

## 2021-05-06 NOTE — Patient Instructions (Signed)
Patient presented today for Abilify Maintena 400mg  injection. Patient was pleasant and cooperative upon approach. Patient was given due injection in her LEFT deltoid. Patient to return in 1 month for next due injection

## 2021-05-06 NOTE — Progress Notes (Signed)
BH MD/PA/NP OP Progress Note     05/06/2021 4:19 PM Amy Sexton  MRN:  [id number]  Chief Complaint: "  I am thinking about quitting my job"  HPI: 35 year old female seen today for follow-up psychiatric evaluation. She has a psychiatric history of Bipolar disorder, ADHD, and tobacco dependence. She is currently managed on trileptal 600 mg twice daily and Abilify Maintena 400 mg monthly injection.  She noted that her medications are somewhat effective in managing her psychiatric evaluations.    Today she is well groomed pleasant, cooperative, engaged in conversation, and maintained eye contact. She informed provider that he is considering quitting her job.  She notes that her superior is inappropriate with her at times.  She notes that she fears going to human resources because of retaliation and potentially being fired.  She notes that she is looking for a different position but is skeptical that she will receive the pay that she currently receives at her job.  She notes that she gets paid $21 hourly and notes that this helps maintain her lifestyle.   Patient notes that above exacerbates her anxiety and depression.  Today provider conducted a GAD-7 and patient scored a 16, at her last visit she scored a 9.  She notes that she worries about her grandmother, her job, and her finances.  She informed Clinical research associate that her grandmother is currently living with her cousin in [place] however she notes that she helps care for her by taking her things that she needs daily.  Provider also conducted a PHQ-9 and patient scored a 13, at her last visit she scored an 11.  She notes that her paranoia has improved since being on Abilify.  At times she notes her mood fluctuates and she has impulsive spending.  She denies other symptoms of mania.  Today she denies SI/HI/VAH.  Patient notes her sleep continues to be poor due to her work schedule.  Patient works third shift.  She notes that she sleeps approximately 3 hours.   She informed Clinical research associate that she has a past prescription of trazodone however notes that she does not take it because she fears that she will not wake up for work.   Today she is agreeable to starting hydroxyzine 10 mg 3 times daily to help manage symptoms of anxiety.  She is also agreeable to restarting trazodone 25 to 50 mg daily as needed for sleep. Potential side effects of medication and risks vs benefits of treatment vs non-treatment were explained and discussed. All questions were answered. She will continue all medications as prescribed.      Visit Diagnosis:    ICD-10-CM   1. Generalized anxiety disorder  F41.1 hydrOXYzine (ATARAX/VISTARIL) 10 MG tablet    2. Bipolar I disorder, most recent episode (or current) depressed, severe, specified as with psychotic behavior (HCC)  F31.5 oxcarbazepine (TRILEPTAL) 600 MG tablet    ARIPiprazole ER (ABILIFY MAINTENA) 400 MG PRSY prefilled syringe    traZODone (DESYREL) 50 MG tablet      Past Psychiatric History: Bipolar disorder, ADHD, and tobacco dependence.  Past Medical History:  Past Medical History:  Diagnosis Date   ADHD (attention deficit hyperactivity disorder)    Asthma    History reviewed. No pertinent surgical history.  Family Psychiatric History: Denies  Family History:  Family History  Problem Relation Age of Onset   Hypertension Mother    Heart disease Maternal Grandmother    Hyperlipidemia Maternal Grandmother     Social History:  Social History  Socioeconomic History   Marital status: Single    Spouse name: Not on file   Number of children: Not on file   Years of education: Not on file   Highest education level: Not on file  Occupational History   Not on file  Tobacco Use   Smoking status: Every Day    Packs/day: 0.50    Years: 10.00    Pack years: 5.00    Types: Cigarettes   Smokeless tobacco: Never  Vaping Use   Vaping Use: Never used  Substance and Sexual Activity   Alcohol use: Yes     Alcohol/week: 3.0 standard drinks    Types: 1 Glasses of wine, 2 Cans of beer per week   Drug use: No   Sexual activity: Yes    Birth control/protection: Condom  Other Topics Concern   Not on file  Social History Narrative   Not on file   Social Determinants of Health   Financial Resource Strain: Not on file  Food Insecurity: Not on file  Transportation Needs: Not on file  Physical Activity: Not on file  Stress: Not on file  Social Connections: Not on file    Allergies: No Known Allergies  Metabolic Disorder Labs: Lab Results  Component Value Date   HGBA1C 5.5 01/25/2016   No results found for: PROLACTIN Lab Results  Component Value Date   CHOL 149 01/03/2021   TRIG 54.0 01/03/2021   HDL 41.60 01/03/2021   CHOLHDL 4 01/03/2021   VLDL 10.8 01/03/2021   LDLCALC 97 01/03/2021   LDLCALC 120 (H) 09/08/2018   Lab Results  Component Value Date   TSH 0.714 03/27/2017   TSH 0.81 01/25/2016    Therapeutic Level Labs: No results found for: LITHIUM No results found for: VALPROATE No components found for:  CBMZ  Current Medications: Current Outpatient Medications  Medication Sig Dispense Refill   hydrOXYzine (ATARAX/VISTARIL) 10 MG tablet Take 1 tablet (10 mg total) by mouth 3 (three) times daily as needed. 90 tablet 2   traZODone (DESYREL) 50 MG tablet Take 1 tablet (50 mg total) by mouth at bedtime as needed for sleep. 30 tablet 2   amLODipine (NORVASC) 10 MG tablet Take 1 tablet (10 mg total) by mouth daily. 90 tablet 3   ARIPiprazole ER (ABILIFY MAINTENA) 400 MG PRSY prefilled syringe Inject 400 mg into the muscle every 28 (twenty-eight) days. 1 each 11   cyclobenzaprine (FLEXERIL) 5 MG tablet Take 1 tablet (5 mg total) by mouth 2 (two) times daily as needed for muscle spasms. (Patient not taking: Reported on 04/24/2021) 30 tablet 1   hydrochlorothiazide (MICROZIDE) 12.5 MG capsule Take 1 capsule (12.5 mg total) by mouth daily. 90 capsule 1   ibuprofen (ADVIL,MOTRIN)  200 MG tablet Take 200 mg by mouth every 6 (six) hours as needed.     meclizine (ANTIVERT) 25 MG tablet Take 1 tablet (25 mg total) by mouth 3 (three) times daily as needed for dizziness. (Patient not taking: Reported on 04/24/2021) 30 tablet 0   naproxen (NAPROSYN) 500 MG tablet Take 1 tablet (500 mg total) by mouth 2 (two) times daily. 30 tablet 0   oxcarbazepine (TRILEPTAL) 600 MG tablet Take 1 tablet (600 mg total) by mouth 2 (two) times daily. 60 tablet 2   Current Facility-Administered Medications  Medication Dose Route Frequency Provider Last Rate Last Admin   ARIPiprazole ER (ABILIFY MAINTENA) 400 MG prefilled syringe 400 mg  400 mg Intramuscular Q28 days Shanna CiscoParsons, Analyse Angst E, NP  400 mg at 04/09/21 1032     Musculoskeletal: Strength & Muscle Tone:  Unable to assess due to telehealth visit Gait & Station:  Unable to assess due to telehealth visit Patient leans: N/A  Psychiatric Specialty Exam: Review of Systems  There were no vitals taken for this visit.There is no height or weight on file to calculate BMI.  General Appearance: Well Groomed  Eye Contact:  Good  Speech:  Clear and Coherent and Normal Rate  Volume:  Normal  Mood:  Anxious and Depressed  Affect:  Appropriate and Congruent  Thought Process:  Coherent, Goal Directed and Linear  Orientation:  Full (Time, Place, and Person)  Thought Content: WDL and Logical   Suicidal Thoughts:  No  Homicidal Thoughts:  No  Memory:  Immediate;   Good Recent;   Good Remote;   Good  Judgement:  Good  Insight:  Good  Psychomotor Activity:  Normal  Concentration:  Concentration: Good and Attention Span: Good  Recall:  Good  Fund of Knowledge: Good  Language: Good  Akathisia:  No  Handed:  Right  AIMS (if indicated):Not done  Assets:  Communication Skills Desire for Improvement Financial Resources/Insurance Housing Social Support  ADL's:  Intact  Cognition: WNL  Sleep:  Poor   Screenings: GAD-7    Flowsheet Row  Clinical Support from 05/06/2021 in [facility] Video Visit from 02/05/2021 in [facility] Video Visit from 11/07/2020 in [facility] Video Visit from 10/03/2020 in [facility] Office Visit from 08/25/2018 in [facility]  Total GAD-7 Score 16 13 9 11 13       PHQ2-9    Flowsheet Row Clinical Support from 05/06/2021 in [facility] Video Visit from 02/05/2021 in [facility] Video Visit from 11/07/2020 in [facility] Video Visit from 10/03/2020 in [facility] Office Visit from 08/25/2018 in [facility]  PHQ-2 Total Score 4 3 3 5 4   PHQ-9 Total Score 13 13 11 19 12       Flowsheet Row Clinical Support from 05/06/2021 in [facility] ED from 03/04/2021 in [facility]  C-SSRS RISK CATEGORY No Risk No Risk        Assessment and Plan: Patient notes that her anxiety and depression has worsened due to life stressors (her health, her family, and work).  Her mood however and paranoia has improved.  She also informed writer that her sleep.  Today she is agreeable to starting trazodone 50 mg nightly as needed to help manage anxiety, depression and sleep.  She is also agreeable to starting hydroxyzine 10 mg 3 times daily to help manage anxiety.  She will continue medications as prescribed.    1. Bipolar I disorder, most recent episode (or current) depressed, severe, specified as with psychotic behavior (HCC)  Continue- oxcarbazepine (TRILEPTAL) 600 MG tablet; Take 1 tablet (600 mg total) by mouth 2 (two) times daily.  Dispense: 60 tablet; Refill: 2 Start- ARIPiprazole ER (ABILIFY MAINTENA) 400 MG PRSY prefilled syringe; Inject 400 mg into the muscle every 28 (twenty-eight) days.   Dispense: 1 each; Refill: 11 Start- traZODone (DESYREL) 50 MG tablet; Take 1 tablet (50 mg total) by mouth at bedtime as needed for sleep.  Dispense: 30 tablet; Refill: 2  2. Generalized anxiety disorder  Start- hydrOXYzine (ATARAX/VISTARIL) 10 MG tablet; Take 1 tablet (10  mg total) by mouth 3 (three) times daily as needed.  Dispense: 90 tablet; Refill: 2    Follow up in 3 months Follow up with therapy.     Shanna Cisco, NP 05/06/2021, 4:19 PM

## 2021-06-06 ENCOUNTER — Other Ambulatory Visit: Payer: Self-pay

## 2021-06-06 ENCOUNTER — Encounter (HOSPITAL_COMMUNITY): Payer: Self-pay

## 2021-06-06 ENCOUNTER — Ambulatory Visit (HOSPITAL_COMMUNITY): Payer: 59 | Admitting: *Deleted

## 2021-06-06 VITALS — BP 133/89 | HR 78 | Ht 62.0 in | Wt 232.0 lb

## 2021-06-06 DIAGNOSIS — F315 Bipolar disorder, current episode depressed, severe, with psychotic features: Secondary | ICD-10-CM

## 2021-06-06 NOTE — Progress Notes (Signed)
In as scheduled for her monthly medication. She got her Abilify M 400 mg injection in her L DELTOID without difficulty. She states she feels the shot has helped her with her irritability. SHe denies any sx of psychosis. She continues to work and states she doesn't get as upset about the stuff that goes on at work because of her medicine. She offers no complaints. SHe is pleasant and verbal. SHe has a family reunion this weekend. She is to return in one month for her next injection.

## 2021-07-10 ENCOUNTER — Encounter: Payer: Self-pay | Admitting: Family Medicine

## 2021-07-10 DIAGNOSIS — M0579 Rheumatoid arthritis with rheumatoid factor of multiple sites without organ or systems involvement: Secondary | ICD-10-CM | POA: Insufficient documentation

## 2021-07-10 DIAGNOSIS — M2550 Pain in unspecified joint: Secondary | ICD-10-CM | POA: Insufficient documentation

## 2021-07-11 ENCOUNTER — Ambulatory Visit (HOSPITAL_COMMUNITY): Payer: 59 | Admitting: *Deleted

## 2021-07-11 ENCOUNTER — Other Ambulatory Visit: Payer: Self-pay

## 2021-07-11 ENCOUNTER — Encounter (HOSPITAL_COMMUNITY): Payer: Self-pay

## 2021-07-11 VITALS — BP 141/99 | HR 76 | Ht 62.0 in | Wt 242.0 lb

## 2021-07-11 DIAGNOSIS — F315 Bipolar disorder, current episode depressed, severe, with psychotic features: Secondary | ICD-10-CM

## 2021-07-11 NOTE — Progress Notes (Signed)
Patient in for her monthly injection of Abilify M 400 mg. She got her shot today in her R DELTOID without difficulty. She is her pleasant, verbal and happy self. 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 No complaints offered. She denies any sx of her illness. She is stable at this time. She is to return in one month for her next injection.

## 2021-08-05 ENCOUNTER — Ambulatory Visit (INDEPENDENT_AMBULATORY_CARE_PROVIDER_SITE_OTHER): Payer: 59 | Admitting: Psychiatry

## 2021-08-05 ENCOUNTER — Other Ambulatory Visit: Payer: Self-pay

## 2021-08-05 ENCOUNTER — Encounter (HOSPITAL_COMMUNITY): Payer: Self-pay | Admitting: Psychiatry

## 2021-08-05 DIAGNOSIS — F315 Bipolar disorder, current episode depressed, severe, with psychotic features: Secondary | ICD-10-CM

## 2021-08-05 DIAGNOSIS — F411 Generalized anxiety disorder: Secondary | ICD-10-CM

## 2021-08-05 MED ORDER — ARIPIPRAZOLE ER 400 MG IM PRSY: 400.0000 mg | PREFILLED_SYRINGE | INTRAMUSCULAR | 11 refills | Status: DC

## 2021-08-05 MED ORDER — HYDROXYZINE HCL 10 MG PO TABS: 10.0000 mg | ORAL_TABLET | Freq: Three times a day (TID) | ORAL | 3 refills | Status: DC | PRN

## 2021-08-05 MED ORDER — TRAZODONE HCL 50 MG PO TABS: 50.0000 mg | ORAL_TABLET | Freq: Every evening | ORAL | 3 refills | Status: DC | PRN

## 2021-08-05 NOTE — Progress Notes (Signed)
BH MD/PA/NP OP Progress Note     05/06/2021 4:19 PM Amy Sexton  MRN:  [id number]  Chief Complaint: "I am doing okay"  HPI: 35 year old female seen today for follow-up psychiatric evaluation. She has a psychiatric history of Bipolar disorder, ADHD, and tobacco dependence. She is currently managed on trileptal 600 mg twice daily and Abilify Maintena 400 mg monthly injection.  She noted that her medications are somewhat effective in managing her psychiatric evaluations.  She informed Clinical research associate that she takes hydroxyzine and trazodone periodically and informed Clinical research associate that she does not know the last time she took Trileptal   Today she is well groomed pleasant, cooperative, engaged in conversation, and maintained eye contact. She informed provider that she has been doing okay.  She does note that her sleep continues to be poor however informed Clinical research associate that she has not started her trazodone as she forgets to take it.  She also notes that she takes hydroxyzine infrequently and has not taken Trileptal in months.  Patient notes that work continues to be hectic.  She informed Clinical research associate that other stressors includes her car breaking down and the recent death of her friend.  She informed Clinical research associate that she dislikes asking people for assistance and notes that when she was without a car she was struggling.  Patient informed writer that her anxiety and depression are well managed.  Provider conducted a GAD-7 and patient scored a 14, her last visit she scored a 16.  Provider also conducted PHQ-9 and patient scored a 13, at her last visit she scored a 13.  She endorses adequate appetite.  Today she denies SI/HI/VH, mania, or paranoia.   At this time patient notes that her mood is stable and reports she does not want to restart Trileptal.  She will take trazodone and hydroxyzine as needed and continues to receive her Abilify injections monthly.  No other concerns noted at this time.         Visit Diagnosis:     ICD-10-CM   1. Generalized anxiety disorder  F41.1 hydrOXYzine (ATARAX/VISTARIL) 10 MG tablet    2. Bipolar I disorder, most recent episode (or current) depressed, severe, specified as with psychotic behavior (HCC)  F31.5 oxcarbazepine (TRILEPTAL) 600 MG tablet    ARIPiprazole ER (ABILIFY MAINTENA) 400 MG PRSY prefilled syringe    traZODone (DESYREL) 50 MG tablet      Past Psychiatric History: Bipolar disorder, ADHD, and tobacco dependence.  Past Medical History:  Past Medical History:  Diagnosis Date   ADHD (attention deficit hyperactivity disorder)    Asthma    History reviewed. No pertinent surgical history.  Family Psychiatric History: Denies  Family History:  Family History  Problem Relation Age of Onset   Hypertension Mother    Heart disease Maternal Grandmother    Hyperlipidemia Maternal Grandmother     Social History:  Social History   Socioeconomic History   Marital status: Single    Spouse name: Not on file   Number of children: Not on file   Years of education: Not on file   Highest education level: Not on file  Occupational History   Not on file  Tobacco Use   Smoking status: Every Day    Packs/day: 0.50    Years: 10.00    Pack years: 5.00    Types: Cigarettes   Smokeless tobacco: Never  Vaping Use   Vaping Use: Never used  Substance and Sexual Activity   Alcohol use: Yes    Alcohol/week: 3.0 standard  drinks    Types: 1 Glasses of wine, 2 Cans of beer per week   Drug use: No   Sexual activity: Yes    Birth control/protection: Condom  Other Topics Concern   Not on file  Social History Narrative   Not on file   Social Determinants of Health   Financial Resource Strain: Not on file  Food Insecurity: Not on file  Transportation Needs: Not on file  Physical Activity: Not on file  Stress: Not on file  Social Connections: Not on file    Allergies: No Known Allergies  Metabolic Disorder Labs: Lab Results  Component Value Date   HGBA1C 5.5  01/25/2016   No results found for: PROLACTIN Lab Results  Component Value Date   CHOL 149 01/03/2021   TRIG 54.0 01/03/2021   HDL 41.60 01/03/2021   CHOLHDL 4 01/03/2021   VLDL 10.8 01/03/2021   LDLCALC 97 01/03/2021   LDLCALC 120 (H) 09/08/2018   Lab Results  Component Value Date   TSH 0.714 03/27/2017   TSH 0.81 01/25/2016    Therapeutic Level Labs: No results found for: LITHIUM No results found for: VALPROATE No components found for:  CBMZ  Current Medications: Current Outpatient Medications  Medication Sig Dispense Refill   hydrOXYzine (ATARAX/VISTARIL) 10 MG tablet Take 1 tablet (10 mg total) by mouth 3 (three) times daily as needed. 90 tablet 2   traZODone (DESYREL) 50 MG tablet Take 1 tablet (50 mg total) by mouth at bedtime as needed for sleep. 30 tablet 2   amLODipine (NORVASC) 10 MG tablet Take 1 tablet (10 mg total) by mouth daily. 90 tablet 3   ARIPiprazole ER (ABILIFY MAINTENA) 400 MG PRSY prefilled syringe Inject 400 mg into the muscle every 28 (twenty-eight) days. 1 each 11   cyclobenzaprine (FLEXERIL) 5 MG tablet Take 1 tablet (5 mg total) by mouth 2 (two) times daily as needed for muscle spasms. (Patient not taking: Reported on 04/24/2021) 30 tablet 1   hydrochlorothiazide (MICROZIDE) 12.5 MG capsule Take 1 capsule (12.5 mg total) by mouth daily. 90 capsule 1   ibuprofen (ADVIL,MOTRIN) 200 MG tablet Take 200 mg by mouth every 6 (six) hours as needed.     meclizine (ANTIVERT) 25 MG tablet Take 1 tablet (25 mg total) by mouth 3 (three) times daily as needed for dizziness. (Patient not taking: Reported on 04/24/2021) 30 tablet 0   naproxen (NAPROSYN) 500 MG tablet Take 1 tablet (500 mg total) by mouth 2 (two) times daily. 30 tablet 0   oxcarbazepine (TRILEPTAL) 600 MG tablet Take 1 tablet (600 mg total) by mouth 2 (two) times daily. 60 tablet 2   Current Facility-Administered Medications  Medication Dose Route Frequency Provider Last Rate Last Admin   ARIPiprazole  ER (ABILIFY MAINTENA) 400 MG prefilled syringe 400 mg  400 mg Intramuscular Q28 days Toy Cookey E, NP   400 mg at 04/09/21 1032     Musculoskeletal: Strength & Muscle Tone: within normal limits Gait & Station: normal Patient leans: N/A  Psychiatric Specialty Exam: Review of Systems  There were no vitals taken for this visit.There is no height or weight on file to calculate BMI.  General Appearance: Well Groomed  Eye Contact:  Good  Speech:  Clear and Coherent and Normal Rate  Volume:  Normal  Mood:  Anxious and Depressed, however reports is able to cope with  Affect:  Appropriate and Congruent  Thought Process:  Coherent, Goal Directed and Linear  Orientation:  Full (Time,  Place, and Person)  Thought Content: WDL and Logical   Suicidal Thoughts:  No  Homicidal Thoughts:  No  Memory:  Immediate;   Good Recent;   Good Remote;   Good  Judgement:  Good  Insight:  Good  Psychomotor Activity:  Normal  Concentration:  Concentration: Good and Attention Span: Good  Recall:  Good  Fund of Knowledge: Good  Language: Good  Akathisia:  No  Handed:  Right  AIMS (if indicated):Not done  Assets:  Communication Skills Desire for Improvement Financial Resources/Insurance Housing Social Support  ADL's:  Intact  Cognition: WNL  Sleep:  Poor   Screenings: GAD-7    Flowsheet Row Clinical Support from 05/06/2021 in [facility] Video Visit from 02/05/2021 in [facility] Video Visit from 11/07/2020 in [facility] Video Visit from 10/03/2020 in [facility] Office Visit from 08/25/2018 in [facility]  Total GAD-7 Score 16 13 9 11 13       PHQ2-9    Flowsheet Row Clinical Support from 05/06/2021 in [facility] Video Visit from 02/05/2021 in [facility] Video Visit from 11/07/2020 in [facility] Video Visit from 10/03/2020 in [facility] Office Visit from 08/25/2018 in [facility]  PHQ-2 Total Score 4 3 3 5 4   PHQ-9 Total Score 13 13 11 19 12       Flowsheet Row Clinical Support from 05/06/2021 in [facility] ED from 03/04/2021 in [facility] [facility]  C-SSRS RISK CATEGORY No Risk No Risk        Assessment and Plan: Patient endorses symptoms of anxiety, depression, and insomnia.  She informed 07/07/2021 that she is able to cope with her anxiety and depression. At this time patient notes that her mood is stable and reports she does not want to restart Trileptal.  She will take trazodone and hydroxyzine as needed and continues to receive her Abilify injections monthly.    1. Bipolar I disorder, most recent episode (or current) depressed, severe, specified as with psychotic behavior (HCC)  Continue- ARIPiprazole ER (ABILIFY MAINTENA) 400 MG PRSY prefilled syringe; Inject 400 mg into the muscle every 28 (twenty-eight) days.  Dispense: 1 each; Refill: 11 Continue- traZODone (DESYREL) 50 MG tablet; Take 1 tablet (50 mg total) by mouth at bedtime as needed for sleep.  Dispense: 30 tablet; Refill: 3  2. Generalized anxiety disorder  Continue- hydrOXYzine (ATARAX/VISTARIL) 10 MG tablet; Take 1 tablet (10 mg total) by mouth 3 (three) times daily as needed.  Dispense: 90 tablet; Refill: 3   Follow up in 3 months Follow up with therapy.     [facility], NP 05/06/2021, 4:19 PM

## 2021-08-08 ENCOUNTER — Ambulatory Visit (HOSPITAL_COMMUNITY): Payer: 59 | Admitting: *Deleted

## 2021-08-08 ENCOUNTER — Other Ambulatory Visit: Payer: Self-pay

## 2021-08-08 ENCOUNTER — Encounter (HOSPITAL_COMMUNITY): Payer: Self-pay

## 2021-08-08 VITALS — BP 139/96 | HR 72 | Ht 62.0 in | Wt 237.0 lb

## 2021-08-08 DIAGNOSIS — F315 Bipolar disorder, current episode depressed, severe, with psychotic features: Secondary | ICD-10-CM

## 2021-08-08 MED ORDER — ARIPIPRAZOLE ER 400 MG IM PRSY
400.0000 mg | PREFILLED_SYRINGE | INTRAMUSCULAR | Status: DC
Start: 2021-08-08 — End: 2024-04-07
  Administered 2021-08-08 – 2024-03-24 (×33): 400 mg via INTRAMUSCULAR

## 2021-08-08 NOTE — Progress Notes (Signed)
In as scheduled for her monthly injection of Abilify M 400 mg. Today her shot was given in her L DELTOID. She states she is doing well, just tired from working. She is pleasant and appropriate. No complaints offered. She is to return in one month for her next injection.

## 2021-09-05 ENCOUNTER — Other Ambulatory Visit: Payer: Self-pay

## 2021-09-05 ENCOUNTER — Encounter (HOSPITAL_COMMUNITY): Payer: Self-pay

## 2021-09-05 ENCOUNTER — Ambulatory Visit (INDEPENDENT_AMBULATORY_CARE_PROVIDER_SITE_OTHER): Payer: 59 | Admitting: *Deleted

## 2021-09-05 VITALS — BP 127/94 | HR 87 | Ht 62.0 in | Wt 231.0 lb

## 2021-09-05 DIAGNOSIS — F315 Bipolar disorder, current episode depressed, severe, with psychotic features: Secondary | ICD-10-CM | POA: Diagnosis not present

## 2021-09-05 NOTE — Progress Notes (Signed)
PATIENT ARRIVED FOR MONTHLY INJECTION ARIPiprazole ER (ABILIFY MAINTENA) 400 MG. TOLERATED INJECTION WELL IN Right-Arm. PATIENT DID SHARE THAT ROOM WILL BE MOVING IN OUT IN December SO THINGS WILL BE TIGHT.

## 2021-10-03 ENCOUNTER — Ambulatory Visit (HOSPITAL_COMMUNITY): Payer: 59 | Admitting: *Deleted

## 2021-10-03 ENCOUNTER — Other Ambulatory Visit: Payer: Self-pay

## 2021-10-03 ENCOUNTER — Encounter (HOSPITAL_COMMUNITY): Payer: Self-pay

## 2021-10-03 VITALS — BP 119/87 | HR 86 | Ht 62.0 in | Wt 231.0 lb

## 2021-10-03 DIAGNOSIS — F315 Bipolar disorder, current episode depressed, severe, with psychotic features: Secondary | ICD-10-CM

## 2021-10-03 NOTE — Progress Notes (Signed)
In as scheduled for her monthly Abilify M 400 mg injection. Today she was given her shot in her R DELTOID without difficulty. 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 She still has her roommate but she is still planning on moving out. She has no specific plans for the Christmas Holiday. She offers no complaints, and denies any sx of her illness. She is to return in one month for her next injection.

## 2021-10-30 ENCOUNTER — Telehealth (HOSPITAL_COMMUNITY): Payer: Self-pay | Admitting: *Deleted

## 2021-10-30 NOTE — Telephone Encounter (Signed)
Call from patient to report her copay for her insurance last month was 10.00 with her [employer] card and now its 190 with her insurance and her co pay card which she cant afford. Spoke with our pharmacist downstairs and I can give her a sample tomorrow and Beth our pharmacist will consider other options going forward. Notified patient to come in tomorrow for her shot.

## 2021-10-31 ENCOUNTER — Ambulatory Visit (HOSPITAL_COMMUNITY): Payer: 59 | Admitting: *Deleted

## 2021-10-31 ENCOUNTER — Other Ambulatory Visit: Payer: Self-pay

## 2021-10-31 ENCOUNTER — Encounter (HOSPITAL_COMMUNITY): Payer: Self-pay

## 2021-10-31 VITALS — BP 114/82 | HR 75 | Ht 62.0 in | Wt 235.0 lb

## 2021-10-31 DIAGNOSIS — F315 Bipolar disorder, current episode depressed, severe, with psychotic features: Secondary | ICD-10-CM

## 2021-10-31 NOTE — Progress Notes (Signed)
In as scheduled for her monthly injection of Abilify M 400 mg, Today she got her shot in her R DELTOID without difficulty. She continues to work, doesn't really like her job but she is used to it. Her Grandmother is going to be moving in with her now that her roommate has moved out. This is less her decision and more the families decision but said "it could be all right" Her insurance changed in the new year and it makes her shot 190.00 which she cant afford. Per our pharmacy we will continue seeing her and using samples for her shot. She states she doesn't know about her co pay, states work didn't tell her about that. She is to return in one month for her next injection.

## 2021-11-06 ENCOUNTER — Encounter (HOSPITAL_COMMUNITY): Payer: Self-pay | Admitting: Psychiatry

## 2021-11-06 ENCOUNTER — Telehealth (INDEPENDENT_AMBULATORY_CARE_PROVIDER_SITE_OTHER): Payer: 59 | Admitting: Psychiatry

## 2021-11-06 DIAGNOSIS — F411 Generalized anxiety disorder: Secondary | ICD-10-CM

## 2021-11-06 DIAGNOSIS — F319 Bipolar disorder, unspecified: Secondary | ICD-10-CM

## 2021-11-06 MED ORDER — TRAZODONE HCL 50 MG PO TABS: 50.0000 mg | ORAL_TABLET | Freq: Every evening | ORAL | 3 refills | Status: DC | PRN

## 2021-11-06 MED ORDER — ARIPIPRAZOLE ER 400 MG IM PRSY: 400.0000 mg | PREFILLED_SYRINGE | INTRAMUSCULAR | 11 refills | Status: DC

## 2021-11-06 NOTE — Progress Notes (Signed)
BH MD/PA/NP OP Progress Note     11/06/2021 1:24 PM Amy Sexton  MRN:  [id number]  Chief Complaint: "I am doing okay"  HPI: 36 year old female seen today for follow-up psychiatric evaluation. She has a psychiatric history of Bipolar disorder, ADHD, and tobacco dependence. She is currently managed on hydroxyzine 10 mg three times daily, Trazodone 50 mg, and Abilify Maintena 400 mg monthly injection.  She noted that her medications are somewhat effective in managing her psychiatric evaluations.     Today she is well groomed pleasant, cooperative, engaged in conversation, and maintained eye contact. She informed provider that she has been doing okay.  She note that at time she is stressed with her new living situation. She reports that  her roommate moved out and she her grandmother lives with her.  She notes that her grandmother gets frustrated easily when she stays in her room does not drive her to where she needs to go.  She also notes that work continues to be bothersome. She reports that she would like to work first shift but notes that there is no availability.  Patient informed Clinical research associate that she continues to have poor sleep noting that she sleeps 4 to 5 hours.  She notes that she has only taking trazodone once or twice (after not sleeping for 2 days) and has never started hydroxyzine.   Patient notes that the above exacerbates her anxiety and depression.  Provider conducted a GAD-7 and patient scored a 12, at her last visit she scored a 14.  Provider also conducted PHQ-9 he scored a 12, at her last visit she scored a 13.  She endorses adequate appetite. Today she denies SI/HI/VAH, mania, or paranoia.   At this time patient notes would like to discontinue hydroxyzine.  She will continue her other medications as prescribed.  Patient informed Clinical research associate that her insurance changed and notes that she no longer can afford her Abilify injections.  Per nursing staff samples were given to patient and will  continue to be given to patient and tell the medication that is affordable.  No other concerns at this time.         Visit Diagnosis:    ICD-10-CM   1. Bipolar I disorder, most recent episode (or current) depressed, severe, specified as with psychotic behavior (HCC)  F31.5 traZODone (DESYREL) 50 MG tablet    ARIPiprazole ER (ABILIFY MAINTENA) 400 MG PRSY prefilled syringe      Past Psychiatric History: Bipolar disorder, ADHD, and tobacco dependence.  Past Medical History:  Past Medical History:  Diagnosis Date   ADHD (attention deficit hyperactivity disorder)    Asthma    No past surgical history on file.  Family Psychiatric History: Denies  Family History:  Family History  Problem Relation Age of Onset   Hypertension Mother    Heart disease Maternal Grandmother    Hyperlipidemia Maternal Grandmother     Social History:  Social History   Socioeconomic History   Marital status: Single    Spouse name: Not on file   Number of children: Not on file   Years of education: Not on file   Highest education level: Not on file  Occupational History   Not on file  Tobacco Use   Smoking status: Every Day    Packs/day: 0.50    Years: 10.00    Pack years: 5.00    Types: Cigarettes   Smokeless tobacco: Never  Vaping Use   Vaping Use: Never used  Substance and Sexual  Activity   Alcohol use: Yes    Alcohol/week: 3.0 standard drinks    Types: 1 Glasses of wine, 2 Cans of beer per week   Drug use: No   Sexual activity: Yes    Birth control/protection: Condom  Other Topics Concern   Not on file  Social History Narrative   Not on file   Social Determinants of Health   Financial Resource Strain: Not on file  Food Insecurity: Not on file  Transportation Needs: Not on file  Physical Activity: Not on file  Stress: Not on file  Social Connections: Not on file    Allergies: No Known Allergies  Metabolic Disorder Labs: Lab Results  Component Value Date   HGBA1C 5.5  01/25/2016   No results found for: PROLACTIN Lab Results  Component Value Date   CHOL 149 01/03/2021   TRIG 54.0 01/03/2021   HDL 41.60 01/03/2021   CHOLHDL 4 01/03/2021   VLDL 10.8 01/03/2021   LDLCALC 97 01/03/2021   LDLCALC 120 (H) 09/08/2018   Lab Results  Component Value Date   TSH 0.714 03/27/2017   TSH 0.81 01/25/2016    Therapeutic Level Labs: No results found for: LITHIUM No results found for: VALPROATE No components found for:  CBMZ  Current Medications: Current Outpatient Medications  Medication Sig Dispense Refill   amLODipine (NORVASC) 10 MG tablet Take 1 tablet (10 mg total) by mouth daily. 90 tablet 3   ARIPiprazole ER (ABILIFY MAINTENA) 400 MG PRSY prefilled syringe Inject 400 mg into the muscle every 28 (twenty-eight) days. 1 each 11   cyclobenzaprine (FLEXERIL) 5 MG tablet Take 1 tablet (5 mg total) by mouth 2 (two) times daily as needed for muscle spasms. 30 tablet 1   hydrochlorothiazide (MICROZIDE) 12.5 MG capsule Take 1 capsule (12.5 mg total) by mouth daily. 90 capsule 1   ibuprofen (ADVIL,MOTRIN) 200 MG tablet Take 200 mg by mouth every 6 (six) hours as needed.     meclizine (ANTIVERT) 25 MG tablet Take 1 tablet (25 mg total) by mouth 3 (three) times daily as needed for dizziness. 30 tablet 0   naproxen (NAPROSYN) 500 MG tablet Take 1 tablet (500 mg total) by mouth 2 (two) times daily. 30 tablet 0   traZODone (DESYREL) 50 MG tablet Take 1 tablet (50 mg total) by mouth at bedtime as needed for sleep. 30 tablet 3   Current Facility-Administered Medications  Medication Dose Route Frequency Provider Last Rate Last Admin   ARIPiprazole ER (ABILIFY MAINTENA) 400 MG prefilled syringe 400 mg  400 mg Intramuscular Q28 days Toy Cookey E, NP   400 mg at 10/31/21 1526     Musculoskeletal: Strength & Muscle Tone: within normal limits and Telehealth visit Gait & Station: normal, Telephealth visit Patient leans: N/A  Psychiatric Specialty Exam: Review  of Systems  There were no vitals taken for this visit.There is no height or weight on file to calculate BMI.  General Appearance: Well Groomed  Eye Contact:  Good  Speech:  Clear and Coherent and Normal Rate  Volume:  Normal  Mood:  Euphoric,   Affect:  Appropriate and Congruent  Thought Process:  Coherent, Goal Directed and Linear  Orientation:  Full (Time, Place, and Person)  Thought Content: WDL and Logical   Suicidal Thoughts:  No  Homicidal Thoughts:  No  Memory:  Immediate;   Good Recent;   Good Remote;   Good  Judgement:  Good  Insight:  Good  Psychomotor Activity:  Normal  Concentration:  Concentration: Good and Attention Span: Good  Recall:  Good  Fund of Knowledge: Good  Language: Good  Akathisia:  No  Handed:  Right  AIMS (if indicated):Not done  Assets:  Communication Skills Desire for Improvement Financial Resources/Insurance Housing Social Support  ADL's:  Intact  Cognition: WNL  Sleep:  Poor   Screenings: GAD-7    Flowsheet Row [facility]  Total GAD-7 Score 12 14 16 13 9       PHQ2-9    Flowsheet Row [facility]  PHQ-2 Total Score 3 3 4 3 3   PHQ-9 Total Score 12 13 13 13 11       Flowsheet Row Video [facility]/06[facility]20[facility]  C-SSRS RISK CATEGORY No Risk No Risk No Risk        Assessment and Plan: Patient endorses symptoms insomnia.  She notes at times she is anxious regarding her current living situation however informed writer that she is able to cope with it.  Patient informed Clinical research associatewriter that she is only taking trazodone once or twice.  Provider encouraged patient to take trazodone as prescribed to help manage sleep. At this time patient notes would like to discontinue hydroxyzine.  She will continue her other medications as prescribed.  Patient informed Clinical research associatewriter that her insurance changed and notes that she no longer can afford her Abilify injections.  Per nursing staff samples were given to patient and will continue to be given to patient and tell the medication that is affordable.   1. Bipolar I disorder (HCC)  Continue- traZODone (DESYREL) 50 MG tablet; Take 1 tablet (50 mg total) by mouth at bedtime as needed for sleep.  Dispense: 30 tablet; Refill: 3 Continue- ARIPiprazole ER (ABILIFY MAINTENA) 400 MG PRSY prefilled syringe; Inject 400 mg into the muscle every 28 (twenty-eight) days.  Dispense: 1 each; Refill: 11  2. Generalized anxiety disorder  Continue- traZODone (DESYREL) 50 MG tablet; Take 1 tablet (50 mg total) by mouth at bedtime as needed for sleep.  Dispense: 30 tablet; Refill: 3      Follow up in 3 months Follow up with therapy.     Shanna CiscoBrittney E Nioka Thorington, NP 11/06/2021, 1:24 PM

## 2021-11-28 ENCOUNTER — Other Ambulatory Visit: Payer: Self-pay

## 2021-11-28 ENCOUNTER — Ambulatory Visit (INDEPENDENT_AMBULATORY_CARE_PROVIDER_SITE_OTHER): Payer: 59 | Admitting: *Deleted

## 2021-11-28 ENCOUNTER — Encounter (HOSPITAL_COMMUNITY): Payer: Self-pay

## 2021-11-28 VITALS — BP 119/90 | HR 78 | Ht 62.0 in | Wt 230.0 lb

## 2021-11-28 DIAGNOSIS — F319 Bipolar disorder, unspecified: Secondary | ICD-10-CM

## 2021-11-28 NOTE — Progress Notes (Signed)
In as scheduled for her monthly appt. She gets Abilify M 400 mg in her L DELTOID without issue. She is pleasant and denies any issues. She continues to work, doesn't really care for it but she continues because the pay is good and she has been there for awhile. She has lost 5 lbs. Grandmother now living with her, its going OK. To return in one month for her next injection.

## 2021-12-26 ENCOUNTER — Ambulatory Visit (HOSPITAL_COMMUNITY): Payer: 59 | Admitting: *Deleted

## 2021-12-26 ENCOUNTER — Other Ambulatory Visit: Payer: Self-pay

## 2021-12-26 ENCOUNTER — Encounter (HOSPITAL_COMMUNITY): Payer: Self-pay

## 2021-12-26 VITALS — BP 122/87 | Ht 62.0 in | Wt 236.0 lb

## 2021-12-26 DIAGNOSIS — F319 Bipolar disorder, unspecified: Secondary | ICD-10-CM

## 2021-12-26 NOTE — Progress Notes (Signed)
In as scheduled for her monthly injection of Abilify M 400 mg, given today in her R DELTOID without issue. SHe states she is doing OK with her Grandma living with her. She says work is supposed to be giving her a raise in April but she is unsure if they will follow thru. She likes the work and they pay well but it doesn't keep her from feeling frustrated with it at times. She offers no complaints. She is due to return in one month for her next injection. ?

## 2022-01-20 ENCOUNTER — Telehealth (INDEPENDENT_AMBULATORY_CARE_PROVIDER_SITE_OTHER): Payer: 59 | Admitting: Psychiatry

## 2022-01-20 DIAGNOSIS — F319 Bipolar disorder, unspecified: Secondary | ICD-10-CM | POA: Diagnosis not present

## 2022-01-20 NOTE — Progress Notes (Signed)
BH MD/PA/NP OP Progress Note ? ?01/20/2022 4:05 PM ?Amy Sexton  ?MRN:  [id number] ? ?Virtual Visit via Video Note ? ?I connected with Amy Sexton on 01/20/22 at  4:00 PM EDT by a video enabled telemedicine application and verified that I am speaking with the correct person using two identifiers. ? ?Location: ?Patient: home ?Provider: off site ?  ?I discussed the limitations of evaluation and management by telemedicine and the availability of in person appointments. The patient expressed understanding and agreed to proceed. ?  ?I discussed the assessment and treatment plan with the patient. The patient was provided an opportunity to ask questions and all were answered. The patient agreed with the plan and demonstrated an understanding of the instructions. ?  ?The patient was advised to call back or seek an in-person evaluation if the symptoms worsen or if the condition fails to improve as anticipated. ? ?I provided 5 minutes of non-face-to-face time during this encounter. ? ? ?Mcneil Sober, NP  ? ?Chief Complaint: Medication management ? ?HPI: Amy Sexton is a 36 year old female presenting to [facility] behavioral health outpatient for follow-up psychiatric evaluation.  Psychiatric history of generalized anxiety disorder and bipolar disorder.  Her symptoms are managed with Abilify Maintena 400 mg every 28 days and trazodone 50 mg at bedtime as needed for sleep.  She reports that medications are effective and she is medication compliant.  Patient denies adverse effects or the need for dosage adjustment today.  No medication changes.  She also denies need for medication refills on trazodone reporting that she uses trazodone seldom. ? ?Patient is alert and oriented x4, calm, pleasant and willing to engage.  She is dressed appropriately for the weather and appears well-groomed.  She reports okay mood and good appetite and sleep cycle.  Patient reports that she does work third shift so her sleep is during  day time hours.  Patient denies suicidal ideations or homicidal ideations, paranoia, delusional thought, auditory or visual hallucinations. ? ?Visit Diagnosis:  ?  ICD-10-CM   ?1. Bipolar I disorder (HCC)  F31.9   ?  ? ? ?Past Psychiatric History: Bipolar 1 disorder and generalized anxiety disorder ? ?Past Medical History:  ?Past Medical History:  ?Diagnosis Date  ? ADHD (attention deficit hyperactivity disorder)   ? Asthma   ? No past surgical history on file. ? ?Family Psychiatric History: None known ? ?Family History:  ?Family History  ?Problem Relation Age of Onset  ? Hypertension Mother   ? Heart disease Maternal Grandmother   ? Hyperlipidemia Maternal Grandmother   ? ? ?Social History:  ?Social History  ? ?Socioeconomic History  ? Marital status: Single  ?  Spouse name: Not on file  ? Number of children: Not on file  ? Years of education: Not on file  ? Highest education level: Not on file  ?Occupational History  ? Not on file  ?Tobacco Use  ? Smoking status: Every Day  ?  Packs/day: 0.50  ?  Years: 10.00  ?  Pack years: 5.00  ?  Types: Cigarettes  ? Smokeless tobacco: Never  ?Vaping Use  ? Vaping Use: Never used  ?Substance and Sexual Activity  ? Alcohol use: Yes  ?  Alcohol/week: 3.0 standard drinks  ?  Types: 1 Glasses of wine, 2 Cans of beer per week  ? Drug use: No  ? Sexual activity: Yes  ?  Birth control/protection: Condom  ?Other Topics Concern  ? Not on file  ?Social History Narrative  ?  Not on file  ? ?Social Determinants of Health  ? ?Financial Resource Strain: Not on file  ?Food Insecurity: Not on file  ?Transportation Needs: Not on file  ?Physical Activity: Not on file  ?Stress: Not on file  ?Social Connections: Not on file  ? ? ?Allergies: No Known Allergies ? ?Metabolic Disorder Labs: ?Lab Results  ?Component Value Date  ? HGBA1C 5.5 01/25/2016  ? ?No results found for: PROLACTIN ?Lab Results  ?Component Value Date  ? CHOL 149 01/03/2021  ? TRIG 54.0 01/03/2021  ? HDL 41.60 01/03/2021  ? CHOLHDL  4 01/03/2021  ? VLDL 10.8 01/03/2021  ? LDLCALC 97 01/03/2021  ? LDLCALC 120 (H) 09/08/2018  ? ?Lab Results  ?Component Value Date  ? TSH 0.714 03/27/2017  ? TSH 0.81 01/25/2016  ? ? ?Therapeutic Level Labs: ?No results found for: LITHIUM ?No results found for: VALPROATE ?No components found for:  CBMZ ? ?Current Medications: ?Current Outpatient Medications  ?Medication Sig Dispense Refill  ? amLODipine (NORVASC) 10 MG tablet Take 1 tablet (10 mg total) by mouth daily. 90 tablet 3  ? ARIPiprazole ER (ABILIFY MAINTENA) 400 MG PRSY prefilled syringe Inject 400 mg into the muscle every 28 (twenty-eight) days. 1 each 11  ? cyclobenzaprine (FLEXERIL) 5 MG tablet Take 1 tablet (5 mg total) by mouth 2 (two) times daily as needed for muscle spasms. (Patient not taking: Reported on 11/28/2021) 30 tablet 1  ? hydrochlorothiazide (MICROZIDE) 12.5 MG capsule Take 1 capsule (12.5 mg total) by mouth daily. 90 capsule 1  ? ibuprofen (ADVIL,MOTRIN) 200 MG tablet Take 200 mg by mouth every 6 (six) hours as needed. (Patient not taking: Reported on 11/28/2021)    ? meclizine (ANTIVERT) 25 MG tablet Take 1 tablet (25 mg total) by mouth 3 (three) times daily as needed for dizziness. (Patient not taking: Reported on 11/28/2021) 30 tablet 0  ? naproxen (NAPROSYN) 500 MG tablet Take 1 tablet (500 mg total) by mouth 2 (two) times daily. (Patient not taking: Reported on 11/28/2021) 30 tablet 0  ? traZODone (DESYREL) 50 MG tablet Take 1 tablet (50 mg total) by mouth at bedtime as needed for sleep. 30 tablet 3  ? ?Current Facility-Administered Medications  ?Medication Dose Route Frequency Provider Last Rate Last Admin  ? ARIPiprazole ER (ABILIFY MAINTENA) 400 MG prefilled syringe 400 mg  400 mg Intramuscular Q28 days Toy Cookey E, NP   400 mg at 12/26/21 1621  ? ? ? ?Musculoskeletal: ?Strength & Muscle Tone: N/A virtual visit ?Gait & Station: N/A virtual visit ?Patient leans: N/A ? ?Psychiatric Specialty Exam: ?Review of Systems   ?Psychiatric/Behavioral:  Negative for hallucinations, self-injury and suicidal ideas.   ?All other systems reviewed and are negative.  ?There were no vitals taken for this visit.There is no height or weight on file to calculate BMI.  ?General Appearance: Well-groomed  ?Eye Contact: Good  ?Speech: Clear and coherent  ?Volume: Normal  ?Mood:  Euthymic  ?Affect:  Congruent  ?Thought Process:  Goal Directed  ?Orientation:  Full (Time, Place, and Person)  ?Thought Content: Logical   ?Suicidal Thoughts:  No  ?Homicidal Thoughts:  No  ?Memory: Good  ?Judgement:  Good  ?Insight:  Good  ?Psychomotor Activity:  NA  ?Concentration: Good  ?Recall:  Good  ?Fund of Knowledge: Good  ?Language: Good  ?Akathisia:  NA  ?Handed:  Right  ?AIMS (if indicated): not done  ?Assets:  Communication Skills ?Desire for Improvement  ?ADL's:  Intact  ?Cognition: WNL  ?Sleep:  Good  ? ?Screenings: ?GAD-7   ? ?Flowsheet Row [facility]20[facility] Video [facility]20[facility]07[facility]2020 [facility]  ?Total GAD-7 Score 12 14 16 13 9   ? ?  ? ?PHQ2-9   ? ?Flowsheet Row Video [facility]20[facility] Support [facility]20[facility]20[facility]20[facility]2020 [facility]  ?PHQ-2 Total Score 3 3 4 3 3   ?PHQ-9 Total Score 12 13 13 13 11   ? ?  ? ?Flowsheet Row [facility]  Clinical Support [facility]20[facility]  ?C-SSRS RISK CATEGORY No Risk No Risk No Risk  ? ?  ? ? ? ?Assessment and Plan: Amy GibbsChiquita Sexton is a 36 year [facility]

## 2022-01-23 ENCOUNTER — Encounter (HOSPITAL_COMMUNITY): Payer: Self-pay

## 2022-01-23 ENCOUNTER — Ambulatory Visit (INDEPENDENT_AMBULATORY_CARE_PROVIDER_SITE_OTHER): Payer: 59 | Admitting: *Deleted

## 2022-01-23 VITALS — BP 126/94 | HR 80 | Ht 62.0 in | Wt 230.0 lb

## 2022-01-23 DIAGNOSIS — F319 Bipolar disorder, unspecified: Secondary | ICD-10-CM

## 2022-01-23 NOTE — Progress Notes (Signed)
PATIENT ARRIVED FOR ARIPiprazole ER (ABILIFY MAINTENA) 400 MG  ?TOLERATED WELL IN  LEFT-ARM PLEASANT AS ALWAYS  ?

## 2022-02-20 ENCOUNTER — Encounter (HOSPITAL_COMMUNITY): Payer: Self-pay

## 2022-02-20 ENCOUNTER — Ambulatory Visit (INDEPENDENT_AMBULATORY_CARE_PROVIDER_SITE_OTHER): Payer: 59 | Admitting: *Deleted

## 2022-02-20 VITALS — BP 123/85 | HR 72 | Resp 16 | Ht 62.0 in | Wt 232.0 lb

## 2022-02-20 DIAGNOSIS — F319 Bipolar disorder, unspecified: Secondary | ICD-10-CM | POA: Diagnosis not present

## 2022-02-20 NOTE — Progress Notes (Signed)
In as scheduled for her monthly injection of Abilify M 400 mg given today in her R deltoid. She reports everything being the same, no complaints and is at baseline. She continues to work nights and her grandma is living with her but she is away for the week visiting other relatives. She is to return in one month.  ?

## 2022-03-20 ENCOUNTER — Ambulatory Visit (INDEPENDENT_AMBULATORY_CARE_PROVIDER_SITE_OTHER): Payer: 59 | Admitting: *Deleted

## 2022-03-20 ENCOUNTER — Encounter (HOSPITAL_COMMUNITY): Payer: Self-pay

## 2022-03-20 VITALS — BP 122/95 | HR 79 | Ht 62.0 in | Wt 229.0 lb

## 2022-03-20 DIAGNOSIS — F319 Bipolar disorder, unspecified: Secondary | ICD-10-CM

## 2022-03-20 NOTE — Progress Notes (Signed)
PATIENT ARRIVED FOR  (ABILIFY MAINTENA) 400 MG  INJECTION. TOLERATED INJECTION WELL IN LEFT-ARM.  PLEASANT AS ALWAYS. PATIENT RECENTLY CELEBRATED BIRTHDAY ON  5/24

## 2022-04-17 ENCOUNTER — Ambulatory Visit (HOSPITAL_COMMUNITY): Payer: 59

## 2022-04-17 ENCOUNTER — Telehealth (HOSPITAL_COMMUNITY): Payer: 59 | Admitting: Psychiatry

## 2022-04-17 ENCOUNTER — Ambulatory Visit (INDEPENDENT_AMBULATORY_CARE_PROVIDER_SITE_OTHER): Payer: 59 | Admitting: *Deleted

## 2022-04-17 ENCOUNTER — Encounter (HOSPITAL_COMMUNITY): Payer: Self-pay

## 2022-04-17 VITALS — BP 115/84 | HR 70 | Ht 62.0 in | Wt 224.0 lb

## 2022-04-17 DIAGNOSIS — F319 Bipolar disorder, unspecified: Secondary | ICD-10-CM | POA: Diagnosis not present

## 2022-04-30 ENCOUNTER — Telehealth (INDEPENDENT_AMBULATORY_CARE_PROVIDER_SITE_OTHER): Payer: 59 | Admitting: Psychiatry

## 2022-04-30 ENCOUNTER — Encounter (HOSPITAL_COMMUNITY): Payer: Self-pay | Admitting: Psychiatry

## 2022-04-30 DIAGNOSIS — F319 Bipolar disorder, unspecified: Secondary | ICD-10-CM

## 2022-04-30 DIAGNOSIS — F411 Generalized anxiety disorder: Secondary | ICD-10-CM | POA: Diagnosis not present

## 2022-04-30 MED ORDER — TRAZODONE HCL 50 MG PO TABS: 50.0000 mg | ORAL_TABLET | Freq: Every evening | ORAL | 3 refills | Status: DC | PRN

## 2022-04-30 MED ORDER — ARIPIPRAZOLE ER 400 MG IM PRSY: 400.0000 mg | PREFILLED_SYRINGE | INTRAMUSCULAR | 11 refills | Status: DC

## 2022-04-30 NOTE — Progress Notes (Signed)
[place] MD/PA/NP OP Progress Note Virtual Visit via Telephone Note  I connected with Amy Sexton on 04/30/22 at  4:00 PM EDT by telephone and verified that I am speaking with the correct person using two identifiers.  Location: Patient: home Provider: Clinic   I discussed the limitations, risks, security and privacy concerns of performing an evaluation and management service by telephone and the availability of in person appointments. I also discussed with the patient that there may be a patient responsible charge related to this service. The patient expressed understanding and agreed to proceed.   I provided 30 minutes of non-face-to-face time during this encounter.      04/30/2022 3:48 PM Amy Sexton  MRN:  [id number]  Chief Complaint: "I'm alright"  HPI: 36 year old female seen today for follow-up psychiatric evaluation. She has a psychiatric history of Bipolar disorder, ADHD, and tobacco dependence. She is currently managed on Trazodone 50 mg and Abilify Maintena 400 mg monthly injection.  She noted that her medications are effective in managing her psychiatric evaluations.     Today she was unable to login virtually so if this was done over the phone.  During exam she was pleasant, cooperative, and engaged in conversation.  She informed Probation officer that she is doing all right.  She notes that she continues to work third shift and spend most of her day sleeping.  Patient reports that her anxiety and depression continue to be minimal.  Provider conducted a GAD-7 and patient scored a 7.  Provider also conducted PHQ-9 and patient scored a 12.  She endorses poor appetite noting that she only eats once a day.  She denies recent weight loss.  Today she denies SI/HI/AVH, mania, paranoia.    No medication changes made today.  Patient agreeable to continue medications prescribed.  No other concerns at this time.        Visit Diagnosis:    ICD-10-CM   1. Bipolar I disorder (HCC)  F31.9  traZODone (DESYREL) 50 MG tablet    ARIPiprazole ER (ABILIFY MAINTENA) 400 MG PRSY prefilled syringe    2. Generalized anxiety disorder  F41.1 traZODone (DESYREL) 50 MG tablet      Past Psychiatric History: Bipolar disorder, ADHD, and tobacco dependence.  Past Medical History:  Past Medical History:  Diagnosis Date   ADHD (attention deficit hyperactivity disorder)    Asthma    History reviewed. No pertinent surgical history.  Family Psychiatric History: Denies  Family History:  Family History  Problem Relation Age of Onset   Hypertension Mother    Heart disease Maternal Grandmother    Hyperlipidemia Maternal Grandmother     Social History:  Social History   Socioeconomic History   Marital status: Single    Spouse name: Not on file   Number of children: Not on file   Years of education: Not on file   Highest education level: Not on file  Occupational History   Not on file  Tobacco Use   Smoking status: Every Day    Packs/day: 0.50    Years: 10.00    Total pack years: 5.00    Types: Cigarettes   Smokeless tobacco: Never  Vaping Use   Vaping Use: Never used  Substance and Sexual Activity   Alcohol use: Yes    Alcohol/week: 3.0 standard drinks of alcohol    Types: 1 Glasses of wine, 2 Cans of beer per week   Drug use: No   Sexual activity: Yes    Birth control/protection: Condom  Other Topics Concern   Not on file  Social History Narrative   Not on file   Social Determinants of Health   Financial Resource Strain: Not on file  Food Insecurity: Not on file  Transportation Needs: Not on file  Physical Activity: Not on file  Stress: Not on file  Social Connections: Not on file    Allergies: No Known Allergies  Metabolic Disorder Labs: Lab Results  Component Value Date   HGBA1C 5.5 01/25/2016   No results found for: "PROLACTIN" Lab Results  Component Value Date   CHOL 149 01/03/2021   TRIG 54.0 01/03/2021   HDL 41.60 01/03/2021   CHOLHDL 4  01/03/2021   VLDL 10.8 01/03/2021   LDLCALC 97 01/03/2021   LDLCALC 120 (H) 09/08/2018   Lab Results  Component Value Date   TSH 0.714 03/27/2017   TSH 0.81 01/25/2016    Therapeutic Level Labs: No results found for: "LITHIUM" No results found for: "VALPROATE" No results found for: "CBMZ"  Current Medications: Current Outpatient Medications  Medication Sig Dispense Refill   amLODipine (NORVASC) 10 MG tablet Take 1 tablet (10 mg total) by mouth daily. 90 tablet 3   ARIPiprazole ER (ABILIFY MAINTENA) 400 MG PRSY prefilled syringe Inject 400 mg into the muscle every 28 (twenty-eight) days. 1 each 11   cyclobenzaprine (FLEXERIL) 5 MG tablet Take 1 tablet (5 mg total) by mouth 2 (two) times daily as needed for muscle spasms. 30 tablet 1   hydrochlorothiazide (MICROZIDE) 12.5 MG capsule Take 1 capsule (12.5 mg total) by mouth daily. 90 capsule 1   ibuprofen (ADVIL,MOTRIN) 200 MG tablet Take 200 mg by mouth every 6 (six) hours as needed.     meclizine (ANTIVERT) 25 MG tablet Take 1 tablet (25 mg total) by mouth 3 (three) times daily as needed for dizziness. 30 tablet 0   naproxen (NAPROSYN) 500 MG tablet Take 1 tablet (500 mg total) by mouth 2 (two) times daily. 30 tablet 0   traZODone (DESYREL) 50 MG tablet Take 1 tablet (50 mg total) by mouth at bedtime as needed for sleep. 30 tablet 3   Current Facility-Administered Medications  Medication Dose Route Frequency Provider Last Rate Last Admin   ARIPiprazole ER (ABILIFY MAINTENA) 400 MG prefilled syringe 400 mg  400 mg Intramuscular Q28 days Toy Cookey E, NP   400 mg at 04/17/22 1555     Musculoskeletal: Strength & Muscle Tone:  Unable to assess due to telephone visit Gait & Station:  Unable to assess due to telephone visit Patient leans: N/A  Psychiatric Specialty Exam: Review of Systems  There were no vitals taken for this visit.There is no height or weight on file to calculate BMI.  General Appearance:  Unable to assess  due to telephone visit  Eye Contact:   Unable to assess due to telephone visit  Speech:  Clear and Coherent and Normal Rate  Volume:  Normal  Mood:  Euthymic,   Affect:  Appropriate and Congruent  Thought Process:  Coherent, Goal Directed and Linear  Orientation:  Full (Time, Place, and Person)  Thought Content: WDL and Logical   Suicidal Thoughts:  No  Homicidal Thoughts:  No  Memory:  Immediate;   Good Recent;   Good Remote;   Good  Judgement:  Good  Insight:  Good  Psychomotor Activity:   Unable to assess due to telephone visit  Concentration:  Concentration: Good and Attention Span: Good  Recall:  Good  Fund of Knowledge: Good  Language: Good  Akathisia:   Unable to assess due to telephone visit  Handed:  Right  AIMS (if indicated):Not done  Assets:  Communication Skills Desire for Improvement Financial Resources/Insurance Housing Social Support  ADL's:  Intact  Cognition: WNL  Sleep:  Good   Screenings: GAD-7    Flowsheet Row Video Visit from 04/30/2022 in [facility] Video Visit from 11/06/2021 in [facility] Clinical Support from 08/05/2021 in [facility] Clinical Support from 05/06/2021 in [facility] Video Visit from 02/05/2021 in [facility]  Total GAD-7 Score 7 12 14 16 13       PHQ2-9    Flowsheet Row Video Visit from 04/30/2022 in [facility] Video Visit from 11/06/2021 in [facility] Clinical Support from 08/05/2021 in [facility] Clinical Support from 05/06/2021 in [facility] Video Visit from 02/05/2021 in [facility]  PHQ-2 Total Score 3 3 3 4 3   PHQ-9 Total Score 12 12 13 13 13       Flowsheet Row Video Visit from 11/06/2021 in [facility] Clinical  Support from 08/05/2021 in [facility] Clinical Support from 05/06/2021 in [facility]  C-SSRS RISK CATEGORY No Risk No Risk No Risk        Assessment and Plan: Patient reports that she is doing well on her current medication regimen.  No medication changes made today.  Patient agreeable to continue medications as prescribed.  1. Bipolar I disorder (HCC)  Continue- traZODone (DESYREL) 50 MG tablet; Take 1 tablet (50 mg total) by mouth at bedtime as needed for sleep.  Dispense: 30 tablet; Refill: 3 Continue- ARIPiprazole ER (ABILIFY MAINTENA) 400 MG PRSY prefilled syringe; Inject 400 mg into the muscle every 28 (twenty-eight) days.  Dispense: 1 each; Refill: 11  2. Generalized anxiety disorder  Continue- traZODone (DESYREL) 50 MG tablet; Take 1 tablet (50 mg total) by mouth at bedtime as needed for sleep.  Dispense: 30 tablet; Refill: 3      Follow up in 3 months Follow up with therapy.     [facility], NP 04/30/2022, 3:48 PM

## 2022-05-15 ENCOUNTER — Ambulatory Visit (INDEPENDENT_AMBULATORY_CARE_PROVIDER_SITE_OTHER): Payer: 59 | Admitting: *Deleted

## 2022-05-15 ENCOUNTER — Encounter (HOSPITAL_COMMUNITY): Payer: Self-pay

## 2022-05-15 VITALS — BP 108/81 | HR 80 | Ht 62.0 in | Wt 230.4 lb

## 2022-05-15 DIAGNOSIS — F319 Bipolar disorder, unspecified: Secondary | ICD-10-CM | POA: Diagnosis not present

## 2022-05-15 MED ORDER — ARIPIPRAZOLE ER 400 MG IM PRSY
400.0000 mg | PREFILLED_SYRINGE | Freq: Once | INTRAMUSCULAR | Status: AC
  Administered 2022-06-12: 400 mg via INTRAMUSCULAR

## 2022-05-15 NOTE — Progress Notes (Signed)
Patient presents to the office for Abilify Maintena 400 Mg Injection. She is doing well overall with no complaints. Will return in 28 days.

## 2022-06-12 ENCOUNTER — Ambulatory Visit (INDEPENDENT_AMBULATORY_CARE_PROVIDER_SITE_OTHER): Payer: 59 | Admitting: *Deleted

## 2022-06-12 ENCOUNTER — Encounter (HOSPITAL_COMMUNITY): Payer: Self-pay

## 2022-06-12 VITALS — BP 108/69 | HR 87 | Ht 62.0 in | Wt 228.0 lb

## 2022-06-12 DIAGNOSIS — F319 Bipolar disorder, unspecified: Secondary | ICD-10-CM

## 2022-06-12 NOTE — Progress Notes (Signed)
In as scheduled for her monthly injection of Abilify M 400 mg given today in her R Deltoid without difficulty. She continues to work, grandma lives with her and there are no changes since her last visit. States she is doing well. She is to return in one month for her next injection. No complaints voiced  

## 2022-07-10 ENCOUNTER — Encounter (HOSPITAL_COMMUNITY): Payer: Self-pay

## 2022-07-10 ENCOUNTER — Ambulatory Visit (HOSPITAL_COMMUNITY): Payer: 59

## 2022-07-10 VITALS — BP 117/86 | HR 70 | Ht 62.0 in | Wt 227.0 lb

## 2022-07-10 DIAGNOSIS — F319 Bipolar disorder, unspecified: Secondary | ICD-10-CM

## 2022-07-10 DIAGNOSIS — F411 Generalized anxiety disorder: Secondary | ICD-10-CM

## 2022-07-10 NOTE — Progress Notes (Signed)
Patient presents to the office for Abilify Maintena 400 Mg Injection. She is doing well overall with no complaints. Will return in 28 days.

## 2022-08-04 ENCOUNTER — Encounter (HOSPITAL_COMMUNITY): Payer: Self-pay | Admitting: Psychiatry

## 2022-08-04 ENCOUNTER — Telehealth (INDEPENDENT_AMBULATORY_CARE_PROVIDER_SITE_OTHER): Payer: 59 | Admitting: Psychiatry

## 2022-08-04 DIAGNOSIS — F411 Generalized anxiety disorder: Secondary | ICD-10-CM | POA: Diagnosis not present

## 2022-08-04 DIAGNOSIS — F319 Bipolar disorder, unspecified: Secondary | ICD-10-CM | POA: Diagnosis not present

## 2022-08-04 MED ORDER — ARIPIPRAZOLE ER 400 MG IM PRSY: 400.0000 mg | PREFILLED_SYRINGE | INTRAMUSCULAR | 11 refills | Status: DC

## 2022-08-04 MED ORDER — TRAZODONE HCL 50 MG PO TABS: 50.0000 mg | ORAL_TABLET | Freq: Every evening | ORAL | 3 refills | Status: DC | PRN

## 2022-08-04 NOTE — Progress Notes (Signed)
[facility] MD/PA/NP OP Progress Note Virtual Visit via Video Note  I connected with Amy Sexton on 08/04/22 at  3:00 PM EDT by a video enabled telemedicine application and verified that I am speaking with the correct person using two identifiers.  Location: Patient: Home Provider: Clinic   I discussed the limitations of evaluation and management by telemedicine and the availability of in person appointments. The patient expressed understanding and agreed to proceed.  I provided 30 minutes of non-face-to-face time during this encounter.        08/04/2022 3:34 PM Amy Sexton  MRN:  [id number]  Chief Complaint: "I'm doing okay"  HPI: 36 year old female seen today for follow-up psychiatric evaluation. She has a psychiatric history of Bipolar disorder, ADHD, and tobacco dependence. She is currently managed on Trazodone 50 mg and Abilify Maintena 400 mg monthly injection.  She noted that her medications are effective in managing her psychiatric evaluations.     Today she was well-groomed, pleasant, cooperative, engaged in conversation, maintained eye contact.  She informed Clinical research associate that she has been doing okay since her last visit.  She notes that she is staying busy working.  Patient informed writer that since her last visit her sleep is somewhat improved.  She notes that she sleeps approximately 5 hours nightly.  Patient notes that her mood is stable and reports that she has minimal anxiety and depression.  Provider conducted a GAD-7 and patient scored a 9, at her last visit she scored a 7.  Provider also conducted PHQ-9 and patient scored a 12, at her last visit she scored a 12.  She endorses adequate sleep and appetite.  Today she denies SI/HI/AVH, mania, paranoia.    Patient informed Clinical research associate that she only takes trazodone periodically.  Provider informed patient that she should take it as needed to help manage sleep.  She endorsed understanding and agreed.  No medication changes made today.   Patient agreeable to continue medications prescribed.  No other concerns at this time.        Visit Diagnosis:    ICD-10-CM   1. Bipolar I disorder (HCC)  F31.9 traZODone (DESYREL) 50 MG tablet    ARIPiprazole ER (ABILIFY MAINTENA) 400 MG PRSY prefilled syringe    2. Generalized anxiety disorder  F41.1 traZODone (DESYREL) 50 MG tablet      Past Psychiatric History: Bipolar disorder, ADHD, and tobacco dependence.  Past Medical History:  Past Medical History:  Diagnosis Date   ADHD (attention deficit hyperactivity disorder)    Asthma    No past surgical history on file.  Family Psychiatric History: Denies  Family History:  Family History  Problem Relation Age of Onset   Hypertension Mother    Heart disease Maternal Grandmother    Hyperlipidemia Maternal Grandmother     Social History:  Social History   Socioeconomic History   Marital status: Single    Spouse name: Not on file   Number of children: Not on file   Years of education: Not on file   Highest education level: Not on file  Occupational History   Not on file  Tobacco Use   Smoking status: Every Day    Packs/day: 0.50    Years: 10.00    Total pack years: 5.00    Types: Cigarettes   Smokeless tobacco: Never  Vaping Use   Vaping Use: Never used  Substance and Sexual Activity   Alcohol use: Yes    Alcohol/week: 3.0 standard drinks of alcohol    Types:  1 Glasses of wine, 2 Cans of beer per week   Drug use: No   Sexual activity: Yes    Birth control/protection: Condom  Other Topics Concern   Not on file  Social History Narrative   Not on file   Social Determinants of Health   Financial Resource Strain: Not on file  Food Insecurity: Not on file  Transportation Needs: Not on file  Physical Activity: Not on file  Stress: Not on file  Social Connections: Not on file    Allergies: No Known Allergies  Metabolic Disorder Labs: Lab Results  Component Value Date   HGBA1C 5.5 01/25/2016   No  results found for: "PROLACTIN" Lab Results  Component Value Date   CHOL 149 01/03/2021   TRIG 54.0 01/03/2021   HDL 41.60 01/03/2021   CHOLHDL 4 01/03/2021   VLDL 10.8 01/03/2021   LDLCALC 97 01/03/2021   LDLCALC 120 (H) 09/08/2018   Lab Results  Component Value Date   TSH 0.714 03/27/2017   TSH 0.81 01/25/2016    Therapeutic Level Labs: No results found for: "LITHIUM" No results found for: "VALPROATE" No results found for: "CBMZ"  Current Medications: Current Outpatient Medications  Medication Sig Dispense Refill   amLODipine (NORVASC) 10 MG tablet Take 1 tablet (10 mg total) by mouth daily. 90 tablet 3   ARIPiprazole ER (ABILIFY MAINTENA) 400 MG PRSY prefilled syringe Inject 400 mg into the muscle every 28 (twenty-eight) days. 1 each 11   cyclobenzaprine (FLEXERIL) 5 MG tablet Take 1 tablet (5 mg total) by mouth 2 (two) times daily as needed for muscle spasms. 30 tablet 1   hydrochlorothiazide (MICROZIDE) 12.5 MG capsule Take 1 capsule (12.5 mg total) by mouth daily. 90 capsule 1   ibuprofen (ADVIL,MOTRIN) 200 MG tablet Take 200 mg by mouth every 6 (six) hours as needed.     meclizine (ANTIVERT) 25 MG tablet Take 1 tablet (25 mg total) by mouth 3 (three) times daily as needed for dizziness. 30 tablet 0   naproxen (NAPROSYN) 500 MG tablet Take 1 tablet (500 mg total) by mouth 2 (two) times daily. 30 tablet 0   traZODone (DESYREL) 50 MG tablet Take 1 tablet (50 mg total) by mouth at bedtime as needed for sleep. 30 tablet 3   Current Facility-Administered Medications  Medication Dose Route Frequency Provider Last Rate Last Admin   ARIPiprazole ER (ABILIFY MAINTENA) 400 MG prefilled syringe 400 mg  400 mg Intramuscular Q28 days Eulis Canner E, NP   400 mg at 06/12/22 1559     Musculoskeletal: Strength & Muscle Tone: within normal limits and telehealth visit Gait & Station: normal, telehealth visit Patient leans: N/A  Psychiatric Specialty Exam: Review of Systems   There were no vitals taken for this visit.There is no height or weight on file to calculate BMI.  General Appearance: Well Groomed  Eye Contact:  Good  Speech:  Clear and Coherent and Normal Rate  Volume:  Normal  Mood:  Euthymic,   Affect:  Appropriate and Congruent  Thought Process:  Coherent, Goal Directed and Linear  Orientation:  Full (Time, Place, and Person)  Thought Content: WDL and Logical   Suicidal Thoughts:  No  Homicidal Thoughts:  No  Memory:  Immediate;   Good Recent;   Good Remote;   Good  Judgement:  Good  Insight:  Good  Psychomotor Activity:  Normal  Concentration:  Concentration: Good and Attention Span: Good  Recall:  Good  Fund of Knowledge: Good  Language: Good  Akathisia:  No  Handed:  Right  AIMS (if indicated):Not done  Assets:  Communication Skills Desire for Improvement Financial Resources/Insurance Housing Social Support  ADL's:  Intact  Cognition: WNL  Sleep:  Good   Screenings: GAD-7    Flowsheet Row Video Visit from 08/04/2022 in [facility] Video Visit from 04/30/2022 in [facility] Video Visit from 11/06/2021 in [facility] Clinical Support from 08/05/2021 in [facility] Clinical Support from 05/06/2021 in [facility]  Total GAD-7 Score 9 7 12 14 16       PHQ2-9    Flowsheet Row Video Visit from 08/04/2022 in [facility] Video Visit from 04/30/2022 in [facility] Video Visit from 11/06/2021 in [facility] Clinical Support from 08/05/2021 in [facility] Clinical Support from 05/06/2021 in [facility]  PHQ-2 Total Score 2 3 3 3 4   PHQ-9 Total Score 12 12 12 13 13       Flowsheet Row Video Visit from 11/06/2021 in [facility] Clinical  Support from 08/05/2021 in [facility] Clinical Support from 05/06/2021 in [facility]  C-SSRS RISK CATEGORY No Risk No Risk No Risk        Assessment and Plan: Patient reports that she is doing well on her current medication regimen.  She informed [facility] that she takes trazodone periodically.  Provider encouraged patient to take trazodone as needed for sleep.  She endorsed understanding and agreed.  No medication changes made today.  Patient agreeable to continue medications as prescribed.  1. Bipolar I disorder (HCC)  Continue- traZODone (DESYREL) 50 MG tablet; Take 1 tablet (50 mg total) by mouth at bedtime as needed for sleep.  Dispense: 30 tablet; Refill: 3 Continue- ARIPiprazole ER (ABILIFY MAINTENA) 400 MG PRSY prefilled syringe; Inject 400 mg into the muscle every 28 (twenty-eight) days.  Dispense: 1 each; Refill: 11  2. Generalized anxiety disorder  Continue- traZODone (DESYREL) 50 MG tablet; Take 1 tablet (50 mg total) by mouth at bedtime as needed for sleep.  Dispense: 30 tablet; Refill: 3      Follow up in 3 months Follow up with therapy.     07/07/2021, NP 08/04/2022, 3:34 PM

## 2022-08-07 ENCOUNTER — Ambulatory Visit (INDEPENDENT_AMBULATORY_CARE_PROVIDER_SITE_OTHER): Payer: 59

## 2022-08-07 ENCOUNTER — Encounter (HOSPITAL_COMMUNITY): Payer: Self-pay

## 2022-08-07 VITALS — BP 115/92 | HR 71 | Ht 62.0 in | Wt 230.0 lb

## 2022-08-07 DIAGNOSIS — F411 Generalized anxiety disorder: Secondary | ICD-10-CM

## 2022-08-07 DIAGNOSIS — F319 Bipolar disorder, unspecified: Secondary | ICD-10-CM | POA: Diagnosis not present

## 2022-08-07 DIAGNOSIS — F315 Bipolar disorder, current episode depressed, severe, with psychotic features: Secondary | ICD-10-CM

## 2022-08-07 NOTE — Progress Notes (Signed)
In as scheduled for her monthly injection of Abilify M 400 mg given today in her R Deltoid without difficulty. She continues to work, grandma lives with her and there are no changes since her last visit. States she is doing well. She is to return in one month for her next injection. No complaints voiced  

## 2022-09-04 ENCOUNTER — Ambulatory Visit (HOSPITAL_COMMUNITY): Payer: 59 | Admitting: *Deleted

## 2022-09-04 ENCOUNTER — Encounter (HOSPITAL_COMMUNITY): Payer: Self-pay

## 2022-09-04 VITALS — BP 109/77 | HR 82 | Resp 12 | Ht 62.0 in | Wt 231.0 lb

## 2022-09-04 DIAGNOSIS — F315 Bipolar disorder, current episode depressed, severe, with psychotic features: Secondary | ICD-10-CM

## 2022-09-04 DIAGNOSIS — F411 Generalized anxiety disorder: Secondary | ICD-10-CM

## 2022-09-04 DIAGNOSIS — F319 Bipolar disorder, unspecified: Secondary | ICD-10-CM

## 2022-09-04 NOTE — Progress Notes (Signed)
In as scheduled for her monthly injection of abilify M 400 mg, given today in her R DELTOID. She continues to work, her grandma is still living with her. She is at her baseline and offers no complaints. She is to return in 28 days for her next inj.

## 2022-10-02 ENCOUNTER — Ambulatory Visit (HOSPITAL_COMMUNITY): Payer: 59 | Admitting: *Deleted

## 2022-10-02 ENCOUNTER — Encounter (HOSPITAL_COMMUNITY): Payer: Self-pay

## 2022-10-02 VITALS — BP 142/102 | HR 72 | Ht 62.0 in | Wt 237.0 lb

## 2022-10-02 DIAGNOSIS — F319 Bipolar disorder, unspecified: Secondary | ICD-10-CM

## 2022-10-02 NOTE — Patient Instructions (Signed)
Patient arrived for her monthly Q 28 day Injection ARIPiprazole ER (ABILIFY MAINTENA) 400 MG. Patient pleasant as Always. Had a good Thanksgiving. Tolerated injection well in Left-Arm. NO HI/SI   NOR  AH/VH. PATIENT BP WAS ELEVATED X 2. PROVIDER CAME IN SPOKE WITH PATIENT  &  ENCOURAGED TO CALL HER PCP TO INFORM OF ELEVATED READINGS(given to patient). PATIENT ACKNOWLEDGE & SAID SHE WILL FOLLOW UP WITH PCP. 

## 2022-10-02 NOTE — Progress Notes (Cosign Needed)
Patient arrived for her monthly Q 28 day Injection ARIPiprazole ER (ABILIFY MAINTENA) 400 MG. Patient pleasant as Always. Had a good Thanksgiving. Tolerated injection well in Left-Arm. NO HI/SI   NOR  AH/VH. PATIENT BP WAS ELEVATED X 2. PROVIDER CAME IN SPOKE WITH PATIENT  &  ENCOURAGED TO CALL HER PCP TO INFORM OF ELEVATED READINGS(given to patient). PATIENT ACKNOWLEDGE & SAID SHE WILL FOLLOW UP WITH PCP.

## 2022-10-30 ENCOUNTER — Ambulatory Visit (HOSPITAL_COMMUNITY): Payer: 59 | Admitting: Psychiatry

## 2022-10-30 ENCOUNTER — Encounter (HOSPITAL_COMMUNITY): Payer: Self-pay

## 2022-10-30 VITALS — BP 127/89 | HR 84 | Ht 62.0 in | Wt 234.0 lb

## 2022-10-30 DIAGNOSIS — F319 Bipolar disorder, unspecified: Secondary | ICD-10-CM

## 2022-11-04 ENCOUNTER — Telehealth (INDEPENDENT_AMBULATORY_CARE_PROVIDER_SITE_OTHER): Payer: 59 | Admitting: Student in an Organized Health Care Education/Training Program

## 2022-11-04 ENCOUNTER — Encounter (HOSPITAL_COMMUNITY): Payer: Self-pay | Admitting: Student in an Organized Health Care Education/Training Program

## 2022-11-04 DIAGNOSIS — F319 Bipolar disorder, unspecified: Secondary | ICD-10-CM

## 2022-11-04 DIAGNOSIS — F411 Generalized anxiety disorder: Secondary | ICD-10-CM

## 2022-11-04 MED ORDER — TRAZODONE HCL 50 MG PO TABS: 50.0000 mg | ORAL_TABLET | Freq: Every evening | ORAL | 3 refills | Status: DC | PRN

## 2022-11-04 NOTE — Progress Notes (Signed)
BH MD/PA/NP OP Progress Note  11/04/2022 5:03 PM Amy Sexton  MRN:  [id number]  Chief Complaint:  Chief Complaint  Patient presents with   Follow-up  Virtual Visit via Video Note  I connected with Amy Sexton on 11/04/22 at 10:30 AM EST by a video enabled telemedicine application and verified that I am speaking with the correct person using two identifiers.  Location: Patient: Home Provider: Office   I discussed the limitations of evaluation and management by telemedicine and the availability of in person appointments. The patient expressed understanding and agreed to proceed.  History of Present Illness:  Amy Sexton is a 37 year old female seen today for follow-up psychiatric evaluation. She has a psychiatric history of Bipolar disorder, ADHD, and tobacco dependence.  Patient reports that she has been compliant with her medications Abilify Maintena 400mg  and trazodone 50mg  QHS.  Patient reports that her mood is fine and her sleep is ok, she works at night and sleeps during the day but this can be difficult for her sleep schedule. Patient reports that her grandmother thinks she sleep well. Patient reports that work is overall going well and her focus is stable and there are no concerns from her co-workers. Patient reports that her mood has been stable. Patient reports that she is eating 1-2 meals per day. Patient reports that her weight is stable. Patient reports that she has no anhedonia. Patient denies SI, HI, and AVH.   Patient denies feeling nervous, on edge, or restless. Patient denies constant irritability. Patient reports that she has not had any episodes of physical aggression like in the past before being on medicine.    Tobacco use: 0.5ppd, thinking about quitting but not quit ready  I discussed the assessment and treatment plan with the patient. The patient was provided an opportunity to ask questions and all were answered. The patient agreed with the plan  and demonstrated an understanding of the instructions.   The patient was advised to call back or seek an in-person evaluation if the symptoms worsen or if the condition fails to improve as anticipated.  I provided 20 minutes of non-face-to-face time during this encounter.   Freida Busman, MD  Visit Diagnosis:    ICD-10-CM   1. Generalized anxiety disorder  F41.1 traZODone (DESYREL) 50 MG tablet    2. Bipolar I disorder (HCC)  F31.9 traZODone (DESYREL) 50 MG tablet      Past Psychiatric History: Bipolar disorder, ADHD, and tobacco dependence.   Past Medical History:  Past Medical History:  Diagnosis Date   ADHD (attention deficit hyperactivity disorder)    Asthma    History reviewed. No pertinent surgical history.  Family Psychiatric History: denies  Family History:  Family History  Problem Relation Age of Onset   Hypertension Mother    Heart disease Maternal Grandmother    Hyperlipidemia Maternal Grandmother     Social History:  Social History   Socioeconomic History   Marital status: Single    Spouse name: Not on file   Number of children: Not on file   Years of education: Not on file   Highest education level: Not on file  Occupational History   Not on file  Tobacco Use   Smoking status: Every Day    Packs/day: 0.50    Years: 10.00    Total pack years: 5.00    Types: Cigarettes   Smokeless tobacco: Never  Vaping Use   Vaping Use: Never used  Substance and Sexual Activity  Alcohol use: Yes    Alcohol/week: 3.0 standard drinks of alcohol    Types: 1 Glasses of wine, 2 Cans of beer per week   Drug use: No   Sexual activity: Yes    Birth control/protection: Condom  Other Topics Concern   Not on file  Social History Narrative   Not on file   Social Determinants of Health   Financial Resource Strain: Not on file  Food Insecurity: Not on file  Transportation Needs: Not on file  Physical Activity: Not on file  Stress: Not on file  Social  Connections: Not on file    Allergies: No Known Allergies  Metabolic Disorder Labs: Lab Results  Component Value Date   HGBA1C 5.5 01/25/2016   No results found for: "PROLACTIN" Lab Results  Component Value Date   CHOL 149 01/03/2021   TRIG 54.0 01/03/2021   HDL 41.60 01/03/2021   CHOLHDL 4 01/03/2021   VLDL 10.8 01/03/2021   LDLCALC 97 01/03/2021   LDLCALC 120 (H) 09/08/2018   Lab Results  Component Value Date   TSH 0.714 03/27/2017   TSH 0.81 01/25/2016    Therapeutic Level Labs: No results found for: "LITHIUM" No results found for: "VALPROATE" No results found for: "CBMZ"  Current Medications: Current Outpatient Medications  Medication Sig Dispense Refill   amLODipine (NORVASC) 10 MG tablet Take 1 tablet (10 mg total) by mouth daily. 90 tablet 3   ARIPiprazole ER (ABILIFY MAINTENA) 400 MG PRSY prefilled syringe Inject 400 mg into the muscle every 28 (twenty-eight) days. 1 each 11   cyclobenzaprine (FLEXERIL) 5 MG tablet Take 1 tablet (5 mg total) by mouth 2 (two) times daily as needed for muscle spasms. 30 tablet 1   hydrochlorothiazide (MICROZIDE) 12.5 MG capsule Take 1 capsule (12.5 mg total) by mouth daily. 90 capsule 1   ibuprofen (ADVIL,MOTRIN) 200 MG tablet Take 200 mg by mouth every 6 (six) hours as needed.     meclizine (ANTIVERT) 25 MG tablet Take 1 tablet (25 mg total) by mouth 3 (three) times daily as needed for dizziness. 30 tablet 0   naproxen (NAPROSYN) 500 MG tablet Take 1 tablet (500 mg total) by mouth 2 (two) times daily. 30 tablet 0   traZODone (DESYREL) 50 MG tablet Take 1 tablet (50 mg total) by mouth at bedtime as needed for sleep. 30 tablet 3   Current Facility-Administered Medications  Medication Dose Route Frequency Provider Last Rate Last Admin   ARIPiprazole ER (ABILIFY MAINTENA) 400 MG prefilled syringe 400 mg  400 mg Intramuscular Q28 days Toy Cookey E, NP   400 mg at 10/02/22 1650     Musculoskeletal: defer Psychiatric  Specialty Exam: Review of Systems  Psychiatric/Behavioral:  Negative for dysphoric mood, hallucinations and suicidal ideas.     There were no vitals taken for this visit.There is no height or weight on file to calculate BMI.  General Appearance: Casual  Eye Contact:  Minimal  Speech:  Clear and Coherent  Volume:  Normal  Mood:  Euthymic  Affect:  Blunt  Thought Process:  Linear/ concrete  Orientation:  Full (Time, Place, and Person)  Thought Content: Logical   Suicidal Thoughts:  No  Homicidal Thoughts:  No  Memory:  Immediate;   Good Recent;   Good  Judgement:  Fair  Insight:  Shallow  Psychomotor Activity:  NA  Concentration:  Concentration: Fair  Recall:  NA  Fund of Knowledge:  limited  Language: Fair  Akathisia:  No  Handed:  AIMS (if indicated): not done  Assets:  Communication Skills Desire for Improvement Housing Resilience  ADL's:  Intact  Cognition: ? Likely at baseline  Sleep:  Good   Screenings: GAD-7    Flowsheet Row Video Visit from 08/04/2022 in [facility] Video Visit from 04/30/2022 in [facility] Video Visit from 11/06/2021 in [place] from 08/05/2021 in [place] from 05/06/2021 in [facility]  Total GAD-7 Score 9 7 12 14 16       PHQ2-9    Flowsheet Row Video Visit from 08/04/2022 in [facility] Video Visit from 04/30/2022 in [facility] Video Visit from 11/06/2021 in [place] from 08/05/2021 in [place] from 05/06/2021 in [place]  PHQ-2 Total Score 2 3 3 3 4   PHQ-9 Total Score 12 12 12 13 13       Flowsheet Row Video Visit from 11/06/2021 in [place] from 08/05/2021 in [place] from 05/06/2021 in [place] No Risk No Risk No Risk        Assessment and Plan:   Amy Sexton is a 37 year old female seen today for follow-up psychiatric evaluation. She has a psychiatric history of Bipolar disorder, ADHD, and tobacco dependence.  Based on assessment today, patient appears stable.  Patient does not endorse having any depressive or manic symptoms.  Patient is not currently in the stage of wanting to act on tobacco cessation thoughts but is still contemplating.  Would not make any medication adjustments at this time.  Bipolar 1 disorder - Continue Abilify Maintena 400 mg every 28 days - Continue trazodone 50 mg nightly as needed  F/U in 3 mon  Collaboration of Care: Collaboration of Care:   Patient/Guardian was advised Release of Information must be obtained prior to any record release in order to collaborate their care with an outside provider. Patient/Guardian was advised if they have not already done so to contact the registration department to sign all necessary forms in order for [place] to release information regarding their care.   Consent: Patient/Guardian gives verbal consent for treatment and assignment of benefits for services provided during this visit. Patient/Guardian expressed understanding and agreed to proceed.   PGY-3 Freida Busman, MD 11/04/2022, 5:03 PM

## 2022-11-05 ENCOUNTER — Telehealth (HOSPITAL_COMMUNITY): Payer: 59 | Admitting: Student in an Organized Health Care Education/Training Program

## 2022-11-27 ENCOUNTER — Encounter (HOSPITAL_COMMUNITY): Payer: Self-pay

## 2022-11-27 ENCOUNTER — Ambulatory Visit (HOSPITAL_COMMUNITY): Payer: 59

## 2022-11-27 VITALS — BP 131/92 | HR 90 | Temp 98.5°F | Resp 16 | Ht 62.0 in | Wt 240.2 lb

## 2022-11-27 DIAGNOSIS — F319 Bipolar disorder, unspecified: Secondary | ICD-10-CM

## 2022-11-27 DIAGNOSIS — F411 Generalized anxiety disorder: Secondary | ICD-10-CM

## 2022-11-27 DIAGNOSIS — F315 Bipolar disorder, current episode depressed, severe, with psychotic features: Secondary | ICD-10-CM

## 2022-11-27 NOTE — Progress Notes (Signed)
In as scheduled for her monthly injection of Abilify M 400 mg given today in her R Deltoid without difficulty. She continues to work, grandma lives with her and there are no changes since her last visit. States she is doing well. She is to return in one month for her next injection. No complaints voiced

## 2022-12-25 ENCOUNTER — Encounter (HOSPITAL_COMMUNITY): Payer: Self-pay

## 2022-12-25 ENCOUNTER — Other Ambulatory Visit (HOSPITAL_COMMUNITY): Payer: Self-pay | Admitting: Psychiatry

## 2022-12-25 ENCOUNTER — Ambulatory Visit (INDEPENDENT_AMBULATORY_CARE_PROVIDER_SITE_OTHER): Payer: 59 | Admitting: *Deleted

## 2022-12-25 VITALS — BP 124/100 | HR 89 | Ht 62.0 in | Wt 239.4 lb

## 2022-12-25 DIAGNOSIS — F319 Bipolar disorder, unspecified: Secondary | ICD-10-CM | POA: Diagnosis not present

## 2022-12-25 DIAGNOSIS — Z0000 Encounter for general adult medical examination without abnormal findings: Secondary | ICD-10-CM

## 2022-12-25 MED ORDER — ARIPIPRAZOLE ER 400 MG IM PRSY: 400.0000 mg | PREFILLED_SYRINGE | INTRAMUSCULAR | 11 refills | Status: DC

## 2022-12-25 NOTE — Progress Notes (Signed)
In as scheduled for her monthly appt for her Abilify M 400 mg given today in her L DELTOID. She is doing well, no complaints. She continues to work, grandma continues to live with her. She is to return in 28 days for her next shot.

## 2022-12-25 NOTE — Telephone Encounter (Signed)
Patient reports that she currently does not have a primary care doctor and requested a referral. Provider sent referral over to [place]. Patient blood pressure elevated today at 124/100 and generally is slightly elevated. Provider reccommended discussing blood pressure with PCP. She endorsed understanding and agreed.

## 2023-01-22 ENCOUNTER — Ambulatory Visit (INDEPENDENT_AMBULATORY_CARE_PROVIDER_SITE_OTHER): Payer: 59 | Admitting: Registered Nurse

## 2023-01-22 ENCOUNTER — Ambulatory Visit (INDEPENDENT_AMBULATORY_CARE_PROVIDER_SITE_OTHER): Payer: 59 | Admitting: *Deleted

## 2023-01-22 ENCOUNTER — Encounter (HOSPITAL_COMMUNITY): Payer: Self-pay

## 2023-01-22 ENCOUNTER — Encounter (HOSPITAL_COMMUNITY): Payer: Self-pay | Admitting: Registered Nurse

## 2023-01-22 VITALS — BP 123/86 | HR 87 | Ht 62.0 in | Wt 238.0 lb

## 2023-01-22 DIAGNOSIS — F319 Bipolar disorder, unspecified: Secondary | ICD-10-CM

## 2023-01-22 DIAGNOSIS — F411 Generalized anxiety disorder: Secondary | ICD-10-CM

## 2023-01-22 MED ORDER — ARIPIPRAZOLE ER 400 MG IM PRSY: 400.0000 mg | PREFILLED_SYRINGE | INTRAMUSCULAR | 11 refills | Status: DC

## 2023-01-22 MED ORDER — TRAZODONE HCL 50 MG PO TABS: 50.0000 mg | ORAL_TABLET | Freq: Every evening | ORAL | 3 refills | Status: DC | PRN

## 2023-01-22 NOTE — Progress Notes (Signed)
[place] MD/PA/NP OP Progress Note  01/22/2023 4:58 PM Amy Sexton  MRN:  [id number]  Chief Complaint:  Chief Complaint  Patient presents with   Follow-up    Psychiatric evaluation and administration of long acting injectable    HPI: Amy Sexton is a 37 year old female seen face to face in office today for follow-up psychiatric evaluation and administration of long acting injectable.  Her psychiatric history includes Bipolar disorder, ADHD, and tobacco dependence.  He mental health is managed with Abilify Maintena 400 mg and trazodone 50 mg QHS.  She reports that medications are effectively managing mental health with no adverse reactions.  She denies suicidal/self-harm/homicidal ideation, psychosis, paranoia, mood swings, and abnormal movements.   Visit Diagnosis:    ICD-10-CM   1. Generalized anxiety disorder  F41.1 traZODone (DESYREL) 50 MG tablet    2. Bipolar I disorder (HCC)  F31.9 traZODone (DESYREL) 50 MG tablet    ARIPiprazole ER (ABILIFY MAINTENA) 400 MG PRSY prefilled syringe      Past Psychiatric History: Bipolar disorder, ADHD, and tobacco dependence.   Past Medical History:  Past Medical History:  Diagnosis Date   ADHD (attention deficit hyperactivity disorder)    Asthma    History reviewed. No pertinent surgical history.  Family Psychiatric History: None reported  Family History:  Family History  Problem Relation Age of Onset   Hypertension Mother    Heart disease Maternal Grandmother    Hyperlipidemia Maternal Grandmother     Social History:  Social History   Socioeconomic History   Marital status: Single    Spouse name: Not on file   Number of children: Not on file   Years of education: Not on file   Highest education level: Not on file  Occupational History   Not on file  Tobacco Use   Smoking status: Every Day    Packs/day: 0.50    Years: 10.00    Additional pack years: 0.00    Total pack years: 5.00    Types: Cigarettes    Smokeless tobacco: Never  Vaping Use   Vaping Use: Never used  Substance and Sexual Activity   Alcohol use: Yes    Alcohol/week: 3.0 standard drinks of alcohol    Types: 1 Glasses of wine, 2 Cans of beer per week   Drug use: No   Sexual activity: Yes    Birth control/protection: Condom  Other Topics Concern   Not on file  Social History Narrative   Not on file   Social Determinants of Health   Financial Resource Strain: Not on file  Food Insecurity: Not on file  Transportation Needs: Not on file  Physical Activity: Not on file  Stress: Not on file  Social Connections: Not on file    Allergies: No Known Allergies  Metabolic Disorder Labs: Lab Results  Component Value Date   HGBA1C 5.5 01/25/2016   No results found for: "PROLACTIN" Lab Results  Component Value Date   CHOL 149 01/03/2021   TRIG 54.0 01/03/2021   HDL 41.60 01/03/2021   CHOLHDL 4 01/03/2021   VLDL 10.8 01/03/2021   LDLCALC 97 01/03/2021   LDLCALC 120 (H) 09/08/2018   Lab Results  Component Value Date   TSH 0.714 03/27/2017   TSH 0.81 01/25/2016    Therapeutic Level Labs: No results found for: "LITHIUM" No results found for: "VALPROATE" No results found for: "CBMZ"  Current Medications: Current Outpatient Medications  Medication Sig Dispense Refill   amLODipine (NORVASC) 10 MG tablet Take 1  tablet (10 mg total) by mouth daily. 90 tablet 3   ARIPiprazole ER (ABILIFY MAINTENA) 400 MG PRSY prefilled syringe Inject 400 mg into the muscle every 28 (twenty-eight) days. 1 each 11   cyclobenzaprine (FLEXERIL) 5 MG tablet Take 1 tablet (5 mg total) by mouth 2 (two) times daily as needed for muscle spasms. 30 tablet 1   hydrochlorothiazide (MICROZIDE) 12.5 MG capsule Take 1 capsule (12.5 mg total) by mouth daily. 90 capsule 1   ibuprofen (ADVIL,MOTRIN) 200 MG tablet Take 200 mg by mouth every 6 (six) hours as needed.     meclizine (ANTIVERT) 25 MG tablet Take 1 tablet (25 mg total) by mouth 3 (three)  times daily as needed for dizziness. 30 tablet 0   naproxen (NAPROSYN) 500 MG tablet Take 1 tablet (500 mg total) by mouth 2 (two) times daily. 30 tablet 0   traZODone (DESYREL) 50 MG tablet Take 1 tablet (50 mg total) by mouth at bedtime as needed for sleep. 30 tablet 3   Current Facility-Administered Medications  Medication Dose Route Frequency Provider Last Rate Last Admin   ARIPiprazole ER (ABILIFY MAINTENA) 400 MG prefilled syringe 400 mg  400 mg Intramuscular Q28 days Eulis Canner E, NP   400 mg at 01/22/23 1602     Musculoskeletal: Strength & Muscle Tone: within normal limits Gait & Station: normal Patient leans: N/A  Psychiatric Specialty Exam: Review of Systems  There were no vitals taken for this visit.There is no height or weight on file to calculate BMI.  General Appearance: Casual and Neat  Eye Contact:  Good  Speech:  Clear and Coherent and Normal Rate  Volume:  Normal  Mood:  Euthymic  Affect:  Appropriate and Congruent  Thought Process:  Coherent, Goal Directed, and Descriptions of Associations: Intact  Orientation:  Full (Time, Place, and Person)  Thought Content: Logical   Suicidal Thoughts:  No  Homicidal Thoughts:  No  Memory:  Immediate;   Good Recent;   Good Remote;   Good  Judgement:  Intact  Insight:  Present  Psychomotor Activity:  Normal  Concentration:  Concentration: Good and Attention Span: Good  Recall:  Good  Fund of Knowledge: Good  Language: Good  Akathisia:  No  Handed:  Right  AIMS (if indicated): done  Assets:  Communication Skills Desire for Improvement Financial Resources/Insurance Housing Leisure Time Physical Health Social Support  ADL's:  Intact  Cognition: WNL  Sleep:  Good   Screenings: [place] Office Visit from 01/22/2023 in [place] Total Score 0      GAD-7    [place] Office Visit from 01/22/2023 in [facility] Video  Visit from 08/04/2022 in [facility] Video Visit from 04/30/2022 in [facility] Video Visit from 11/06/2021 in [place] from [facility]/05/2021 in [facility]  Total GAD-7 Score 0 9 7 12 14       PHQ2-9    [place] Office Visit from 01/22/2023 in [facility] Video Visit from 08/04/2022 in [facility] Video Visit from 04/30/2022 in [facility] Video Visit from 11/06/2021 in [place] from 08/05/2021 in [facility]  PHQ-2 Total Score 0 2 3 3 3   PHQ-9 Total Score -- 12 12 12  13  [place] Office Visit from 01/22/2023 in [facility] Video Visit from 11/06/2021 in [place] from 08/05/2021 in [place] No Risk No Risk No Risk      Assessment and Plan: Arrayah Sopp appears to be doing well.  She reports that medications are effective and managing her mental health without any adverse reaction.  During visit she is dressed appropriate for weather.  She is sitting upright in chair with no noted distress.  She is alert/oriented x 4, calm/cooperative and mood is congruent with affect.  She spoke in a clear tone at moderate volume, and normal pace, with good eye contact.  Her thought process is coherent and relevant, and there is no indication that she is currently responding to internal/external stimuli, or experiencing delusional thought content.  She denies depression, anxiety, suicidal/self-harm/homicidal ideation, psychosis, paranoia, and fluctuations in mood.   Collaboration of Care: Collaboration of Care: Medication Management AEB Medication refill Meds ordered this encounter   Medications   traZODone (DESYREL) 50 MG tablet    Sig: Take 1 tablet (50 mg total) by mouth at bedtime as needed for sleep.    Dispense:  30 tablet    Refill:  3    Order Specific Question:   Supervising Provider    Answer:   Hampton Abbot [[id number]]   ARIPiprazole ER (ABILIFY MAINTENA) 400 MG PRSY prefilled syringe    Sig: Inject 400 mg into the muscle every 28 (twenty-eight) days.    Dispense:  1 each    Refill:  11    Order Specific Question:   Supervising Provider    Answer:   Hampton Abbot [id number]     Patient/Guardian was advised Release of Information must be obtained prior to any record release in order to collaborate their care with an outside provider. Patient/Guardian was advised if they have not already done so to contact the registration department to sign all necessary forms in order for [place] to release information regarding their care.   Consent: Patient/Guardian gives verbal consent for treatment and assignment of benefits for services provided during this visit. Patient/Guardian expressed understanding and agreed to proceed.    Taneal Sonntag, NP 01/22/2023, 4:58 PM

## 2023-01-22 NOTE — Patient Instructions (Signed)
Patient arrived for::  ARIPiprazole ER (ABILIFY MAINTENA) 400 MG prefilled syringe  tolerated well in Right Arm. No Complaints Doing Well

## 2023-01-22 NOTE — Progress Notes (Signed)
Patient arrived for::  ARIPiprazole ER (ABILIFY MAINTENA) 400 MG prefilled syringe  tolerated well in Right Arm. No Complaints Doing Well

## 2023-02-04 ENCOUNTER — Telehealth (HOSPITAL_COMMUNITY): Payer: 59 | Admitting: Student in an Organized Health Care Education/Training Program

## 2023-02-19 ENCOUNTER — Ambulatory Visit (HOSPITAL_COMMUNITY): Payer: 59 | Admitting: *Deleted

## 2023-02-19 ENCOUNTER — Encounter (HOSPITAL_COMMUNITY): Payer: Self-pay

## 2023-02-19 VITALS — BP 124/89 | HR 74 | Ht 62.0 in | Wt 235.6 lb

## 2023-02-19 DIAGNOSIS — F315 Bipolar disorder, current episode depressed, severe, with psychotic features: Secondary | ICD-10-CM

## 2023-02-19 NOTE — Progress Notes (Signed)
Patient arrived for injection of Abilify Maintena  given in Left Deltoid without issues or complaints. Smiling, pleasant, and discussing her upcoming Birthday. Will return in 28 days.

## 2023-03-19 ENCOUNTER — Ambulatory Visit (INDEPENDENT_AMBULATORY_CARE_PROVIDER_SITE_OTHER): Payer: 59 | Admitting: *Deleted

## 2023-03-19 ENCOUNTER — Encounter (HOSPITAL_COMMUNITY): Payer: Self-pay

## 2023-03-19 VITALS — BP 133/96 | HR 94 | Ht 62.0 in | Wt 236.0 lb

## 2023-03-19 DIAGNOSIS — F319 Bipolar disorder, unspecified: Secondary | ICD-10-CM

## 2023-03-19 NOTE — Progress Notes (Signed)
In as scheduled for her monthly shot of abilify m 400 mg, given today in her R DELTOID without issue. She is in good spirits only complaint is being tired. Tomorrow is her birthday. She continues to work third shift but would like to move to first shift at some point. Grandma continues to live with her. She is to return in 28 days for her next inj.

## 2023-04-16 ENCOUNTER — Encounter (HOSPITAL_COMMUNITY): Payer: Self-pay

## 2023-04-16 ENCOUNTER — Ambulatory Visit (INDEPENDENT_AMBULATORY_CARE_PROVIDER_SITE_OTHER): Payer: 59 | Admitting: *Deleted

## 2023-04-16 ENCOUNTER — Ambulatory Visit (INDEPENDENT_AMBULATORY_CARE_PROVIDER_SITE_OTHER): Payer: 59 | Admitting: Psychiatry

## 2023-04-16 VITALS — BP 126/94 | HR 75 | Wt 232.0 lb

## 2023-04-16 DIAGNOSIS — F319 Bipolar disorder, unspecified: Secondary | ICD-10-CM

## 2023-04-16 MED ORDER — TRAZODONE HCL 50 MG PO TABS: 50.0000 mg | ORAL_TABLET | Freq: Every evening | ORAL | 3 refills | Status: DC | PRN

## 2023-04-16 MED ORDER — ARIPIPRAZOLE ER 400 MG IM PRSY: 400.0000 mg | PREFILLED_SYRINGE | INTRAMUSCULAR | 11 refills | Status: DC

## 2023-04-16 NOTE — Progress Notes (Cosign Needed)
Patient arrived for injection Aripiprazole ER (ABILIFY MAINTENA) 400 MG prefilled syringe - Tolerated injection well in Left Arm  Pleasant as Always -Everything is  going well.  NO SI/HI  NOR VH/AH 

## 2023-04-16 NOTE — Progress Notes (Signed)
BH MD/PA/NP OP Progress Note         04/16/2023 4:16 PM Amy Sexton  MRN:  [id number]  Chief Complaint: "I'm doing okay"  HPI: 37 year old female seen today for follow-up psychiatric evaluation. She has a psychiatric history of Bipolar disorder, ADHD, and tobacco dependence. She is currently managed on Abilify Maintena 400 mg monthly injection. Patient reports at times she take an old prescription of Trazodone but reports that this is infrequent.  She noted that her medications are effective in managing her psychiatric evaluations.     Today she was well-groomed, pleasant, cooperative, engaged in conversation, maintained eye contact.  She informed Clinical research associate that she has been doing okay. She notes that recently her friend and church member passed away. She Production manager that she has been able to cope with this loss. Patient notes her mood is stable and reports that she has mininal anxiety and depression.  Today provider conducted a GAD-7 and patient scored a 0.  Provider also conducted PHQ-9 and patient scored a 9.  She endorses adequate sleep and appetite.  Today she denies SI/HI/AVH, mania, paranoia.    No medication changes made today.  Patient agreeable to continue medications prescribed. At this time she is not in need of a refill of trazodone.   No other concerns at this time.        Visit Diagnosis:    ICD-10-CM   1. Bipolar I disorder (HCC)  F31.9 ARIPiprazole ER (ABILIFY MAINTENA) 400 MG PRSY prefilled syringe    traZODone (DESYREL) 50 MG tablet       Past Psychiatric History: Bipolar disorder, ADHD, and tobacco dependence.  Past Medical History:  Past Medical History:  Diagnosis Date   ADHD (attention deficit hyperactivity disorder)    Asthma    No past surgical history on file.  Family Psychiatric History: Denies  Family History:  Family History  Problem Relation Age of Onset   Hypertension Mother    Heart disease Maternal Grandmother    Hyperlipidemia  Maternal Grandmother     Social History:  Social History   Socioeconomic History   Marital status: Single    Spouse name: Not on file   Number of children: Not on file   Years of education: Not on file   Highest education level: Not on file  Occupational History   Not on file  Tobacco Use   Smoking status: Every Day    Packs/day: 0.50    Years: 10.00    Additional pack years: 0.00    Total pack years: 5.00    Types: Cigarettes   Smokeless tobacco: Never  Vaping Use   Vaping Use: Never used  Substance and Sexual Activity   Alcohol use: Yes    Alcohol/week: 3.0 standard drinks of alcohol    Types: 1 Glasses of wine, 2 Cans of beer per week   Drug use: No   Sexual activity: Yes    Birth control/protection: Condom  Other Topics Concern   Not on file  Social History Narrative   Not on file   Social Determinants of Health   Financial Resource Strain: Not on file  Food Insecurity: Not on file  Transportation Needs: Not on file  Physical Activity: Not on file  Stress: Not on file  Social Connections: Not on file    Allergies: No Known Allergies  Metabolic Disorder Labs: Lab Results  Component Value Date   HGBA1C 5.5 01/25/2016   No results found for: "PROLACTIN" Lab Results  Component  Value Date   CHOL 149 01/03/2021   TRIG 54.0 01/03/2021   HDL 41.60 01/03/2021   CHOLHDL 4 01/03/2021   VLDL 10.8 01/03/2021   LDLCALC 97 01/03/2021   LDLCALC 120 (H) 09/08/2018   Lab Results  Component Value Date   TSH 0.714 03/27/2017   TSH 0.81 01/25/2016    Therapeutic Level Labs: No results found for: "LITHIUM" No results found for: "VALPROATE" No results found for: "CBMZ"  Current Medications: Current Outpatient Medications  Medication Sig Dispense Refill   ARIPiprazole ER (ABILIFY MAINTENA) 400 MG PRSY prefilled syringe Inject 400 mg into the muscle every 28 (twenty-eight) days. 1 each 11   cyclobenzaprine (FLEXERIL) 5 MG tablet Take 1 tablet (5 mg total) by  mouth 2 (two) times daily as needed for muscle spasms. 30 tablet 1   hydrochlorothiazide (MICROZIDE) 12.5 MG capsule Take 1 capsule (12.5 mg total) by mouth daily. 90 capsule 1   ibuprofen (ADVIL,MOTRIN) 200 MG tablet Take 200 mg by mouth every 6 (six) hours as needed.     meclizine (ANTIVERT) 25 MG tablet Take 1 tablet (25 mg total) by mouth 3 (three) times daily as needed for dizziness. 30 tablet 0   naproxen (NAPROSYN) 500 MG tablet Take 1 tablet (500 mg total) by mouth 2 (two) times daily. 30 tablet 0   traZODone (DESYREL) 50 MG tablet Take 1 tablet (50 mg total) by mouth at bedtime as needed for sleep. 30 tablet 3   Current Facility-Administered Medications  Medication Dose Route Frequency Provider Last Rate Last Admin   ARIPiprazole ER (ABILIFY MAINTENA) 400 MG prefilled syringe 400 mg  400 mg Intramuscular Q28 days Toy Cookey E, NP   400 mg at 03/19/23 1517     Musculoskeletal: Strength & Muscle Tone: within normal limits Gait & Station: normal Patient leans: N/A  Psychiatric Specialty Exam: Review of Systems  There were no vitals taken for this visit.There is no height or weight on file to calculate BMI.  General Appearance: Well Groomed  Eye Contact:  Good  Speech:  Clear and Coherent and Normal Rate  Volume:  Normal  Mood:  Euthymic,   Affect:  Appropriate and Congruent  Thought Process:  Coherent, Goal Directed and Linear  Orientation:  Full (Time, Place, and Person)  Thought Content: WDL and Logical   Suicidal Thoughts:  No  Homicidal Thoughts:  No  Memory:  Immediate;   Good Recent;   Good Remote;   Good  Judgement:  Good  Insight:  Good  Psychomotor Activity:  Normal  Concentration:  Concentration: Good and Attention Span: Good  Recall:  Good  Fund of Knowledge: Good  Language: Good  Akathisia:  No  Handed:  Right  AIMS (if indicated):Not done  Assets:  Communication Skills Desire for Improvement Financial Resources/Insurance Housing Social  Support  ADL's:  Intact  Cognition: WNL  Sleep:  Good   Screenings: AIMS    Flowsheet Row Office Visit from 01/22/2023 in [facility]  AIMS Total Score 0      GAD-7    Flowsheet Row Office Visit from 04/16/2023 in [facility] Office Visit from 01/22/2023 in [facility] Video Visit from 08/04/2022 in [facility] Video Visit from 04/30/2022 in [facility] Video Visit from 11/06/2021 in [facility]  Total GAD-7 Score 0 0 9 7 12       PHQ2-9    Flowsheet Row  Office Visit from 04/16/2023 in [facility] Office Visit from 01/22/2023 in [facility] Video Visit from 08/04/2022 in [facility] Video Visit from 04/30/2022 in [facility] Video Visit from 11/06/2021 in [facility]  PHQ-2 Total Score 1 0 2 3 3   PHQ-9 Total Score 9 -- 12 12 12       Flowsheet Row Office Visit from 04/16/2023 in [facility] Office Visit from 01/22/2023 in [facility] Video Visit from 11/06/2021 in [facility]  C-SSRS RISK CATEGORY No Risk No Risk No Risk        Assessment and Plan: Patient reports that she is doing well on her current medication regimen.  She informed Clinical research associate that she takes trazodone periodically. At this time she doe not need a refill of trazodone. No medication changes made today. Patient agreeable to continue medications as prescribed.   1. Bipolar I disorder (HCC)  Continue- ARIPiprazole ER (ABILIFY MAINTENA) 400 MG PRSY prefilled syringe; Inject 400 mg into the muscle every 28 (twenty-eight) days.  Dispense: 1 each; Refill: 11 Continue- traZODone (DESYREL) 50 MG tablet; Take 1 tablet (50 mg total) by mouth at  bedtime as needed for sleep.  Dispense: 30 tablet; Refill: 3       Follow up in 3 months Follow up with therapy.     Shanna Cisco, NP 04/16/2023, 4:16 PM

## 2023-04-16 NOTE — Patient Instructions (Signed)
Patient arrived for injection Aripiprazole ER (ABILIFY MAINTENA) 400 MG prefilled syringe - Tolerated injection well in Left Arm  Pleasant as Always -Everything is  going well.  NO SI/HI  NOR VH/AH

## 2023-05-14 ENCOUNTER — Encounter (HOSPITAL_COMMUNITY): Payer: Self-pay

## 2023-05-14 ENCOUNTER — Ambulatory Visit (INDEPENDENT_AMBULATORY_CARE_PROVIDER_SITE_OTHER): Payer: 59 | Admitting: *Deleted

## 2023-05-14 VITALS — BP 118/87 | HR 78 | Resp 16 | Ht 62.0 in | Wt 231.2 lb

## 2023-05-14 DIAGNOSIS — F319 Bipolar disorder, unspecified: Secondary | ICD-10-CM | POA: Diagnosis not present

## 2023-05-14 NOTE — Progress Notes (Cosign Needed)
Patient arrived for injection of Abilify Maintena 400mg . Given in Right Deltoid without issues or complaints. States the medication is working well. No side effects. Pleasant cooperative.

## 2023-06-11 ENCOUNTER — Ambulatory Visit (INDEPENDENT_AMBULATORY_CARE_PROVIDER_SITE_OTHER): Payer: 59 | Admitting: *Deleted

## 2023-06-11 ENCOUNTER — Encounter (HOSPITAL_COMMUNITY): Payer: Self-pay

## 2023-06-11 VITALS — BP 115/81 | HR 73 | Resp 16 | Ht 62.0 in | Wt 222.0 lb

## 2023-06-11 DIAGNOSIS — F315 Bipolar disorder, current episode depressed, severe, with psychotic features: Secondary | ICD-10-CM | POA: Diagnosis not present

## 2023-06-11 NOTE — Progress Notes (Cosign Needed)
Patient arrived for injection of Abilify Maintena 400mg . Given in Left Deltoid without issue or complaint. States her brother was killed in a car accident last week. He had just moved to [place] and hit a telephone pole on his way home from work when it was raining. Expressed condolence. Denies any side effects from medication. States all is working well for her.

## 2023-07-16 ENCOUNTER — Encounter (HOSPITAL_COMMUNITY): Payer: Self-pay

## 2023-07-16 ENCOUNTER — Encounter (HOSPITAL_COMMUNITY): Payer: Self-pay | Admitting: Student

## 2023-07-16 ENCOUNTER — Ambulatory Visit (INDEPENDENT_AMBULATORY_CARE_PROVIDER_SITE_OTHER): Payer: 59

## 2023-07-16 ENCOUNTER — Ambulatory Visit (INDEPENDENT_AMBULATORY_CARE_PROVIDER_SITE_OTHER): Payer: 59 | Admitting: Student

## 2023-07-16 VITALS — BP 125/91 | HR 77 | Ht 62.0 in | Wt 224.2 lb

## 2023-07-16 DIAGNOSIS — Z79899 Other long term (current) drug therapy: Secondary | ICD-10-CM

## 2023-07-16 DIAGNOSIS — F411 Generalized anxiety disorder: Secondary | ICD-10-CM

## 2023-07-16 DIAGNOSIS — Z0000 Encounter for general adult medical examination without abnormal findings: Secondary | ICD-10-CM | POA: Diagnosis not present

## 2023-07-16 DIAGNOSIS — F200 Paranoid schizophrenia: Secondary | ICD-10-CM

## 2023-07-16 DIAGNOSIS — F17200 Nicotine dependence, unspecified, uncomplicated: Secondary | ICD-10-CM | POA: Diagnosis not present

## 2023-07-16 DIAGNOSIS — G4700 Insomnia, unspecified: Secondary | ICD-10-CM

## 2023-07-16 DIAGNOSIS — F319 Bipolar disorder, unspecified: Secondary | ICD-10-CM

## 2023-07-16 NOTE — Progress Notes (Signed)
BH MD/PA/NP OP Progress Note   07/16/2023 3:40 PM Amy Sexton  MRN:  [id number]  Chief Complaint: LAI follow-up  HPI: 37 year old female seen today for follow-up psychiatric evaluation. She has a psychiatric history of Bipolar disorder, ADHD, and tobacco dependence. She is currently managed on Abilify Maintena 400 mg monthly injection. As well, she takes Trazodone as needed for sleep, which she has not required in a while. She reports that her medications are adequately managing her symptoms. She voices no acute concerns.   Today she was fairly-groomed, pleasant, cooperative, engaged in conversation, maintained eye contact.  She informed Clinical research associate that she has been doing okay. She has had some days of sadness as she has been grieving the recent loss of her brother. Her mood has otherwise been stable. She has been sleeping well without the use of Trazodone. Her appetite is intact. She has sufficient energy, motivation, and concentration to ensure her responsibilities are managed. She again notes minimal symptoms of anxiety.  She denies SI/HI/AVH, mania, and paranoia.    No medication changes made today.  Patient agreeable to continue medications prescribed. At this time she is not in need of a refill of trazodone.  Patient reports not taking anti-hypertensive medication and has not followed up with PCP in over a year.   Visit Diagnosis:    ICD-10-CM   1. Bipolar I disorder (HCC)  F31.9     2. Tobacco dependence  F17.200         Past Psychiatric History: Bipolar disorder, ADHD, and tobacco dependence.  Past Medical History:  Past Medical History:  Diagnosis Date   ADHD (attention deficit hyperactivity disorder)    Asthma    No past surgical history on file.  Family Psychiatric History: Denies  Family History:  Family History  Problem Relation Age of Onset   Hypertension Mother    Heart disease Maternal Grandmother    Hyperlipidemia Maternal Grandmother     Social History:   Social History   Socioeconomic History   Marital status: Single    Spouse name: Not on file   Number of children: Not on file   Years of education: Not on file   Highest education level: Not on file  Occupational History   Not on file  Tobacco Use   Smoking status: Every Day    Current packs/day: 0.50    Average packs/day: 0.5 packs/day for 10.0 years (5.0 ttl pk-yrs)    Types: Cigarettes   Smokeless tobacco: Never  Vaping Use   Vaping status: Never Used  Substance and Sexual Activity   Alcohol use: Yes    Alcohol/week: 3.0 standard drinks of alcohol    Types: 1 Glasses of wine, 2 Cans of beer per week   Drug use: No   Sexual activity: Yes    Birth control/protection: Condom  Other Topics Concern   Not on file  Social History Narrative   Not on file   Social Determinants of Health   Financial Resource Strain: Not on file  Food Insecurity: Not on file  Transportation Needs: Not on file  Physical Activity: Not on file  Stress: Not on file  Social Connections: Not on file    Allergies: No Known Allergies  Metabolic Disorder Labs: Lab Results  Component Value Date   HGBA1C 5.5 01/25/2016   No results found for: "PROLACTIN" Lab Results  Component Value Date   CHOL 149 01/03/2021   TRIG 54.0 01/03/2021   HDL 41.60 01/03/2021   CHOLHDL 4 01/03/2021  VLDL 10.8 01/03/2021   LDLCALC 97 01/03/2021   LDLCALC 120 (H) 09/08/2018   Lab Results  Component Value Date   TSH 0.714 03/27/2017   TSH 0.81 01/25/2016    Therapeutic Level Labs: No results found for: "LITHIUM" No results found for: "VALPROATE" No results found for: "CBMZ"  Current Medications: Current Outpatient Medications  Medication Sig Dispense Refill   ARIPiprazole ER (ABILIFY MAINTENA) 400 MG PRSY prefilled syringe Inject 400 mg into the muscle every 28 (twenty-eight) days. 1 each 11   cyclobenzaprine (FLEXERIL) 5 MG tablet Take 1 tablet (5 mg total) by mouth 2 (two) times daily as needed for  muscle spasms. 30 tablet 1   hydrochlorothiazide (MICROZIDE) 12.5 MG capsule Take 1 capsule (12.5 mg total) by mouth daily. 90 capsule 1   ibuprofen (ADVIL,MOTRIN) 200 MG tablet Take 200 mg by mouth every 6 (six) hours as needed.     meclizine (ANTIVERT) 25 MG tablet Take 1 tablet (25 mg total) by mouth 3 (three) times daily as needed for dizziness. 30 tablet 0   naproxen (NAPROSYN) 500 MG tablet Take 1 tablet (500 mg total) by mouth 2 (two) times daily. 30 tablet 0   traZODone (DESYREL) 50 MG tablet Take 1 tablet (50 mg total) by mouth at bedtime as needed for sleep. 30 tablet 3   Current Facility-Administered Medications  Medication Dose Route Frequency Provider Last Rate Last Admin   ARIPiprazole ER (ABILIFY MAINTENA) 400 MG prefilled syringe 400 mg  400 mg Intramuscular Q28 days Toy Cookey E, NP   400 mg at 07/16/23 1455     Musculoskeletal: Strength & Muscle Tone: within normal limits Gait & Station: normal Patient leans: N/A  Psychiatric Specialty Exam: Review of Systems  Constitutional:  Negative for activity change, appetite change and unexpected weight change.  Gastrointestinal: Negative.   Genitourinary: Negative.   Neurological:  Negative for dizziness, seizures and headaches.    There were no vitals taken for this visit.There is no height or weight on file to calculate BMI. See LAI nursing encounter  General Appearance: Casual and Fairly Groomed  Eye Contact:  Good  Speech:  Clear and Coherent and Normal Rate  Volume:  Normal  Mood:  Euthymic,   Affect:  Appropriate and Congruent  Thought Process:  Coherent, Goal Directed and Linear  Orientation:  Full (Time, Place, and Person)  Thought Content: WDL and Logical   Suicidal Thoughts:  No  Homicidal Thoughts:  No  Memory:  Immediate;   Good Recent;   Good Remote;   Good  Judgement:  Fair  Insight:  Good  Psychomotor Activity:  Normal  Concentration:  Concentration: Good and Attention Span: Good  Recall:   Good  Fund of Knowledge: Fair  Language: Good  Akathisia:  No  Handed:  Right  AIMS (if indicated):Not done  Assets:  Communication Skills Desire for Improvement Financial Resources/Insurance Housing Social Support  ADL's:  Intact  Cognition: WNL  Sleep:  Good   Screenings: AIMS    Flowsheet Row Office Visit from 01/22/2023 in [facility]  AIMS Total Score 0      GAD-7    Flowsheet Row Office Visit from 04/16/2023 in [facility] Office Visit from 01/22/2023 in [facility] Video Visit from 08/04/2022 in [facility] Video Visit from 04/30/2022 in [facility] Video Visit from 11/06/2021 in [facility]  Total GAD-7 Score 0 0  9 7 12       PHQ2-9    Flowsheet Row Office Visit from 04/16/2023 in [facility] Office Visit from 01/22/2023 in [facility] Video Visit from 08/04/2022 in [facility] Video Visit from 04/30/2022 in [facility] Video Visit from 11/06/2021 in [facility]  PHQ-2 Total Score 1 0 2 3 3   PHQ-9 Total Score 9 -- 12 12 12       Flowsheet Row Office Visit from 04/16/2023 in [facility] Office Visit from 01/22/2023 in [facility] Video Visit from 11/06/2021 in [facility]  C-SSRS RISK CATEGORY No Risk No Risk No Risk        Assessment and Plan: Patient reports that she is doing well on her current medication regimen.  She informed Clinical research associate that she continues to take trazodone periodically. At this time she does not need a refill of trazodone. No medication changes made today. Patient agreeable to continue medications as prescribed.   Patient to follow-up with this Clinical research associate as primary  psychiatric provider in 8 weeks. As she has been without symptoms of mania/hypomania, it appears, for the past couple of years, will explore this diagnosis further. Patient also without labs for the past 2 years, so scheduled for routine monitoring labs on 9/30.   1. Bipolar I disorder (HCC)  - Continue- ARIPiprazole ER (ABILIFY MAINTENA) 400 MG PRSY prefilled syringe; Inject 400 mg into the muscle every 28 (twenty-eight) days.  - Continue- traZODone (DESYREL) 50 MG tablet; Take 1 tablet (50 mg total) by mouth at bedtime as needed for sleep.  - Routine monitoring labs: CBC, CMET, A1c, lipid panel, and TSH.  2. Tobacco Dependence - Counseled on cessation. Patient still in pre-contemplative stage.      Follow up in 2 months for MM Follow up in 28 days for LAI Follow-up 9/30 for labs    Lamar Sprinkles, MD 07/16/2023, 3:40 PM

## 2023-07-16 NOTE — Progress Notes (Cosign Needed)
In as scheduled for her monthly injection of Abilify M 400 mg given today in her R Deltoid without difficulty. She continues to work, grandma lives with her and there are no changes since her last visit. States she is doing well. She is to return in one month for her next injection. No complaints voiced

## 2023-07-27 ENCOUNTER — Other Ambulatory Visit (HOSPITAL_COMMUNITY): Payer: 59

## 2023-08-13 ENCOUNTER — Ambulatory Visit (INDEPENDENT_AMBULATORY_CARE_PROVIDER_SITE_OTHER): Payer: 59

## 2023-08-13 ENCOUNTER — Encounter (HOSPITAL_COMMUNITY): Payer: Self-pay

## 2023-08-13 VITALS — BP 118/71 | HR 72 | Temp 98.2°F | Ht 63.75 in | Wt 230.0 lb

## 2023-08-13 DIAGNOSIS — F200 Paranoid schizophrenia: Secondary | ICD-10-CM

## 2023-08-13 NOTE — Progress Notes (Signed)
Patient in today for due Abilify Maintena 400 mg IM every 28 day injection. Patient presented with flat affect, pleasant mood and denied any current symptoms, no suicidal or homicidal ideations, plan, intent or means to want to harm self or others and no auditory or visual hallucinations.  Patient stated no problems at this time and that she was doing well with due Abilify Maintena injection.  Injection prepared as ordered with sample as patient stated she did not qualify for assistance and did not have the medication with her today.  Abilify Maintena 400 mg IM prefilled injection given to patient in her left deltoid area tolerated without complaint of pain or discomfort and patient agreed to return in 4 weeks for next due injection.  Patient agreed to call if any issues or concerns prior to next due injection date.

## 2023-08-31 ENCOUNTER — Other Ambulatory Visit (HOSPITAL_COMMUNITY): Payer: 59

## 2023-08-31 DIAGNOSIS — Z0189 Encounter for other specified special examinations: Secondary | ICD-10-CM

## 2023-08-31 NOTE — Progress Notes (Signed)
LABS WERE DRAWN WITH NO COMPLAINTS OR ISSUES

## 2023-09-01 ENCOUNTER — Other Ambulatory Visit (HOSPITAL_COMMUNITY): Payer: Self-pay | Admitting: Psychiatry

## 2023-09-02 LAB — COMPREHENSIVE METABOLIC PANEL
ALT: 20 [IU]/L (ref 0–32)
AST: 19 [IU]/L (ref 0–40)
Albumin: 3.9 g/dL (ref 3.9–4.9)
Alkaline Phosphatase: 79 [IU]/L (ref 44–121)
BUN/Creatinine Ratio: 9 (ref 9–23)
BUN: 7 mg/dL (ref 6–20)
Bilirubin Total: 0.3 mg/dL (ref 0.0–1.2)
CO2: 20 mmol/L (ref 20–29)
Calcium: 9 mg/dL (ref 8.7–10.2)
Chloride: 106 mmol/L (ref 96–106)
Creatinine, Ser: 0.81 mg/dL (ref 0.57–1.00)
Globulin, Total: 2.7 g/dL (ref 1.5–4.5)
Glucose: 99 mg/dL (ref 70–99)
Potassium: 4.5 mmol/L (ref 3.5–5.2)
Sodium: 138 mmol/L (ref 134–144)
Total Protein: 6.6 g/dL (ref 6.0–8.5)
eGFR: 95 mL/min/{1.73_m2} (ref >59)

## 2023-09-02 LAB — LIPID PANEL
Chol/HDL Ratio: 3.4 ratio (ref 0.0–4.4)
Cholesterol, Total: 144 mg/dL (ref 100–199)
HDL: 42 mg/dL (ref >39)
LDL Chol Calc (NIH): 89 mg/dL (ref 0–99)
Triglycerides: 64 mg/dL (ref 0–149)
VLDL Cholesterol Cal: 13 mg/dL (ref 5–40)

## 2023-09-02 LAB — HEMOGLOBIN A1C
Est. average glucose Bld gHb Est-mCnc: 126 mg/dL
Hgb A1c MFr Bld: 6 % — ABNORMAL HIGH (ref 4.8–5.6)

## 2023-09-02 LAB — SPECIMEN STATUS REPORT

## 2023-09-02 LAB — CBC WITH DIFFERENTIAL/PLATELET
Basophils Absolute: 0 10*3/uL (ref 0.0–0.2)
Basos: 0 %
EOS (ABSOLUTE): 0.5 10*3/uL — ABNORMAL HIGH (ref 0.0–0.4)
Eos: 5 %
Hematocrit: 41.1 % (ref 34.0–46.6)
Hemoglobin: 12.2 g/dL (ref 11.1–15.9)
Immature Grans (Abs): 0 10*3/uL (ref 0.0–0.1)
Immature Granulocytes: 0 %
Lymphocytes Absolute: 3.5 10*3/uL — ABNORMAL HIGH (ref 0.7–3.1)
Lymphs: 37 %
MCH: 21.9 pg — ABNORMAL LOW (ref 26.6–33.0)
MCHC: 29.7 g/dL — ABNORMAL LOW (ref 31.5–35.7)
MCV: 74 fL — ABNORMAL LOW (ref 79–97)
Monocytes Absolute: 0.5 10*3/uL (ref 0.1–0.9)
Monocytes: 6 %
Neutrophils Absolute: 4.8 10*3/uL (ref 1.4–7.0)
Neutrophils: 52 %
Platelets: 247 10*3/uL (ref 150–450)
RBC: 5.57 x10E6/uL — ABNORMAL HIGH (ref 3.77–5.28)
RDW: 17.3 % — ABNORMAL HIGH (ref 11.7–15.4)
WBC: 9.3 10*3/uL (ref 3.4–10.8)

## 2023-09-02 LAB — TSH: TSH: 1.41 u[IU]/mL (ref 0.450–4.500)

## 2023-09-10 ENCOUNTER — Encounter (HOSPITAL_COMMUNITY): Payer: Self-pay

## 2023-09-10 ENCOUNTER — Encounter (HOSPITAL_COMMUNITY): Payer: 59 | Admitting: Student

## 2023-09-10 ENCOUNTER — Ambulatory Visit (HOSPITAL_COMMUNITY): Payer: 59

## 2023-09-10 NOTE — Progress Notes (Cosign Needed)
In as scheduled for her monthly injection of Abilify M 400 mg given today in her L Deltoid without difficulty. She continues to work, grandma lives with her and there are no changes since her last visit. States she is doing well. She is to return in one month for her next injection. No complaints voiced

## 2023-10-08 ENCOUNTER — Encounter (HOSPITAL_COMMUNITY): Payer: Self-pay

## 2023-10-08 ENCOUNTER — Ambulatory Visit (HOSPITAL_COMMUNITY): Payer: 59

## 2023-10-08 ENCOUNTER — Ambulatory Visit (INDEPENDENT_AMBULATORY_CARE_PROVIDER_SITE_OTHER): Payer: 59 | Admitting: Student

## 2023-10-08 VITALS — BP 135/90 | HR 69 | Ht 63.75 in | Wt 234.0 lb

## 2023-10-08 DIAGNOSIS — F319 Bipolar disorder, unspecified: Secondary | ICD-10-CM | POA: Diagnosis not present

## 2023-10-08 DIAGNOSIS — Z0000 Encounter for general adult medical examination without abnormal findings: Secondary | ICD-10-CM | POA: Diagnosis not present

## 2023-10-08 DIAGNOSIS — Z79899 Other long term (current) drug therapy: Secondary | ICD-10-CM | POA: Diagnosis not present

## 2023-10-08 DIAGNOSIS — R7303 Prediabetes: Secondary | ICD-10-CM

## 2023-10-08 DIAGNOSIS — F17200 Nicotine dependence, unspecified, uncomplicated: Secondary | ICD-10-CM

## 2023-10-08 NOTE — Progress Notes (Signed)
BH MD/PA/NP OP Progress Note   10/08/2023 3:45 PM Amy Sexton  MRN:  [id number]  Chief Complaint: MM follow-up  HPI: 37 year old female seen today for follow-up psychiatric evaluation. She has a psychiatric history of Bipolar disorder and tobacco dependence as well as a reported history of ADHD not currently managed with medications. She is currently managed on Abilify Maintena 400 mg monthly injection. She reports that her medication is adequately managing her symptoms. She voices no acute concerns.   Today she was fairly-groomed, pleasant, cooperative, engaged in conversation, maintained eye contact.   Her mood has been stable. She has been sleeping well. Her appetite is variable, not eating at night while working. 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 She has sufficient energy, motivation, and concentration to ensure her responsibilities are managed. She notes some work stressors, but not anything that she is unable to manage. She denies SI/HI/AVH, mania, and paranoia.    No medication changes made today.  Patient agreeable to continue medication prescribed.   Denies akathisia, tremors, GI upset, N/V/D/C.   Diet is not heavy in fried foods.  Exercise is more of a problem; she is mostly sedentary.   Cigarettes: almost 1 ppd. Alcohol: Denies Illicit: Denies  Visit Diagnosis:  No diagnosis found.     Past Psychiatric History: Bipolar disorder, ADHD, and tobacco dependence.  Past Medical History:  Past Medical History:  Diagnosis Date   ADHD (attention deficit hyperactivity disorder)    Asthma    Bipolar I disorder, most recent episode (or current) depressed, severe, specified as with psychotic behavior (HCC) 05/30/2020   No past surgical history on file.  Family Psychiatric History: Denies  Family History:  Family History  Problem Relation Age of Onset   Hypertension Mother    Heart disease Maternal Grandmother    Hyperlipidemia Maternal Grandmother      Social History:  Social History   Socioeconomic History   Marital status: Single    Spouse name: Not on file   Number of children: Not on file   Years of education: Not on file   Highest education level: Not on file  Occupational History   Not on file  Tobacco Use   Smoking status: Every Day    Current packs/day: 0.50    Average packs/day: 0.5 packs/day for 10.0 years (5.0 ttl pk-yrs)    Types: Cigarettes   Smokeless tobacco: Never  Vaping Use   Vaping status: Never Used  Substance and Sexual Activity   Alcohol use: Not Currently   Drug use: No   Sexual activity: Yes    Birth control/protection: Condom  Other Topics Concern   Not on file  Social History Narrative   Not on file   Social Drivers of Health   Financial Resource Strain: Not on file  Food Insecurity: Not on file  Transportation Needs: Not on file  Physical Activity: Not on file  Stress: Not on file  Social Connections: Not on file    Allergies: No Known Allergies  Metabolic Disorder Labs: Lab Results  Component Value Date   HGBA1C 6.0 (H) 09/01/2023   No results found for: "PROLACTIN" Lab Results  Component Value Date   CHOL 144 09/01/2023   TRIG 64 09/01/2023   HDL 42 09/01/2023   CHOLHDL 3.4 09/01/2023   VLDL 46.9 01/03/2021   LDLCALC 89 09/01/2023   LDLCALC 97 01/03/2021   Lab Results  Component Value Date   TSH 1.410 09/01/2023   TSH 0.714 03/27/2017  Therapeutic Level Labs: No results found for: "LITHIUM" No results found for: "VALPROATE" No results found for: "CBMZ"  Current Medications: Current Outpatient Medications  Medication Sig Dispense Refill   ARIPiprazole ER (ABILIFY MAINTENA) 400 MG PRSY prefilled syringe Inject 400 mg into the muscle every 28 (twenty-eight) days. 1 each 11   cyclobenzaprine (FLEXERIL) 5 MG tablet Take 1 tablet (5 mg total) by mouth 2 (two) times daily as needed for muscle spasms. (Patient not taking: Reported on 07/16/2023) 30 tablet 1    hydrochlorothiazide (MICROZIDE) 12.5 MG capsule Take 1 capsule (12.5 mg total) by mouth daily. (Patient not taking: Reported on 07/16/2023) 90 capsule 1   ibuprofen (ADVIL,MOTRIN) 200 MG tablet Take 200 mg by mouth every 6 (six) hours as needed.     meclizine (ANTIVERT) 25 MG tablet Take 1 tablet (25 mg total) by mouth 3 (three) times daily as needed for dizziness. (Patient not taking: Reported on 07/16/2023) 30 tablet 0   naproxen (NAPROSYN) 500 MG tablet Take 1 tablet (500 mg total) by mouth 2 (two) times daily. (Patient not taking: Reported on 07/16/2023) 30 tablet 0   traZODone (DESYREL) 50 MG tablet Take 1 tablet (50 mg total) by mouth at bedtime as needed for sleep. 30 tablet 3   Current Facility-Administered Medications  Medication Dose Route Frequency Provider Last Rate Last Admin   ARIPiprazole ER (ABILIFY MAINTENA) 400 MG prefilled syringe 400 mg  400 mg Intramuscular Q28 days Toy Cookey E, NP   400 mg at 10/08/23 1532     Musculoskeletal: Strength & Muscle Tone: within normal limits Gait & Station: normal Patient leans: N/A  Psychiatric Specialty Exam: Review of Systems  Constitutional:  Negative for activity change, appetite change and unexpected weight change.  Gastrointestinal: Negative.   Genitourinary: Negative.   Neurological:  Negative for dizziness, seizures and headaches.    There were no vitals taken for this visit.There is no height or weight on file to calculate BMI. See LAI nursing encounter  General Appearance: Casual and Fairly Groomed  Eye Contact:  Good  Speech:  Clear and Coherent and Normal Rate  Volume:  Normal  Mood:  Euthymic,   Affect:  Appropriate and Congruent  Thought Process:  Coherent, Goal Directed and Linear  Orientation:  Full (Time, Place, and Person)  Thought Content: WDL and Logical   Suicidal Thoughts:  No  Homicidal Thoughts:  No  Memory:  Immediate;   Good Recent;   Good Remote;   Good  Judgement:  Fair  Insight:  Good   Psychomotor Activity:  Normal  Concentration:  Concentration: Good and Attention Span: Good  Recall:  Good  Fund of Knowledge: Fair  Language: Good  Akathisia:  No  Handed:  Right  AIMS (if indicated):Not done  Assets:  Communication Skills Desire for Improvement Financial Resources/Insurance Housing Social Support  ADL's:  Intact  Cognition: WNL  Sleep:  Good   Screenings: AIMS    Flowsheet Row [facility] Visit from 01/22/2023 in [facility]  AIMS Total Score 0      GAD-7    Flowsheet Row Office Visit from 04/16/2023 in [facility] Office Visit from 01/22/2023 in [facility] Video Visit from 08/04/2022 in [facility] Video Visit from 04/30/2022 in [facility] Video Visit from 11/06/2021 in [facility]  Total GAD-7 Score 0 0 9 7 12       PHQ2-9  Flowsheet Row Office Visit from 04/16/2023 in [facility] Office Visit from 01/22/2023 in [facility] Video Visit from 08/04/2022 in [facility] Video Visit from 04/30/2022 in [facility] Video Visit from 11/06/2021 in [facility]  PHQ-2 Total Score 1 0 2 3 3   PHQ-9 Total Score 9 -- 12 12 12       Flowsheet Row Office Visit from 04/16/2023 in [facility] Office Visit from 01/22/2023 in [facility] Video Visit from 11/06/2021 in [facility]  C-SSRS RISK CATEGORY No Risk No Risk No Risk        Assessment and Plan: Patient reports that she is doing well on her current medication regimen.  She informed Clinical research associate that she has not required trazodone.  We will formally discontinue trazodone at this time, but no further medication changes to be made today.  Patient agreeable to continue medications as prescribed.   As patient with prediabetic A1c, we will place referral to [facility] family practice for primary care, where she was previously seen.  Labs otherwise WNL.  As she has been without symptoms of mania/hypomania, it appears, for the past couple of years, will continue to explore this diagnosis further.  1. Bipolar I disorder (HCC)  - Continue- ARIPiprazole ER (ABILIFY MAINTENA) 400 MG PRSY prefilled syringe; Inject 400 mg into the muscle every 28 (twenty-eight) days.   2. Tobacco Dependence - Counseled on cessation. Patient still in pre-contemplative stage.      Follow up in 3 months for MM Follow up in 28 days for LAI    Lamar Sprinkles, MD 10/08/2023, 3:45 PM

## 2023-10-08 NOTE — Progress Notes (Cosign Needed)
In as scheduled for her monthly injection of Abilify M 400 mg given today in her R Deltoid without difficulty. She continues to work, grandma lives with her and there are no changes since her last visit. States she is doing well. She is to return in one month for her next injection. No complaints voiced

## 2023-10-11 NOTE — Addendum Note (Signed)
Addended by: Theodoro Kos A on: 10/11/2023 09:41 AM   Modules accepted: Level of Service

## 2023-11-05 ENCOUNTER — Encounter (HOSPITAL_COMMUNITY): Payer: Self-pay

## 2023-11-05 ENCOUNTER — Ambulatory Visit (INDEPENDENT_AMBULATORY_CARE_PROVIDER_SITE_OTHER): Payer: 59

## 2023-11-05 VITALS — BP 129/89 | HR 80 | Ht 62.0 in | Wt 235.0 lb

## 2023-11-05 DIAGNOSIS — F319 Bipolar disorder, unspecified: Secondary | ICD-10-CM | POA: Diagnosis not present

## 2023-11-05 NOTE — Progress Notes (Cosign Needed)
 Patient was seen today for the injection of  Abilify 400-MG in left deltoid. Pt had no complaints and tolerated injection well. Patient states that she had a good holiday season and was able to catch up on sleep and eat some food with family.

## 2023-12-03 ENCOUNTER — Encounter (HOSPITAL_COMMUNITY): Payer: Self-pay

## 2023-12-03 ENCOUNTER — Ambulatory Visit (INDEPENDENT_AMBULATORY_CARE_PROVIDER_SITE_OTHER): Payer: 59 | Admitting: *Deleted

## 2023-12-03 VITALS — BP 124/84 | HR 66 | Ht 62.0 in | Wt 233.0 lb

## 2023-12-03 DIAGNOSIS — F319 Bipolar disorder, unspecified: Secondary | ICD-10-CM

## 2023-12-03 NOTE — Progress Notes (Signed)
 Patient arrived for injection of Abilify  400mg . Sample given in Right Deltoid without issue or complaint. 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 Joking with this Clinical research associate and being sociable. Denies SI/HI or AV hallucinations. Will return in 28 days.

## 2023-12-31 ENCOUNTER — Ambulatory Visit (HOSPITAL_COMMUNITY): Payer: 59

## 2023-12-31 VITALS — BP 123/89 | HR 67 | Ht 63.0 in | Wt 237.0 lb

## 2023-12-31 DIAGNOSIS — F319 Bipolar disorder, unspecified: Secondary | ICD-10-CM

## 2023-12-31 NOTE — Progress Notes (Signed)
 Pt presents today for injection of Abilify Maintenna, Administered in left deltoid with no complaints.     Pt states that she is doing well and feels that medicine is a good fit for her overall. Pt denies any AV, SI, and HI. Pt presents to be well conversational as well.

## 2024-01-06 ENCOUNTER — Ambulatory Visit (INDEPENDENT_AMBULATORY_CARE_PROVIDER_SITE_OTHER): Payer: 59 | Admitting: Student

## 2024-01-06 DIAGNOSIS — F319 Bipolar disorder, unspecified: Secondary | ICD-10-CM | POA: Diagnosis not present

## 2024-01-06 DIAGNOSIS — F17200 Nicotine dependence, unspecified, uncomplicated: Secondary | ICD-10-CM | POA: Diagnosis not present

## 2024-01-06 DIAGNOSIS — Z0000 Encounter for general adult medical examination without abnormal findings: Secondary | ICD-10-CM

## 2024-01-06 DIAGNOSIS — Z79899 Other long term (current) drug therapy: Secondary | ICD-10-CM

## 2024-01-06 NOTE — Progress Notes (Signed)
 BH MD/PA/NP OP Progress Note   01/06/2024 4:14 PM Amy Sexton  MRN:  [id number]  Chief Complaint: MM follow-up  HPI: 38 year old female seen today for follow-up psychiatric evaluation. She has a psychiatric history of Bipolar disorder and tobacco dependence as well as a reported history of ADHD not currently managed with medications. She is currently managed on Abilify Maintena 400 mg monthly injection. She reports that her medication is adequately managing her symptoms. She voices no acute concerns.   Today she was fairly-groomed, pleasant, cooperative, engaged in conversation, maintained eye contact.   Her mood has been stable. She has been sleeping well. Her appetite is variable, only eating in the morning. 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 She has sufficient energy (she gets tired around 2-3 AM, but stays busy), motivation, and concentration to ensure her responsibilities are managed. She notes some work stressors, but not anything that she is unable to manage. She denies SI/HI/AVH, mania, and paranoia.    No medication changes made today.  Patient agreeable to continue medication prescribed.   Denies akathisia, tremors, GI upset, N/V/D/C, stiffness.   Cigarettes: almost 1 ppd.  Alcohol: Denies Illicit: Denies  Visit Diagnosis:    ICD-10-CM   1. Bipolar I disorder (HCC) [F31.9]  F31.9     2. Tobacco dependence  F17.200     3. Long term current use of antipsychotic medication  Z79.899     4. Encounter for health maintenance examination in adult  Z00.00          Past Psychiatric History: Bipolar disorder, ADHD, and tobacco dependence.  Past Medical History:  Past Medical History:  Diagnosis Date   ADHD (attention deficit hyperactivity disorder)    Asthma    Bipolar I disorder, most recent episode (or current) depressed, severe, specified as with psychotic behavior (HCC) 05/30/2020   No past surgical history on file.  Family Psychiatric History:  Denies  Family History:  Family History  Problem Relation Age of Onset   Hypertension Mother    Heart disease Maternal Grandmother    Hyperlipidemia Maternal Grandmother     Social History:  Social History   Socioeconomic History   Marital status: Single    Spouse name: Not on file   Number of children: Not on file   Years of education: Not on file   Highest education level: Not on file  Occupational History   Not on file  Tobacco Use   Smoking status: Every Day    Current packs/day: 0.50    Average packs/day: 0.5 packs/day for 10.0 years (5.0 ttl pk-yrs)    Types: Cigarettes   Smokeless tobacco: Never  Vaping Use   Vaping status: Never Used  Substance and Sexual Activity   Alcohol use: Not Currently   Drug use: No   Sexual activity: Yes    Birth control/protection: Condom  Other Topics Concern   Not on file  Social History Narrative   Not on file   Social Drivers of Health   Financial Resource Strain: Not on file  Food Insecurity: Not on file  Transportation Needs: Not on file  Physical Activity: Not on file  Stress: Not on file  Social Connections: Not on file    Allergies: No Known Allergies  Metabolic Disorder Labs: Lab Results  Component Value Date   HGBA1C 6.0 (H) 09/01/2023   No results found for: "PROLACTIN" Lab Results  Component Value Date   CHOL 144 09/01/2023   TRIG 64 09/01/2023   HDL  42 09/01/2023   CHOLHDL 3.4 09/01/2023   VLDL 16.1 01/03/2021   LDLCALC 89 09/01/2023   LDLCALC 97 01/03/2021   Lab Results  Component Value Date   TSH 1.410 09/01/2023   TSH 0.714 03/27/2017    Therapeutic Level Labs: No results found for: "LITHIUM" No results found for: "VALPROATE" No results found for: "CBMZ"  Current Medications: Current Outpatient Medications  Medication Sig Dispense Refill   ARIPiprazole ER (ABILIFY MAINTENA) 400 MG PRSY prefilled syringe Inject 400 mg into the muscle every 28 (twenty-eight) days. 1 each 11   ibuprofen  (ADVIL,MOTRIN) 200 MG tablet Take 200 mg by mouth every 6 (six) hours as needed.     Current Facility-Administered Medications  Medication Dose Route Frequency Provider Last Rate Last Admin   ARIPiprazole ER (ABILIFY MAINTENA) 400 MG prefilled syringe 400 mg  400 mg Intramuscular Q28 days Toy Cookey E, NP   400 mg at 12/31/23 1523     Musculoskeletal: Strength & Muscle Tone: within normal limits Gait & Station: normal Patient leans: N/A  Psychiatric Specialty Exam: Review of Systems  Constitutional:  Negative for activity change, appetite change and unexpected weight change.  Gastrointestinal: Negative.   Genitourinary: Negative.   Neurological:  Negative for dizziness, seizures and headaches.    There were no vitals taken for this visit.There is no height or weight on file to calculate BMI. See LAI nursing encounter from 3/6.  General Appearance: Casual and Fairly Groomed  Eye Contact:  Good  Speech:  Clear and Coherent and Normal Rate  Volume:  Normal  Mood:  Euthymic,   Affect:  Appropriate and Congruent  Thought Process:  Coherent, Goal Directed and Linear  Orientation:  Full (Time, Place, and Person)  Thought Content: WDL and Logical   Suicidal Thoughts:  No  Homicidal Thoughts:  No  Memory:  Immediate;   Good Recent;   Good Remote;   Good  Judgement:  Good  Insight:  Good  Psychomotor Activity:  Normal  Concentration:  Concentration: Good and Attention Span: Good  Recall:  Good  Fund of Knowledge: Fair  Language: Good  Akathisia:  No  Handed:  Right  AIMS (if indicated):Done; AIMS= 0  Assets:  Communication Skills Desire for Improvement Financial Resources/Insurance Housing Social Support  ADL's:  Intact  Cognition: WNL  Sleep:  Good   Screenings: AIMS    Flowsheet Row [facility] in [facility]  AIMS Total Score 0      GAD-7    Flowsheet Row Office Visit from 04/16/2023 in [facility] Office Visit from 01/22/2023 in [facility] Video Visit from 08/04/2022 in [facility] Video Visit from 04/30/2022 in [facility] Video Visit from 11/06/2021 in [facility]  Total GAD-7 Score 0 0 9 7 12       PHQ2-9    Flowsheet Row Office Visit from 04/16/2023 in [facility] Office Visit from 01/22/2023 in [facility] Video Visit from 08/04/2022 in [facility] Video Visit from 04/30/2022 in [facility] Video Visit from 11/06/2021 in [facility]  PHQ-2 Total Score 1 0 2 3 3   PHQ-9 Total Score 9 -- 12 12 12       Flowsheet Row Office Visit from 04/16/2023 in [facility] Office Visit from 01/22/2023 in [facility] Video Visit from 11/06/2021 in [facility]  C-SSRS RISK CATEGORY No Risk No Risk No Risk        Assessment and Plan: Patient reports that she is doing well on her current medication regimen.  She denies acute concerns or complaints. No medication changes to be made today; patient agreeable to continue LAI as prescribed.   As patient with prediabetic A1c, referral to [facility] family practice for primary care, where she was previously seen, was placed after last visit. They did reach out to her, and she plans to get into contact to arrange. Labs otherwise WNL.  Although she has been without symptoms of mania/hypomania, it appears, for the past couple of years, will continue medication regimen without adjustment to avoid decompensation.  1. Bipolar I disorder (HCC)  - Continue- ARIPiprazole ER (ABILIFY MAINTENA) 400 MG PRSY prefilled syringe; Inject 400 mg into the muscle every 28 (twenty-eight) days.   2. Tobacco Dependence -  Counseled on cessation. Patient still in pre-contemplative stage.      Follow up in 3 months for MM Follow up in 28 days for LAI    Lamar Sprinkles, MD 01/06/2024, 4:14 PM

## 2024-01-28 ENCOUNTER — Ambulatory Visit (INDEPENDENT_AMBULATORY_CARE_PROVIDER_SITE_OTHER)

## 2024-01-28 ENCOUNTER — Encounter (HOSPITAL_COMMUNITY): Payer: Self-pay

## 2024-01-28 VITALS — BP 127/85 | HR 94 | Ht 63.0 in | Wt 234.0 lb

## 2024-01-28 DIAGNOSIS — F411 Generalized anxiety disorder: Secondary | ICD-10-CM | POA: Diagnosis not present

## 2024-01-28 NOTE — Progress Notes (Cosign Needed)
 Pt presents today for injection of Abilify Maintenna, Administered in right deltoid with no complaints.        Pt states that she is doing well and feels that medicine is a good fit for her overall. Pt denies any AV, SI, and HI. Pt presents to be well conversational as well.   JNL, CMA

## 2024-02-25 ENCOUNTER — Encounter (HOSPITAL_COMMUNITY): Payer: Self-pay

## 2024-02-25 ENCOUNTER — Ambulatory Visit (HOSPITAL_COMMUNITY)

## 2024-02-25 VITALS — BP 135/89 | HR 77 | Ht 63.0 in | Wt 238.0 lb

## 2024-02-25 DIAGNOSIS — F411 Generalized anxiety disorder: Secondary | ICD-10-CM

## 2024-02-25 DIAGNOSIS — F200 Paranoid schizophrenia: Secondary | ICD-10-CM

## 2024-02-25 DIAGNOSIS — G4700 Insomnia, unspecified: Secondary | ICD-10-CM

## 2024-02-25 NOTE — Progress Notes (Signed)
 n as scheduled for her monthly injection of Abilify  M 400 mg given today in her R Deltoid without difficulty. She continues to work, grandma lives with her and there are no changes since her last visit. States she is doing well. She is to return in one month for her next injection. No complaints voiced

## 2024-03-24 ENCOUNTER — Encounter (HOSPITAL_COMMUNITY): Payer: Self-pay

## 2024-03-24 ENCOUNTER — Ambulatory Visit (INDEPENDENT_AMBULATORY_CARE_PROVIDER_SITE_OTHER)

## 2024-03-24 VITALS — BP 136/91 | HR 86 | Wt 237.0 lb

## 2024-03-24 DIAGNOSIS — F411 Generalized anxiety disorder: Secondary | ICD-10-CM | POA: Diagnosis not present

## 2024-03-24 DIAGNOSIS — G4700 Insomnia, unspecified: Secondary | ICD-10-CM

## 2024-03-24 DIAGNOSIS — F200 Paranoid schizophrenia: Secondary | ICD-10-CM

## 2024-03-24 NOTE — Progress Notes (Signed)
 n as scheduled for her monthly injection of Abilify  M 400 mg given today in her R Deltoid without difficulty. She continues to work, grandma lives with her and there are no changes since her last visit. States she is doing well. She is to return in one month for her next injection. No complaints voiced

## 2024-03-24 NOTE — Progress Notes (Deleted)
 l

## 2024-04-07 ENCOUNTER — Encounter (HOSPITAL_COMMUNITY): Payer: Self-pay | Admitting: Student

## 2024-04-07 ENCOUNTER — Ambulatory Visit (HOSPITAL_COMMUNITY): Admitting: Student

## 2024-04-07 VITALS — BP 150/91 | HR 75

## 2024-04-07 DIAGNOSIS — Z0000 Encounter for general adult medical examination without abnormal findings: Secondary | ICD-10-CM | POA: Diagnosis not present

## 2024-04-07 DIAGNOSIS — Z79899 Other long term (current) drug therapy: Secondary | ICD-10-CM

## 2024-04-07 DIAGNOSIS — R7303 Prediabetes: Secondary | ICD-10-CM

## 2024-04-07 DIAGNOSIS — F17200 Nicotine dependence, unspecified, uncomplicated: Secondary | ICD-10-CM

## 2024-04-07 DIAGNOSIS — F319 Bipolar disorder, unspecified: Secondary | ICD-10-CM | POA: Diagnosis not present

## 2024-04-07 MED ORDER — ARIPIPRAZOLE ER 400 MG IM PRSY: 400.0000 mg | PREFILLED_SYRINGE | INTRAMUSCULAR | 11 refills | Status: AC

## 2024-04-07 MED ORDER — ARIPIPRAZOLE ER 400 MG IM PRSY
400.0000 mg | PREFILLED_SYRINGE | INTRAMUSCULAR | Status: AC
  Administered 2024-04-21 – 2024-11-23 (×8): 400 mg via INTRAMUSCULAR

## 2024-04-07 NOTE — Progress Notes (Signed)
 BH MD/PA/NP OP Progress Note   04/07/2024 3:09 PM Amy Sexton  MRN:  [id number]  Chief Complaint: MM follow-up  HPI: Amy Sexton is a 38 y.o.  female seen today for follow-up psychiatric evaluation. She has a psychiatric history of Bipolar disorder and tobacco dependence as well as a reported history of ADHD not currently managed with medications. She is currently managed on Abilify  Maintena 400 mg monthly injection. She reports that her medication is adequately managing her symptoms. Patient denies resurgence of sx as she nears her next injection. She voices no acute concerns or complaints today.   Today she was fairly-groomed, pleasant, cooperative, engaged in conversation, maintained eye contact.   Her mood has been stable. She has been sleeping well overall. Her appetite is variable, only eating in the morning. Reports appetite is some less in the summer. 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 She has sufficient energy (she gets tired around 2-3 AM, but stays busy), motivation, and concentration to ensure her responsibilities are managed. She denies SI/HI/AVH, mania, and paranoia.    No medication changes made today.  Patient agreeable to continue medication prescribed.   Denies akathisia, tremors, GI upset, N/V/D/C, stiffness.   Cigarettes: almost 1 ppd.  Alcohol: Denies Illicit: Denies  Visit Diagnosis:    ICD-10-CM   1. Bipolar I disorder (HCC)  F31.9 ARIPiprazole  ER (ABILIFY  MAINTENA) 400 MG PRSY prefilled syringe    2. Tobacco dependence  F17.200     3. Long term current use of antipsychotic medication  Z79.899     4. Encounter for health maintenance examination in adult  Z00.00           Past Psychiatric History: Bipolar disorder, ADHD, and tobacco dependence.  Past Medical History:  Past Medical History:  Diagnosis Date   ADHD (attention deficit hyperactivity disorder)    Asthma    Bipolar I disorder, most recent episode (or current) depressed, severe,  specified as with psychotic behavior (HCC) 05/30/2020   No past surgical history on file.  Family Psychiatric History: Denies  Family History:  Family History  Problem Relation Age of Onset   Hypertension Mother    Heart disease Maternal Grandmother    Hyperlipidemia Maternal Grandmother     Social History:  Social History   Socioeconomic History   Marital status: Single    Spouse name: Not on file   Number of children: Not on file   Years of education: Not on file   Highest education level: Not on file  Occupational History   Not on file  Tobacco Use   Smoking status: Every Day    Current packs/day: 0.50    Average packs/day: 0.5 packs/day for 10.0 years (5.0 ttl pk-yrs)    Types: Cigarettes   Smokeless tobacco: Never  Vaping Use   Vaping status: Never Used  Substance and Sexual Activity   Alcohol use: Not Currently   Drug use: No   Sexual activity: Yes    Birth control/protection: Condom  Other Topics Concern   Not on file  Social History Narrative   Not on file   Social Drivers of Health   Financial Resource Strain: Not on file  Food Insecurity: Not on file  Transportation Needs: Not on file  Physical Activity: Not on file  Stress: Not on file  Social Connections: Not on file    Allergies: No Known Allergies  Metabolic Disorder Labs: Lab Results  Component Value Date   HGBA1C 6.0 (H) 09/01/2023   No  results found for: PROLACTIN Lab Results  Component Value Date   CHOL 144 09/01/2023   TRIG 64 09/01/2023   HDL 42 09/01/2023   CHOLHDL 3.4 09/01/2023   VLDL 16.1 01/03/2021   LDLCALC 89 09/01/2023   LDLCALC 97 01/03/2021   Lab Results  Component Value Date   TSH 1.410 09/01/2023   TSH 0.714 03/27/2017    Therapeutic Level Labs: No results found for: LITHIUM No results found for: VALPROATE No results found for: CBMZ  Current Medications: Current Outpatient Medications  Medication Sig Dispense Refill   [START ON 04/21/2024]  ARIPiprazole  ER (ABILIFY  MAINTENA) 400 MG PRSY prefilled syringe Inject 400 mg into the muscle every 28 (twenty-eight) days. 1 each 11   ibuprofen  (ADVIL ,MOTRIN ) 200 MG tablet Take 200 mg by mouth every 6 (six) hours as needed.     Current Facility-Administered Medications  Medication Dose Route Frequency Provider Last Rate Last Admin   [START ON 04/21/2024] ARIPiprazole  ER (ABILIFY  MAINTENA) 400 MG prefilled syringe 400 mg  400 mg Intramuscular Q28 days          Musculoskeletal: Strength & Muscle Tone: within normal limits Gait & Station: normal Patient leans: N/A  Psychiatric Specialty Exam: Review of Systems  Constitutional:  Negative for activity change, appetite change and unexpected weight change.  Gastrointestinal: Negative.   Genitourinary: Negative.   Neurological:  Negative for dizziness, seizures and headaches.    Blood pressure (!) 150/91, pulse 75, SpO2 97%.There is no height or weight on file to calculate BMI.   General Appearance: Casual and Fairly Groomed  Eye Contact:  Good  Speech:  Clear and Coherent and Normal Rate  Volume:  Normal  Mood:  Euthymic,   Affect:  Appropriate and Congruent  Thought Process:  Coherent, Goal Directed and Linear  Orientation:  Full (Time, Place, and Person)  Thought Content: WDL and Logical   Suicidal Thoughts:  No  Homicidal Thoughts:  No  Memory:  Immediate;   Good Recent;   Good Remote;   Good  Judgement:  Good  Insight:  Good  Psychomotor Activity:  Normal  Concentration:  Concentration: Good and Attention Span: Good  Recall:  Good  Fund of Knowledge: Fair  Language: Good  Akathisia:  No  Handed:  Right  AIMS (if indicated):Done; AIMS= 0  Assets:  Communication Skills Desire for Improvement Financial Resources/Insurance Housing Social Support  ADL's:  Intact  Cognition: WNL  Sleep:  Fair   Screenings: Geneticist, molecular Office Visit from 01/22/2023 in [facility]  AIMS Total  Score 0   GAD-7    Flowsheet Row Office Visit from 04/16/2023 in [facility] Office Visit from 01/22/2023 in [facility] Video Visit from 08/04/2022 in [facility] Video Visit from 04/30/2022 in [facility] Video Visit from 11/06/2021 in [facility]  Total GAD-7 Score 0 0 9 7 12    PHQ2-9    Flowsheet Row Office Visit from 04/16/2023 in [facility] Office Visit from 01/22/2023 in [facility] Video Visit from 08/04/2022 in [facility] Video Visit from 04/30/2022 in [facility] Video Visit from 11/06/2021 in [facility]  PHQ-2 Total Score 1 0 2 3 3   PHQ-9 Total Score 9 -- 12 12 12    Flowsheet Row Office Visit from 04/16/2023 in [facility] Office Visit from 01/22/2023 in [facility] Video Visit from 11/06/2021 in [facility]  C-SSRS RISK CATEGORY No Risk No Risk No Risk     Assessment and Plan: Patient reports that she is doing well on her current medication regimen.  She denies acute concerns or complaints. No medication changes to be made today; patient agreeable to continue LAI as prescribed.   As patient with prediabetic A1c and elevated BP, referral again to [facility] family practice for primary care, where she was previously seen. Although she has been without symptoms of mania/hypomania, it appears, for the past couple of years, will continue medication regimen without adjustment to avoid decompensation. Patient poses no safety concerns toward herself or others at this time.   1. Bipolar I disorder (HCC)  - Continue- ARIPiprazole  ER (ABILIFY  MAINTENA) 400 MG PRSY prefilled syringe; Inject 400 mg into the muscle every 28 (twenty-eight)  days.   2. Tobacco Dependence - Counseled on cessation. Patient still in pre-contemplative stage.  3. Prediabetes, Elevated BP - Referral to primary care placed.     Follow up in 3 months for MM with Dr. Sahil Kapoor, as this writer will be leaving the practice in late June.  Follow up in 28 days for LAI    Shery Done, MD 04/07/2024, 3:09 PM

## 2024-04-21 ENCOUNTER — Ambulatory Visit (INDEPENDENT_AMBULATORY_CARE_PROVIDER_SITE_OTHER)

## 2024-04-21 VITALS — BP 114/91 | HR 78 | Ht 63.0 in | Wt 238.2 lb

## 2024-04-21 DIAGNOSIS — F319 Bipolar disorder, unspecified: Secondary | ICD-10-CM

## 2024-04-21 NOTE — Progress Notes (Signed)
 Pt presents today for injection of Abilify  Maintenna, Administered in left deltoid with no complaints.        Pt states that she is doing well and feels that medicine is a good fit for her overall. Pt denies any AV, SI, and HI. Pt presents to be well conversational as well.    JNL, CMA

## 2024-05-02 ENCOUNTER — Telehealth (HOSPITAL_COMMUNITY): Payer: Self-pay

## 2024-05-02 NOTE — Telephone Encounter (Signed)
 Did not mean to create this encounter.    JNL, CMA

## 2024-05-19 ENCOUNTER — Ambulatory Visit (HOSPITAL_COMMUNITY)

## 2024-05-25 ENCOUNTER — Ambulatory Visit (INDEPENDENT_AMBULATORY_CARE_PROVIDER_SITE_OTHER)

## 2024-05-25 VITALS — BP 139/70 | HR 80 | Wt 238.0 lb

## 2024-05-25 DIAGNOSIS — F319 Bipolar disorder, unspecified: Secondary | ICD-10-CM

## 2024-05-25 NOTE — Progress Notes (Cosign Needed)
 Pt presents today for injection of Abilify  Maintenna 400MG , Administered in RIGHT deltoid with no complaints.        Pt states that she is doing well and feels that medicine is a good fit for her overall. Pt denies any AV, SI, and HI. Pt presents to be well conversational as well.    JNL, CMA

## 2024-06-22 ENCOUNTER — Encounter (HOSPITAL_COMMUNITY): Payer: Self-pay

## 2024-06-22 ENCOUNTER — Ambulatory Visit (INDEPENDENT_AMBULATORY_CARE_PROVIDER_SITE_OTHER)

## 2024-06-22 VITALS — BP 118/97 | HR 99 | Wt 241.4 lb

## 2024-06-22 DIAGNOSIS — G4700 Insomnia, unspecified: Secondary | ICD-10-CM | POA: Diagnosis not present

## 2024-06-22 DIAGNOSIS — F200 Paranoid schizophrenia: Secondary | ICD-10-CM

## 2024-06-22 DIAGNOSIS — F411 Generalized anxiety disorder: Secondary | ICD-10-CM

## 2024-06-22 NOTE — Progress Notes (Signed)
 n as scheduled for her monthly injection of Abilify  M 400 mg given today in her R Deltoid without difficulty. She continues to work, grandma lives with her and there are no changes since her last visit. States she is doing well. She is to return in one month for her next injection. No complaints voiced

## 2024-07-07 NOTE — Progress Notes (Signed)
 BH MD/PA/NP OP Progress Note  07/21/2024 2:20 PM Amy Sexton  MRN:  [id number]  Visit Diagnosis:    ICD-10-CM   1. Bipolar I disorder (HCC)  F31.9     2. Generalized anxiety disorder  F41.1     3. Long term current use of antipsychotic medication  Z79.899       Assessment:  Amy Sexton presents for follow-up evaluation. Today, 07/21/24, patient reports feeling stable on the Abilify  Maintena shot.  She has been compliant with the medication and has had no recent admissions.  Her mood, depression and anxiety have been fairly stable on the current dose.  She works overnight which is affecting her sleep but she has been able to sleep over the weekends that she does not work on.  She has chronic poor appetite, encouraged eating her more.  She reported good energy, denied any physical concerns including GI upset, stiffness.  She continues to smoke cigarettes, 1-1/2 pack each day, encouraged her cessation, patient is currently precontemplative.  She has not been using any other substances.  There are no safety concerns, though patient has a gun at home, it is unloaded and placed in a secure place.  She lives with her grandmother.  She is not actively or passively homicidal or suicidal.  She has an upcoming school reunion after 20 years and is thinking about attending that.  Since she has been fairly stable on the monthly injectable, plan to continue the same.  We will follow-up with her in 3 months.  Risk Assessment: An assessment of suicide and violence risk factors was performed as part of this evaluation and is not significantly changed from the last visit. While future psychiatric events cannot be accurately predicted, the patient does not currently require acute inpatient psychiatric care and does not  currently meet [facility]  involuntary commitment criteria. Patient was given contact information for crisis resources, behavioral health clinic and was instructed to call 911 for  emergencies.   Plan: # Bipolar 1 disorder Past medication trials: Abilify  Status of problem: Current Interventions: -- Continue Abilify  Maintena- Last shot on 07/21/2024  # Tobacco dependence Past medication trials:  Status of problem: Active Interventions: -- Continue counseling on cessation, patient is still precontemplative.  # Prediabetes with elevated blood pressure Past medication trials:  Status of problem: Current Interventions: -- Patient follows up with a primary care provider  Chief Complaint:  Chief Complaint  Patient presents with   Follow-up   Medication Refill   Bipolar 1 disorder   HPI:  Amy Sexton is a 38 y.o. female patient that presented for a follow-up appointment.  She is being followed up in the clinic for bipolar disorder and tobacco dependence and has a reported history of ADHD in the past.  She is currently being managed with Abilify  Maintena 400 mg monthly. She presented unaccompanied to the clinic and reported her mood as all right .  She denied any active or passive SI/HI/AVH.  Stated that she has been on Abilify  shots because seeing things, hearing things, anxiety and depression .  Stated that on the current dose her anxiety has been all right and depression has been better .  When asked about sleep she stated that she works overnight at [employer] and sleeps during the day, about 4 to 5 hours and feels refreshed after that.  She also reported that she makes up for sleep over the weekend.  She reported poor appetite stating I only eat 1-2 times a day and  also reported that I have been like this since younger .  She denied any physical concerns including stiffness, akathisia, tremors, GI upset. She has an injection appointment scheduled today and the next one will be 28 days from now.   She denied any symptoms of mania, paranoia.  She denied using any other substances, occasionally drinks and smokes about 1-1/2 packs of cigarettes every day.   Encourage cessation, patient is currently precontemplative.   We discussed about continuing her monthly Abilify  Maintena, reemphasized on the risks and side effects of the medication.  Will continue to follow-up in 10 to 12 weeks.   Past Psychiatric History: Bipolar disorder, ADHD, and tobacco dependence   Past Medical History:  Past Medical History:  Diagnosis Date   ADHD (attention deficit hyperactivity disorder)    Asthma    Bipolar I disorder, most recent episode (or current) depressed, severe, specified as with psychotic behavior (HCC) 05/30/2020   No past surgical history on file.  Family Psychiatric History: Nothing significant  Family History:  Family History  Problem Relation Age of Onset   Hypertension Mother    Heart disease Maternal Grandmother    Hyperlipidemia Maternal Grandmother     Social History:  Social History   Socioeconomic History   Marital status: Single    Spouse name: Not on file   Number of children: Not on file   Years of education: Not on file   Highest education level: Not on file  Occupational History   Not on file  Tobacco Use   Smoking status: Every Day    Current packs/day: 0.50    Average packs/day: 0.5 packs/day for 10.0 years (5.0 ttl pk-yrs)    Types: Cigarettes   Smokeless tobacco: Never  Vaping Use   Vaping status: Never Used  Substance and Sexual Activity   Alcohol use: Not Currently   Drug use: No   Sexual activity: Yes    Birth control/protection: Condom  Other Topics Concern   Not on file  Social History Narrative   Not on file   Social Drivers of Health   Financial Resource Strain: Not on file  Food Insecurity: Not on file  Transportation Needs: Not on file  Physical Activity: Not on file  Stress: Not on file  Social Connections: Not on file    Allergies: No Known Allergies  Current Medications: Current Outpatient Medications  Medication Sig Dispense Refill   ARIPiprazole  ER (ABILIFY  MAINTENA) 400 MG  PRSY prefilled syringe Inject 400 mg into the muscle every 28 (twenty-eight) days. 1 each 11   ibuprofen  (ADVIL ,MOTRIN ) 200 MG tablet Take 200 mg by mouth every 6 (six) hours as needed.     Current Facility-Administered Medications  Medication Dose Route Frequency Provider Last Rate Last Admin   ARIPiprazole  ER (ABILIFY  MAINTENA) 400 MG prefilled syringe 400 mg  400 mg Intramuscular Q28 days    400 mg at 06/22/24 1629     Musculoskeletal: Strength & Muscle Tone: within normal limits Gait & Station: normal Patient leans: N/A  Psychiatric Specialty Exam: Blood pressure 129/68, pulse 85, height 5' 3 (1.6 m), weight 246 lb (111.6 kg).Body mass index is 43.58 kg/m. Review of Systems  General Appearance: Casual and Fairly Groomed  Eye Contact:  Good  Speech:  Clear and Coherent  Volume:  Normal  Mood:  Euthymic  Affect:  Appropriate  Thought Content: Logical   Suicidal Thoughts:  No  Homicidal Thoughts:  No  Thought Process:  Coherent  Orientation:  Full (Time,  Place, and Person)    Memory: Immediate;   Good Recent;   Good Remote;   Good  Judgment:  Fair  Insight:  Fair  Concentration:  Concentration: Good and Attention Span: Good  Recall:  not formally assessed   Fund of Knowledge: Good  Language: Good  Psychomotor Activity:  Normal  Akathisia:  No  AIMS (if indicated): not done  Assets:  Communication Skills Desire for Improvement Financial Resources/Insurance Housing Intimacy Leisure Time Resilience Transportation  ADL's:  Intact  Cognition: WNL  Sleep:  Good   Metabolic Disorder Labs: Lab Results  Component Value Date   HGBA1C 6.0 (H) 09/01/2023   No results found for: PROLACTIN Lab Results  Component Value Date   CHOL 144 09/01/2023   TRIG 64 09/01/2023   HDL 42 09/01/2023   CHOLHDL 3.4 09/01/2023   VLDL 89.1 01/03/2021   LDLCALC 89 09/01/2023   LDLCALC 97 01/03/2021   Lab Results  Component Value Date   TSH 1.410 09/01/2023   TSH 0.714  03/27/2017    Therapeutic Level Labs: No results found for: LITHIUM No results found for: VALPROATE No results found for: CBMZ   Screenings: AIMS    Flowsheet Row Office Visit from 01/22/2023 in [facility]  AIMS Total Score 0   GAD-7    Flowsheet Row Office Visit from 04/16/2023 in [facility] Office Visit from 01/22/2023 in [facility] Video Visit from 08/04/2022 in [facility] Video Visit from 04/30/2022 in [facility] Video Visit from 11/06/2021 in [facility]  Total GAD-7 Score 0 0 9 7 12    PHQ2-9    Flowsheet Row Clinical Support from 07/21/2024 in [facility] Office Visit from 04/16/2023 in [facility] Office Visit from 01/22/2023 in [facility] Video Visit from 08/04/2022 in [facility] Video Visit from 04/30/2022 in [facility]  PHQ-2 Total Score 0 1 0 2 3  PHQ-9 Total Score -- 9 -- 12 12   Flowsheet Row Office Visit from 04/16/2023 in [facility] Office Visit from 01/22/2023 in [facility] Video Visit from 11/06/2021 in [facility]  C-SSRS RISK CATEGORY No Risk No Risk No Risk    Collaboration of Care: Collaboration of Care: Other Dr. Susen  Patient/Guardian was advised Release of Information must be obtained prior to any record release in order to collaborate their care with an outside provider. Patient/Guardian was advised if they have not already done so to contact the registration department to sign all necessary forms in order for us  to release information regarding their care.   Consent: Patient/Guardian gives verbal consent for treatment and assignment of benefits for  services provided during this visit. Patient/Guardian expressed understanding and agreed to proceed.    Cassiel Fernandez, MD 07/21/2024, 2:20 PM

## 2024-07-21 ENCOUNTER — Ambulatory Visit (INDEPENDENT_AMBULATORY_CARE_PROVIDER_SITE_OTHER)

## 2024-07-21 ENCOUNTER — Encounter (HOSPITAL_COMMUNITY): Payer: Self-pay

## 2024-07-21 VITALS — BP 129/68 | HR 85 | Ht 63.0 in | Wt 246.0 lb

## 2024-07-21 VITALS — BP 107/78 | HR 97 | Temp 98.8°F | Ht 63.0 in | Wt 245.2 lb

## 2024-07-21 DIAGNOSIS — F411 Generalized anxiety disorder: Secondary | ICD-10-CM | POA: Diagnosis not present

## 2024-07-21 DIAGNOSIS — F319 Bipolar disorder, unspecified: Secondary | ICD-10-CM

## 2024-07-21 DIAGNOSIS — Z79899 Other long term (current) drug therapy: Secondary | ICD-10-CM

## 2024-07-21 NOTE — Progress Notes (Cosign Needed)
 Patient presented to office for injection of Abilify  Maintena 400 mg. Patient received injection in Left Deltoid. Patient tolerated injection well. Patient denies SI/HI/AVH. Patient denies any concerns. Patient will return in 28 days.   Patient was encouraged to update her insurance information with pharmacy to determine if her medications are covered.

## 2024-08-18 ENCOUNTER — Ambulatory Visit (HOSPITAL_COMMUNITY)

## 2024-08-18 ENCOUNTER — Encounter (HOSPITAL_COMMUNITY): Payer: Self-pay

## 2024-08-18 VITALS — BP 128/88 | HR 87 | Wt 247.8 lb

## 2024-08-18 DIAGNOSIS — F200 Paranoid schizophrenia: Secondary | ICD-10-CM

## 2024-08-18 NOTE — Progress Notes (Signed)
 In as scheduled for her monthly injection of Abilify  M 400 mg given today in her R Deltoid without difficulty. She continues to work, grandma lives with her and there are no changes since her last visit. States she is doing well. She is to return in one month for her next injection.

## 2024-09-15 ENCOUNTER — Encounter (HOSPITAL_COMMUNITY): Payer: Self-pay

## 2024-09-15 ENCOUNTER — Ambulatory Visit (INDEPENDENT_AMBULATORY_CARE_PROVIDER_SITE_OTHER)

## 2024-09-15 ENCOUNTER — Ambulatory Visit (HOSPITAL_COMMUNITY)

## 2024-09-15 VITALS — BP 133/98 | HR 85 | Wt 247.0 lb

## 2024-09-15 DIAGNOSIS — F411 Generalized anxiety disorder: Secondary | ICD-10-CM

## 2024-09-15 DIAGNOSIS — F200 Paranoid schizophrenia: Secondary | ICD-10-CM

## 2024-09-15 NOTE — Progress Notes (Signed)
 Patient presented to office for injection of Abilify  Maintena 400 mg. Patient received injection in Left Deltoid. Patient tolerated injection well. Patient denies SI/HI/AVH. Patient denies any concerns. Patient will return in 28 days.

## 2024-09-29 NOTE — Progress Notes (Signed)
 BH MD/PA/NP OP Progress Note  10/13/2024 3:01 PM Zerah Hilyer  MRN:  [id number]  Visit Diagnosis:    ICD-10-CM   1. Bipolar I disorder (HCC)  F31.9     2. Tobacco use disorder  F17.200        Assessment:  Aikam Hellickson presents for follow-up evaluation. Today, 10/13/2024, patient reports feeling stable on the Abilify  Maintena shot.  She has remained fairly stable on the current medication regimen, has had no worsening of depression, anxiety.  Her mood has been fairly stable, she has been sleeping well though her appetite has remained poor with no recent changes.  She has not had any side effects or any new physical concerns.  She continues to smoke cigarettes every day and remains precontemplative to quit smoking.  She is not using any other substances.  There are no safety concerns, she is not actively or passively homicidal or suicidal.  She lives with her grandmother, has guns at home which are secured with no access.  Her school reunion went great and she has an upcoming holiday party today. Plan to continue the same medication management, will follow-up with her in 12 weeks.  Risk Assessment: An assessment of suicide and violence risk factors was performed as part of this evaluation and is not significantly changed from the last visit. While future psychiatric events cannot be accurately predicted, the patient does not currently require acute inpatient psychiatric care and does not  currently meet [place]  involuntary commitment criteria. Patient was given contact information for crisis resources, behavioral health clinic and was instructed to call 911 for emergencies.   Plan: # Bipolar 1 disorder Past medication trials: Abilify  Status of problem: Current Interventions: -- Continue Abilify  Maintena- Last shot on 09/15/2024  # Tobacco dependence Past medication trials:  Status of problem: Active Interventions: -- Continue counseling on cessation, patient is still  precontemplative.  # Prediabetes with elevated blood pressure Past medication trials:  Status of problem: Current Interventions: -- Patient follows up with a primary care provider  Chief Complaint:  Chief Complaint  Patient presents with   Follow-up   Medication Refill   HPI:  Harnoor Reta is a 38 y.o. female patient that presented for a follow-up appointment.  She is being followed up in the clinic for bipolar disorder and tobacco dependence and has a reported history of ADHD in the past.  She is currently being managed with Abilify  Maintena 400 mg monthly.  She presented to the clinic unaccompanied and reported her mood as fine.  She denied any active or passive SI/HI/AVH.  Reported that medications have been helping her  seeing things, hearing things, anxiety and depression .  She denied any side effects or any new physical concerns.  Reported her anxiety and depression as better .  She reported good sleep and poor appetite with no changes recently.  She denied any stiffness, akathisia, tremors or changes in the bowel or bladder function.  Her next injection is scheduled for December 31st. She denied any symptoms of mania, paranoia, denied using any substances, continues to smoke 1 to half a pack of cigarettes every day.  She is precontemplative to quit working.  We discussed about continuing Abilify  Maintena, we will continue to follow-up with her in 12 weeks.  Past Psychiatric History: Bipolar disorder, ADHD, and tobacco dependence   Past Medical History:  Past Medical History:  Diagnosis Date   ADHD (attention deficit hyperactivity disorder)    Asthma    Bipolar I disorder,  most recent episode (or current) depressed, severe, specified as with psychotic behavior (HCC) 05/30/2020   No past surgical history on file.  Family Psychiatric History: Nothing significant  Family History:  Family History  Problem Relation Age of Onset   Hypertension Mother    Heart disease  Maternal Grandmother    Hyperlipidemia Maternal Grandmother     Social History:  Social History   Socioeconomic History   Marital status: Single    Spouse name: Not on file   Number of children: Not on file   Years of education: Not on file   Highest education level: Not on file  Occupational History   Not on file  Tobacco Use   Smoking status: Every Day    Current packs/day: 0.50    Average packs/day: 0.5 packs/day for 10.0 years (5.0 ttl pk-yrs)    Types: Cigarettes   Smokeless tobacco: Never  Vaping Use   Vaping status: Never Used  Substance and Sexual Activity   Alcohol use: Not Currently   Drug use: No   Sexual activity: Yes    Birth control/protection: Condom  Other Topics Concern   Not on file  Social History Narrative   Not on file   Social Drivers of Health   Tobacco Use: High Risk (09/15/2024)   Patient History    Smoking Tobacco Use: Every Day    Smokeless Tobacco Use: Never    Passive Exposure: Not on file  Financial Resource Strain: Not on file  Food Insecurity: Not on file  Transportation Needs: Not on file  Physical Activity: Not on file  Stress: Not on file  Social Connections: Not on file  Depression (PHQ2-9): Low Risk (07/21/2024)   Depression (PHQ2-9)    PHQ-2 Score: 0  Alcohol Screen: Not on file  Housing: Not on file  Utilities: Not on file  Health Literacy: Not on file    Allergies: No Known Allergies  Current Medications: Current Outpatient Medications  Medication Sig Dispense Refill   ARIPiprazole  ER (ABILIFY  MAINTENA) 400 MG PRSY prefilled syringe Inject 400 mg into the muscle every 28 (twenty-eight) days. 1 each 11   ibuprofen  (ADVIL ,MOTRIN ) 200 MG tablet Take 200 mg by mouth every 6 (six) hours as needed.     Current Facility-Administered Medications  Medication Dose Route Frequency Provider Last Rate Last Admin   ARIPiprazole  ER (ABILIFY  MAINTENA) 400 MG prefilled syringe 400 mg  400 mg Intramuscular Q28 days    400 mg at  09/15/24 1620     Musculoskeletal: Strength & Muscle Tone: within normal limits Gait & Station: normal Patient leans: N/A  Psychiatric Specialty Exam: Blood pressure (!) 137/91, pulse (!) 105, height 5' 3 (1.6 m), weight 247 lb (112 kg).Body mass index is 43.75 kg/m. Review of Systems  General Appearance: Casual and Fairly Groomed  Eye Contact:  Good  Speech:  Clear and Coherent  Volume:  Normal  Mood:  Euthymic  Affect:  Appropriate  Thought Content: Logical   Suicidal Thoughts:  No  Homicidal Thoughts:  No  Thought Process:  Coherent  Orientation:  Full (Time, Place, and Person)    Memory: Immediate;   Good Recent;   Good Remote;   Good  Judgment:  Fair  Insight:  Fair  Concentration:  Concentration: Good and Attention Span: Good  Recall:  not formally assessed   Fund of Knowledge: Good  Language: Good  Psychomotor Activity:  Normal  Akathisia:  No  AIMS (if indicated): not done  Assets:  Communication Skills  Desire for Improvement Financial Resources/Insurance Housing Intimacy Leisure Time Resilience Transportation  ADL's:  Intact  Cognition: WNL  Sleep:  Good   Metabolic Disorder Labs: Lab Results  Component Value Date   HGBA1C 6.0 (H) 09/01/2023   No results found for: PROLACTIN Lab Results  Component Value Date   CHOL 144 09/01/2023   TRIG 64 09/01/2023   HDL 42 09/01/2023   CHOLHDL 3.4 09/01/2023   VLDL 89.1 01/03/2021   LDLCALC 89 09/01/2023   LDLCALC 97 01/03/2021   Lab Results  Component Value Date   TSH 1.410 09/01/2023   TSH 0.714 03/27/2017    Therapeutic Level Labs: No results found for: LITHIUM No results found for: VALPROATE No results found for: CBMZ   Screenings: AIMS    Flowsheet Row Office Visit from 01/22/2023 in [facility]  AIMS Total Score 0   GAD-7    Flowsheet Row Office Visit from 04/16/2023 in [facility] Office Visit from 01/22/2023 in  [facility] Video Visit from 08/04/2022 in [facility] Video Visit from 04/30/2022 in [facility] Video Visit from 11/06/2021 in [facility]  Total GAD-7 Score 0 0 9 7 12    PHQ2-9    Flowsheet Row Clinical Support from 07/21/2024 in [facility] Office Visit from 04/16/2023 in [facility] Office Visit from 01/22/2023 in [facility] Video Visit from 08/04/2022 in [facility] Video Visit from 04/30/2022 in [facility]  PHQ-2 Total Score 0 1 0 2 3  PHQ-9 Total Score -- 9 -- 12 12   Flowsheet Row Office Visit from 04/16/2023 in [facility] Office Visit from 01/22/2023 in [facility] Video Visit from 11/06/2021 in [facility]  C-SSRS RISK CATEGORY No Risk No Risk No Risk    Collaboration of Care: Collaboration of Care: Other Dr. Susen  Patient/Guardian was advised Release of Information must be obtained prior to any record release in order to collaborate their care with an outside provider. Patient/Guardian was advised if they have not already done so to contact the registration department to sign all necessary forms in order for us  to release information regarding their care.   Consent: Patient/Guardian gives verbal consent for treatment and assignment of benefits for services provided during this visit. Patient/Guardian expressed understanding and agreed to proceed.    Willia Genrich, MD 10/13/2024, 3:01 PM

## 2024-10-13 ENCOUNTER — Ambulatory Visit (HOSPITAL_COMMUNITY)

## 2024-10-13 ENCOUNTER — Ambulatory Visit (INDEPENDENT_AMBULATORY_CARE_PROVIDER_SITE_OTHER)

## 2024-10-13 VITALS — BP 137/91 | HR 105 | Ht 63.0 in | Wt 247.0 lb

## 2024-10-13 DIAGNOSIS — F319 Bipolar disorder, unspecified: Secondary | ICD-10-CM

## 2024-10-13 DIAGNOSIS — F17200 Nicotine dependence, unspecified, uncomplicated: Secondary | ICD-10-CM

## 2024-10-23 ENCOUNTER — Emergency Department (HOSPITAL_BASED_OUTPATIENT_CLINIC_OR_DEPARTMENT_OTHER)
Admission: EM | Admit: 2024-10-23 | Discharge: 2024-10-23 | Disposition: A | Discharge status: Home / Self Care | Attending: Emergency Medicine | Admitting: Emergency Medicine

## 2024-10-23 ENCOUNTER — Encounter (HOSPITAL_BASED_OUTPATIENT_CLINIC_OR_DEPARTMENT_OTHER): Payer: Self-pay | Admitting: Emergency Medicine

## 2024-10-23 ENCOUNTER — Emergency Department (HOSPITAL_BASED_OUTPATIENT_CLINIC_OR_DEPARTMENT_OTHER): Admitting: Radiology

## 2024-10-23 DIAGNOSIS — M25562 Pain in left knee: Secondary | ICD-10-CM | POA: Insufficient documentation

## 2024-10-23 MED ORDER — KETOROLAC TROMETHAMINE 30 MG/ML IJ SOLN
30.0000 mg | Freq: Once | INTRAMUSCULAR | Status: AC
  Administered 2024-10-23: 30 mg via INTRAMUSCULAR
  Filled 2024-10-23: qty 1

## 2024-10-23 MED ORDER — CELECOXIB 200 MG PO CAPS: 200.0000 mg | ORAL_CAPSULE | Freq: Two times a day (BID) | ORAL | 0 refills | Status: AC

## 2024-10-23 NOTE — ED Triage Notes (Signed)
 Left knee swelling x 2 week Pain now x couple days Denies injury

## 2024-10-23 NOTE — ED Provider Notes (Signed)
 " [place] EMERGENCY DEPARTMENT AT [facility] Provider Note   CSN: [id number] Arrival date & time: 10/23/24  1625     Patient presents with: Joint Swelling   Amy Sexton is a 38 y.o. female.   HPI   38 year old female presents emergency department with left knee pain and swelling for the past week.  Patient states that the swelling started a little over a week ago and the pain has been the past couple days.  This been no acute trauma or injury.  Denies any calf pain or swelling.  No numbness or weakness.  She has pain mainly with movement but denies any fever.  Prior to Admission medications  Medication Sig Start Date End Date Taking? Authorizing Provider  celecoxib  (CELEBREX ) 200 MG capsule Take 1 capsule (200 mg total) by mouth 2 (two) times daily. 10/23/24  Yes Bonnell Placzek, Roxie HERO, DO  ARIPiprazole  ER (ABILIFY  MAINTENA) 400 MG PRSY prefilled syringe Inject 400 mg into the muscle every 28 (twenty-eight) days. 04/21/24   Rainelle Pfeiffer, MD  ibuprofen  (ADVIL ,MOTRIN ) 200 MG tablet Take 200 mg by mouth every 6 (six) hours as needed.    [provider]    Allergies: Patient has no known allergies.    Review of Systems  Constitutional:  Negative for fever.  Respiratory:  Negative for shortness of breath.   Cardiovascular:  Negative for chest pain.  Musculoskeletal:        + Left knee pain and swelling, no calf pain/swelling  Skin:  Negative for rash.  Neurological:  Negative for numbness.    Updated Vital Signs BP 131/88 (BP Location: Right Arm)   Pulse 97   Temp 98.3 F (36.8 C)   Resp 18   LMP 10/03/2024 (Approximate)   SpO2 97%   Physical Exam Vitals and nursing note reviewed.  Constitutional:      Appearance: Normal appearance.  HENT:     Head: Normocephalic.     Mouth/Throat:     Mouth: Mucous membranes are moist.  Cardiovascular:     Rate and Rhythm: Normal rate.  Pulmonary:     Effort: Pulmonary effort is normal. No respiratory distress.   Musculoskeletal:     Comments: Left knee is mildly edematous, diffusely tender to palpation, strength intact, knee joint is stable, equal peripheral pulses, no calf pain or swelling  Skin:    General: Skin is warm.  Neurological:     Mental Status: She is alert and oriented to person, place, and time. Mental status is at baseline.  Psychiatric:        Mood and Affect: Mood normal.     (all labs ordered are listed, but only abnormal results are displayed) Labs Reviewed - No data to display  EKG: None  Radiology: DG Knee Complete 4 Views Left Result Date: 10/23/2024 CLINICAL DATA:  Pain and swelling. EXAM: LEFT KNEE - COMPLETE 4+ VIEW COMPARISON:  08/27/2017 FINDINGS: No evidence of fracture or dislocation. Sclerotic density in the distal femur is slightly more geographic than on prior exam and may represent an enchondroma. The alignment and joint spaces are normal. No erosive or bony destructive change. Small to moderate knee joint effusion. Generalized soft tissue edema. IMPRESSION: 1. Small to moderate knee joint effusion. Generalized soft tissue edema. 2. No acute osseous abnormality. 3. Sclerotic density in the distal femur is slightly more geographic than on prior exam and may represent an enchondroma. Electronically Signed   By: Andrea Gasman M.D.   On: 10/23/2024 18:24  Procedures   Medications Ordered in the ED  ketorolac  (TORADOL ) 30 MG/ML injection 30 mg (30 mg Intramuscular Given 10/23/24 2253)                                    Medical Decision Making Amount and/or Complexity of Data Reviewed Radiology: ordered.  Risk Prescription drug management.   38 year old female presents emergency department atraumatic left knee pain and swelling.  No calf pain/swelling or symptoms of DVT.  No fever.  Leg is neurovascularly intact.  X-ray shows small joint effusion.  Range of motion intact.  Doubt septic arthritis.  There is mention of a sclerotic area of the distal  femur, possible endochondroma.  This was mentioned and placed in the patient's discharge papers for outpatient follow-up.  Will treat symptomatically and refer outpatient.  Patient at this time appears safe and stable for discharge and close outpatient follow up. Discharge plan and strict return to ED precautions discussed, patient verbalizes understanding and agreement.     Final diagnoses:  Acute pain of left knee    ED Discharge Orders          Ordered    celecoxib  (CELEBREX ) 200 MG capsule  2 times daily        10/23/24 2302               Landan Fedie M, DO 10/23/24 2308  "

## 2024-10-23 NOTE — Discharge Instructions (Addendum)
 You have been seen and discharged from the emergency department.  Your knee x-ray was negative for fracture.  There is a small amount of fluid on the knee that could be from ligamental injury or degenerative changes.  There is also a sclerotic area on the distal part of the thigh bone that has been present before.  This should be monitored by an orthopedic doctor with repeat imaging.  Use Ace wrap as needed.  Take pain medicine as needed.  Follow-up with orthopedics for further evaluation and treatment.  Follow-up with your primary provider for further evaluation and further care. Take home medications as prescribed. If you have any worsening symptoms or further concerns for your health please return to an emergency department for further evaluation.

## 2024-10-26 ENCOUNTER — Ambulatory Visit (INDEPENDENT_AMBULATORY_CARE_PROVIDER_SITE_OTHER)

## 2024-10-26 ENCOUNTER — Encounter (HOSPITAL_COMMUNITY): Payer: Self-pay

## 2024-10-26 VITALS — BP 117/89 | HR 100 | Temp 98.2°F | Ht 63.0 in | Wt 250.8 lb

## 2024-10-26 DIAGNOSIS — F319 Bipolar disorder, unspecified: Secondary | ICD-10-CM

## 2024-10-26 NOTE — Progress Notes (Signed)
 Pt presented for Injection of Abilify  400 mg   in RIGHT deltoid. Pt tolerated injection well. Pt denies SI/HI/AVH. No additional concerns. Pt will return in 28 days.   SN [id number] EXP APR 2028 LOT [id number]  CJT-CMA Student

## 2024-11-23 ENCOUNTER — Ambulatory Visit (HOSPITAL_COMMUNITY)

## 2024-11-23 ENCOUNTER — Encounter (HOSPITAL_COMMUNITY): Payer: Self-pay

## 2024-11-23 VITALS — BP 142/83 | HR 95 | Ht 63.0 in | Wt 253.8 lb

## 2024-11-23 DIAGNOSIS — F200 Paranoid schizophrenia: Secondary | ICD-10-CM

## 2024-11-23 DIAGNOSIS — F411 Generalized anxiety disorder: Secondary | ICD-10-CM

## 2024-11-23 NOTE — Progress Notes (Signed)
 Patient presented to office for injection of Abilify  Maintena 400 mg. Patient received injection in Right Deltoid. Patient tolerated injection well. Patient denies SI/HI/AVH. Patient denies any concerns. Patient will return in 28 days.

## 2024-12-21 ENCOUNTER — Ambulatory Visit (HOSPITAL_COMMUNITY)

## 2025-01-12 ENCOUNTER — Encounter (HOSPITAL_COMMUNITY)
